# Patient Record
Sex: Male | Born: 1960 | Race: Black or African American | Hispanic: No | Marital: Single | Smoking: Former smoker
Health system: Southern US, Community
[De-identification: ages and names within clinical notes are randomized; demographics above are authoritative.]

## PROBLEM LIST (undated history)

## (undated) DIAGNOSIS — T8859XA Other complications of anesthesia, initial encounter: Secondary | ICD-10-CM

## (undated) DIAGNOSIS — E785 Hyperlipidemia, unspecified: Secondary | ICD-10-CM

## (undated) DIAGNOSIS — E119 Type 2 diabetes mellitus without complications: Secondary | ICD-10-CM

## (undated) DIAGNOSIS — J309 Allergic rhinitis, unspecified: Secondary | ICD-10-CM

## (undated) DIAGNOSIS — I10 Essential (primary) hypertension: Secondary | ICD-10-CM

## (undated) DIAGNOSIS — R0600 Dyspnea, unspecified: Secondary | ICD-10-CM

## (undated) DIAGNOSIS — E669 Obesity, unspecified: Secondary | ICD-10-CM

## (undated) DIAGNOSIS — J45909 Unspecified asthma, uncomplicated: Secondary | ICD-10-CM

## (undated) DIAGNOSIS — G894 Chronic pain syndrome: Principal | ICD-10-CM

## (undated) DIAGNOSIS — K219 Gastro-esophageal reflux disease without esophagitis: Secondary | ICD-10-CM

## (undated) DIAGNOSIS — J449 Chronic obstructive pulmonary disease, unspecified: Secondary | ICD-10-CM

## (undated) DIAGNOSIS — F1910 Other psychoactive substance abuse, uncomplicated: Secondary | ICD-10-CM

## (undated) DIAGNOSIS — M1990 Unspecified osteoarthritis, unspecified site: Secondary | ICD-10-CM

## (undated) HISTORY — DX: Chronic obstructive pulmonary disease, unspecified: J44.9

## (undated) HISTORY — DX: Essential (primary) hypertension: I10

## (undated) HISTORY — DX: Type 2 diabetes mellitus without complications: E11.9

## (undated) HISTORY — DX: Chronic pain syndrome: G89.4

## (undated) HISTORY — PX: PELVIC FRACTURE SURGERY: SHX119

## (undated) HISTORY — DX: Unspecified asthma, uncomplicated: J45.909

## (undated) HISTORY — DX: Obesity, unspecified: E66.9

## (undated) HISTORY — DX: Hyperlipidemia, unspecified: E78.5

## (undated) HISTORY — DX: Allergic rhinitis, unspecified: J30.9

## (undated) HISTORY — DX: Other psychoactive substance abuse, uncomplicated: F19.10

---

## 1989-07-30 HISTORY — PX: CHOLECYSTECTOMY: SHX55

## 2003-10-21 ENCOUNTER — Emergency Department (HOSPITAL_COMMUNITY): Admission: EM | Admit: 2003-10-21 | Discharge: 2003-10-21 | Payer: Self-pay | Admitting: Emergency Medicine

## 2006-04-19 ENCOUNTER — Emergency Department (HOSPITAL_COMMUNITY): Admission: AC | Admit: 2006-04-19 | Discharge: 2006-04-19 | Payer: Self-pay

## 2010-05-08 ENCOUNTER — Emergency Department (HOSPITAL_COMMUNITY): Admission: EM | Admit: 2010-05-08 | Discharge: 2010-05-08 | Payer: Self-pay | Admitting: Emergency Medicine

## 2010-06-06 ENCOUNTER — Emergency Department (HOSPITAL_COMMUNITY): Admission: EM | Admit: 2010-06-06 | Discharge: 2010-06-06 | Payer: Self-pay | Admitting: Emergency Medicine

## 2010-07-24 ENCOUNTER — Emergency Department (HOSPITAL_COMMUNITY)
Admission: EM | Admit: 2010-07-24 | Discharge: 2010-07-25 | Payer: Self-pay | Source: Home / Self Care | Admitting: Emergency Medicine

## 2010-09-25 ENCOUNTER — Emergency Department (HOSPITAL_COMMUNITY): Payer: Self-pay

## 2010-09-25 ENCOUNTER — Inpatient Hospital Stay (HOSPITAL_COMMUNITY)
Admission: EM | Admit: 2010-09-25 | Discharge: 2010-09-28 | DRG: 192 | Disposition: A | Discharge status: Home / Self Care | Payer: Self-pay | Attending: Internal Medicine | Admitting: Internal Medicine

## 2010-09-25 DIAGNOSIS — E663 Overweight: Secondary | ICD-10-CM | POA: Diagnosis present

## 2010-09-25 DIAGNOSIS — J441 Chronic obstructive pulmonary disease with (acute) exacerbation: Principal | ICD-10-CM | POA: Diagnosis present

## 2010-09-25 DIAGNOSIS — R079 Chest pain, unspecified: Secondary | ICD-10-CM

## 2010-09-25 DIAGNOSIS — R030 Elevated blood-pressure reading, without diagnosis of hypertension: Secondary | ICD-10-CM | POA: Diagnosis present

## 2010-09-25 DIAGNOSIS — J40 Bronchitis, not specified as acute or chronic: Secondary | ICD-10-CM

## 2010-09-25 DIAGNOSIS — Z6831 Body mass index (BMI) 31.0-31.9, adult: Secondary | ICD-10-CM

## 2010-09-25 DIAGNOSIS — Z87891 Personal history of nicotine dependence: Secondary | ICD-10-CM

## 2010-09-25 DIAGNOSIS — F1410 Cocaine abuse, uncomplicated: Secondary | ICD-10-CM | POA: Diagnosis present

## 2010-09-25 LAB — RAPID URINE DRUG SCREEN, HOSP PERFORMED
Amphetamines: NOT DETECTED
Barbiturates: NOT DETECTED
Cocaine: POSITIVE — AB
Opiates: NOT DETECTED
Tetrahydrocannabinol: NOT DETECTED

## 2010-09-25 LAB — POCT CARDIAC MARKERS
CKMB, poc: 1.9 ng/mL (ref 1.0–8.0)
Myoglobin, poc: 96 ng/mL (ref 12–200)

## 2010-09-25 LAB — BASIC METABOLIC PANEL
Calcium: 8.6 mg/dL (ref 8.4–10.5)
Creatinine, Ser: 1.21 mg/dL (ref 0.4–1.5)
GFR calc Af Amer: >60 mL/min (ref >60)
GFR calc non Af Amer: >60 mL/min (ref >60)
Sodium: 140 mEq/L (ref 135–145)

## 2010-09-25 LAB — CK TOTAL AND CKMB (NOT AT ARMC)
CK, MB: 3.1 ng/mL (ref 0.3–4.0)
Total CK: 443 U/L — ABNORMAL HIGH (ref 7–232)

## 2010-09-25 LAB — URINALYSIS, ROUTINE W REFLEX MICROSCOPIC
Ketones, ur: 40 mg/dL — AB
Protein, ur: NEGATIVE mg/dL
Urine Glucose, Fasting: NEGATIVE mg/dL
pH: 7 (ref 5.0–8.0)

## 2010-09-25 LAB — DIFFERENTIAL
Basophils Absolute: 0 10*3/uL (ref 0.0–0.1)
Basophils Relative: 0 % (ref 0–1)
Eosinophils Absolute: 0.7 10*3/uL (ref 0.0–0.7)
Eosinophils Relative: 9 % — ABNORMAL HIGH (ref 0–5)
Monocytes Absolute: 0.9 10*3/uL (ref 0.1–1.0)
Neutro Abs: 3.8 10*3/uL (ref 1.7–7.7)

## 2010-09-25 LAB — CBC
Hemoglobin: 16.2 g/dL (ref 13.0–17.0)
MCHC: 33.4 g/dL (ref 30.0–36.0)
Platelets: 272 10*3/uL (ref 150–400)
RDW: 14.1 % (ref 11.5–15.5)

## 2010-09-25 LAB — POCT I-STAT 3, ART BLOOD GAS (G3+)
TCO2: 28 mmol/L (ref 0–100)
pCO2 arterial: 43.9 mmHg (ref 35.0–45.0)
pH, Arterial: 7.386 (ref 7.350–7.450)

## 2010-09-25 LAB — CARDIAC PANEL(CRET KIN+CKTOT+MB+TROPI)
CK, MB: 2.2 ng/mL (ref 0.3–4.0)
Relative Index: 0.6 (ref 0.0–2.5)

## 2010-09-25 LAB — BRAIN NATRIURETIC PEPTIDE: Pro B Natriuretic peptide (BNP): <30 pg/mL (ref 0.0–100.0)

## 2010-09-25 MED ORDER — IOHEXOL 300 MG/ML  SOLN
100.0000 mL | Freq: Once | INTRAMUSCULAR | Status: AC | PRN
  Administered 2010-09-25: 75 mL via INTRAVENOUS

## 2010-09-26 LAB — EXPECTORATED SPUTUM ASSESSMENT W GRAM STAIN, RFLX TO RESP C

## 2010-09-26 LAB — CBC
MCV: 87.5 fL (ref 78.0–100.0)
Platelets: 277 10*3/uL (ref 150–400)
RBC: 5.06 MIL/uL (ref 4.22–5.81)
RDW: 13.8 % (ref 11.5–15.5)
WBC: 7 10*3/uL (ref 4.0–10.5)

## 2010-09-26 LAB — LEGIONELLA ANTIGEN, URINE: Legionella Antigen, Urine: NEGATIVE

## 2010-09-26 LAB — SEDIMENTATION RATE: Sed Rate: 2 mm/hr (ref 0–16)

## 2010-09-26 LAB — BASIC METABOLIC PANEL
BUN: 8 mg/dL (ref 6–23)
CO2: 25 mEq/L (ref 19–32)
Calcium: 8.3 mg/dL — ABNORMAL LOW (ref 8.4–10.5)
Chloride: 106 mEq/L (ref 96–112)
Creatinine, Ser: 1.14 mg/dL (ref 0.4–1.5)
GFR calc Af Amer: >60 mL/min (ref >60)
Glucose, Bld: 150 mg/dL — ABNORMAL HIGH (ref 70–99)

## 2010-09-26 LAB — LIPID PANEL
HDL: 43 mg/dL (ref >39)
LDL Cholesterol: 88 mg/dL (ref 0–99)
Triglycerides: 40 mg/dL (ref <150)

## 2010-09-26 LAB — ANGIOTENSIN CONVERTING ENZYME: Angiotensin-Converting Enzyme: 15 U/L (ref 8–52)

## 2010-09-26 LAB — CARDIAC PANEL(CRET KIN+CKTOT+MB+TROPI)
Total CK: 352 U/L — ABNORMAL HIGH (ref 7–232)
Troponin I: 0.02 ng/mL (ref 0.00–0.06)

## 2010-09-26 LAB — HIV ANTIBODY (ROUTINE TESTING W REFLEX): HIV: NONREACTIVE

## 2010-09-27 DIAGNOSIS — I517 Cardiomegaly: Secondary | ICD-10-CM

## 2010-09-27 LAB — BASIC METABOLIC PANEL
CO2: 27 mEq/L (ref 19–32)
Chloride: 107 mEq/L (ref 96–112)
Creatinine, Ser: 1.12 mg/dL (ref 0.4–1.5)
GFR calc Af Amer: >60 mL/min (ref >60)
Potassium: 3.6 mEq/L (ref 3.5–5.1)

## 2010-09-27 LAB — CBC
Hemoglobin: 13.4 g/dL (ref 13.0–17.0)
MCH: 28.8 pg (ref 26.0–34.0)
MCV: 88.6 fL (ref 78.0–100.0)
Platelets: 288 10*3/uL (ref 150–400)
RBC: 4.66 MIL/uL (ref 4.22–5.81)
WBC: 14.1 10*3/uL — ABNORMAL HIGH (ref 4.0–10.5)

## 2010-09-28 DIAGNOSIS — R079 Chest pain, unspecified: Secondary | ICD-10-CM

## 2010-09-28 DIAGNOSIS — J40 Bronchitis, not specified as acute or chronic: Secondary | ICD-10-CM

## 2010-09-28 LAB — ANTI-NEUTROPHIL ANTIBODY

## 2010-09-28 LAB — T4, FREE: Free T4: 0.78 ng/dL — ABNORMAL LOW (ref 0.80–1.80)

## 2010-10-08 NOTE — Discharge Summary (Signed)
[name], [name] NO.:  [id number]  MEDICAL RECORD NO.:  [id number]           PATIENT TYPE:  E  LOCATION:  [facility]                         FACILITY:  [facility]  PHYSICIAN:  Doneen Poisson, MD     DATE OF BIRTH:  07/11/1961  DATE OF ADMISSION:  09/25/2010 DATE OF DISCHARGE:  09/28/2010                              DISCHARGE SUMMARY   DISCHARGE DIAGNOSES: 1. Presumed chronic obstructive pulmonary disease exacerbation. 2. Overweight BMI 31. 3. Substance abuse, cocaine. 4. Borderline hypertension.  DISCHARGE MEDICATIONS: 1. AeroChamber InspirEase. 2. Albuterol 2.5 mg per 3 mL nebulizer solution q.4 h. as needed. 3. Aspirin 81 mg daily by mouth. 4. Zithromax 250 mg p.o. daily for 2 days. 5. Atrovent 0.2 mcg/mL inhaled every 6 hours. 6. Prednisone taper 4 pills daily for 1 day; then 3 tablets on day 2     through 4; 2 tablets day 5 through 7, and one tablet on day 8     through 11.  DISPOSITION AND FOLLOWUP:  [name] was discharged from [facility] in stable and improved condition.  He will follow up with Dr. Threasa Beards on October 21, 2010, at 10:15 a.m. in the [facility].  He should have a pulmonary function test scheduled and had his respiratory status reassess for further cocaine or cigarette  use.  CONSULTS: Cardiology.  [name] had chest pain, T wave changes, and a  troponin leak.  The Cardiology thought this is secondary to cocaine and  recommended medical management alone without further workup.  PROCEDURES: 1. Chest x-ray 2-view.  Impression:  Chronic interstitial coarsening     with no acute findings. 2. CT angiogram.  No evidence of PE, bilateral peribronchial     thickening and shotty bilateral hilar and mediastinal lymph nodes     likely reactive etiology. 3. Echo.  [name] had mild LVH, mild grade 2 diastolic     dysfunction, and had an EF of 60% to 65%.  ADMISSION HISTORY AND PHYSICAL:  [name] is a 50 year old  African American man without significant medical history who presented with a 4- month history of shortness of breath, cough and chest pain that started after the death of his mother and cleaning her house which was full of  mold and dust.  The night prior to admission, he could not sleep while  lying flat secondary to shortness of breath and needed to sleep upright. He stated that he coughs up yellow sputum which sometimes was or had  streaks of blood.  He felt feverish with a 99 degree temperature whenever  he had a coughing spell.  Chest pain was associated with coughing.   Quality of the chest pain was pressure like in nature located at the mid  sternum and radiating to the lower back and right arm as a 9/10 pain.   There were no exacerbating or alleviating factors.  He endorsed nausea,  vomiting, and diaphoresis when he had the chest pain.  He denied any recent travels or sick contacts.  He reports going to the emergency  department  several times in the past few months and was given an inhaler which helped his shortness of breath.  ADMISSION PHYSICAL EXAMINATION: VITAL SIGNS:  Temperature 98.6, blood pressure 130/84, pulse 89,  respiratory rate 22, and oxygen 98% on room air. GENERAL:  No acute distress. EYES:  Pupils equally round and reactive to light.  Extraocular movements intact. NECK:  No lymphadenopathy. RESPIRATIONS:  Mild diffuse wheezes bilaterally, coarse breath sounds. No axillary muscle usage or crackles. CARDIOVASCULAR:  Distant heart sounds.  No murmurs, rubs, or gallops. GI:  Soft, nontender, nondistended.  Normoactive bowel sounds. EXTREMITIES:  No edema or cyanosis. SKIN:  No rash. NEURO:  Nonfocal.  Alert and oriented x 3.  Motor 5/5 in all extremities. Intact sensation to light touch. PSYCH:  Appropriate.  ADMISSION LABS:  CK 443, CK-MB 3.1, troponin 0.14 trended to 0.3.  BNP  less than 30.  ABG, pH 7.6, PCO2 44, PO2 69, bicarb 26.  Ethanol less  than 5.   UA positive ketones, trace blood.  UDS, positive cocaine.   BMET 143, 107, 24, 9, 1.21, 89.  CBC, 8.0, 16.2, 49, 272.  HOSPITAL COURSE BY PROBLEM: 1. Dyspnea.  [name] had an increase in shortness of breath on     admission.  He was given between 2 and 3 liters oxygen by     nasal cannula, which kept his oxygen saturation above 93%.  Due      to the initial concern for PE considering his chest pain, a CT      angiogram was ordered and showed shotty hilar or mediastinal     lymphadenopathy.  A chest x-ray showed some coarse interstitial     thickening.  He was started on albuterol and Atrovent nebulizer.       On hospital day 2, he was started on  Advair.  Initially, he      received Solu-Medrol and on hospital day 3, was changed to     a prednisone taper.  He felt he was improved and was active,      walking around the ward without oxygen.  Per the nurse, when      walking, his oxygen saturation did not drop below 93%.  This      condition is likely secondary to COPD given his 30-pack-year     history.  Of note, his brother died of complications of a      double lung transplant for sarcoidosis at the age of 32.  2. Cough.  [name] had severe cough upon entering the emergency     department.  He was provided dexamethorphan as needed. He     had no more "cough attacks" after hospital day 1.  3. Chest pain.  [name] initially had T wave inversion in leads     II, III and aVF as well as V4 through 6.  He had a classic     angina history, and had a troponin leak, although this     was in the setting of a cocaine positive urinary drug screen.     Considering this we ordered an echo which found no ischemic     changes, mild grade 2 diastolic dysfunction, mild LVH and an      EF of 60% to 65%.  He had no chest pain after admission.  4. Substance abuse.  [name] continues to deny use of cocaine.     This is in the face of a positive urinary drug screen  for cocaine.     He does state that he  hangs around friends who smoke.  He     was provided substance abuse counseling on day 2.  DISCHARGE VITAL SIGNS:  T 98.3, blood pressure 122/61, pulse 84,  respirations 20, O2 sat 95% on room air.   ______________________________ Antoine Primas, MS IV   ______________________________ Doneen Poisson, MD   SZ/MEDQ  D:  09/28/2010  T:  09/29/2010  Job:  [id number]  Electronically Signed by Darnelle Maffucci  on 10/01/2010 03:35:15 PM Electronically Signed by Doneen Poisson  on 10/08/2010 08:25:02 AM

## 2010-10-11 LAB — BASIC METABOLIC PANEL
Chloride: 105 mEq/L (ref 96–112)
GFR calc non Af Amer: 56 mL/min — ABNORMAL LOW (ref >60)
Potassium: 3.7 mEq/L (ref 3.5–5.1)
Sodium: 140 mEq/L (ref 135–145)

## 2010-10-11 LAB — CBC
HCT: 45.8 % (ref 39.0–52.0)
Hemoglobin: 14.7 g/dL (ref 13.0–17.0)
MCV: 90.7 fL (ref 78.0–100.0)
RBC: 5.05 MIL/uL (ref 4.22–5.81)
RDW: 13.8 % (ref 11.5–15.5)
WBC: 7.4 10*3/uL (ref 4.0–10.5)

## 2010-10-11 LAB — DIFFERENTIAL
Basophils Absolute: 0 10*3/uL (ref 0.0–0.1)
Eosinophils Relative: 5 % (ref 0–5)
Lymphocytes Relative: 65 % — ABNORMAL HIGH (ref 12–46)
Lymphs Abs: 4.8 10*3/uL — ABNORMAL HIGH (ref 0.7–4.0)
Monocytes Absolute: 0.5 10*3/uL (ref 0.1–1.0)
Monocytes Relative: 7 % (ref 3–12)
Neutro Abs: 1.7 10*3/uL (ref 1.7–7.7)

## 2010-10-12 LAB — URINALYSIS, ROUTINE W REFLEX MICROSCOPIC
Glucose, UA: NEGATIVE mg/dL
Hgb urine dipstick: NEGATIVE
Ketones, ur: 15 mg/dL — AB
Nitrite: NEGATIVE
Protein, ur: NEGATIVE mg/dL
Specific Gravity, Urine: 1.025 (ref 1.005–1.030)
Urobilinogen, UA: 0.2 mg/dL (ref 0.0–1.0)
pH: 6 (ref 5.0–8.0)

## 2010-10-12 LAB — BASIC METABOLIC PANEL WITH GFR
Calcium: 8.5 mg/dL (ref 8.4–10.5)
Creatinine, Ser: 1.18 mg/dL (ref 0.4–1.5)
GFR calc Af Amer: >60 mL/min (ref >60)
GFR calc non Af Amer: >60 mL/min (ref >60)
Glucose, Bld: 109 mg/dL — ABNORMAL HIGH (ref 70–99)
Sodium: 139 meq/L (ref 135–145)

## 2010-10-12 LAB — DIFFERENTIAL
Basophils Absolute: 0 10*3/uL (ref 0.0–0.1)
Basophils Relative: 0 % (ref 0–1)
Eosinophils Absolute: 0.5 10*3/uL (ref 0.0–0.7)
Eosinophils Relative: 5 % (ref 0–5)
Lymphocytes Relative: 28 % (ref 12–46)
Lymphs Abs: 2.6 10*3/uL (ref 0.7–4.0)
Monocytes Absolute: 1 10*3/uL (ref 0.1–1.0)
Monocytes Relative: 11 % (ref 3–12)
Neutro Abs: 5.3 K/uL (ref 1.7–7.7)
Neutrophils Relative %: 56 % (ref 43–77)

## 2010-10-12 LAB — CBC
HCT: 46 % (ref 39.0–52.0)
Hemoglobin: 15 g/dL (ref 13.0–17.0)
MCH: 29.9 pg (ref 26.0–34.0)
MCHC: 32.6 g/dL (ref 30.0–36.0)
MCV: 91.8 fL (ref 78.0–100.0)
Platelets: 277 K/uL (ref 150–400)
RBC: 5.01 MIL/uL (ref 4.22–5.81)
RDW: 14.1 % (ref 11.5–15.5)
WBC: 9.5 10*3/uL (ref 4.0–10.5)

## 2010-10-12 LAB — BASIC METABOLIC PANEL
BUN: 10 mg/dL (ref 6–23)
CO2: 29 mEq/L (ref 19–32)
Chloride: 106 mEq/L (ref 96–112)
Potassium: 3.5 mEq/L (ref 3.5–5.1)

## 2010-10-21 ENCOUNTER — Ambulatory Visit (INDEPENDENT_AMBULATORY_CARE_PROVIDER_SITE_OTHER): Payer: Self-pay | Admitting: Internal Medicine

## 2010-10-21 ENCOUNTER — Ambulatory Visit: Payer: Self-pay | Admitting: Licensed Clinical Social Worker

## 2010-10-21 ENCOUNTER — Encounter: Payer: Self-pay | Admitting: Internal Medicine

## 2010-10-21 DIAGNOSIS — J45909 Unspecified asthma, uncomplicated: Secondary | ICD-10-CM | POA: Insufficient documentation

## 2010-10-21 DIAGNOSIS — E669 Obesity, unspecified: Secondary | ICD-10-CM

## 2010-10-21 DIAGNOSIS — F1910 Other psychoactive substance abuse, uncomplicated: Secondary | ICD-10-CM

## 2010-10-21 DIAGNOSIS — J449 Chronic obstructive pulmonary disease, unspecified: Secondary | ICD-10-CM

## 2010-10-21 DIAGNOSIS — Z598 Other problems related to housing and economic circumstances: Secondary | ICD-10-CM

## 2010-10-21 DIAGNOSIS — I10 Essential (primary) hypertension: Secondary | ICD-10-CM | POA: Insufficient documentation

## 2010-10-21 HISTORY — DX: Unspecified asthma, uncomplicated: J45.909

## 2010-10-21 MED ORDER — IPRATROPIUM BROMIDE HFA 17 MCG/ACT IN AERS: 2.0000 | INHALATION_SPRAY | Freq: Four times a day (QID) | RESPIRATORY_TRACT | Status: DC

## 2010-10-21 MED ORDER — ALBUTEROL SULFATE (2.5 MG/3ML) 0.083% IN NEBU: 2.5000 mg | INHALATION_SOLUTION | RESPIRATORY_TRACT | Status: DC | PRN

## 2010-10-21 NOTE — Assessment & Plan Note (Signed)
Has not used smoking or cocaine after discharge and provided counseling as well as Secretary/administrator.

## 2010-10-21 NOTE — Progress Notes (Signed)
  Subjective:    Patient ID: [name], male    DOB: 1960-08-29, 50 y.o.   MRN: [id number]  HPI Patient is a 50 years old male with past medical history  as outlined here who comes to the Clinic for recent hospital f/u. He was admitted to hospital on 2/25-2/28/2012 for COPD exacerbation. After discharge he has been doing well, had only one episode of dyspnea lasting about 30 minutes. Has no fever, chill, chest pain, hemoptysis, abdominal pain, nausea, vomiting, diarrhea, melena, dysuria, significant weight change. Has quitted smoking and cocaine use. But haven't got bronchodilators for financial reason.     Review of Systems Per HPI.  Current Outpatient Medications Current Outpatient Prescriptions  Medication Sig Dispense Refill  . aspirin 81 MG tablet Take 81 mg by mouth daily.          Allergies Review of patient's allergies indicates no known allergies.  Past Medical History  Diagnosis Date  . COPD (chronic obstructive pulmonary disease)   . Obesity (BMI 30-39.9)   . Substance abuse     cocaine    No past surgical history on file.     Objective:   Physical Exam General: Vital signs reviewed and noted. Well-developed,well-nourished,in no acute distress; alert,appropriate and cooperative throughout examination. Head: normocephalic, atraumatic. Neck: No deformities, masses, or tenderness noted. Lungs: Normal respiratory effort. Clear to auscultation BL without crackles or wheezes.  Heart: RRR. S1 and S2 normal without gallop, murmur, or rubs.  Abdomen: BS normoactive. Soft, Nondistended, non-tender.  No masses or organomegaly. Extremities: No pretibial edema.         Assessment & Plan:

## 2010-10-21 NOTE — Patient Instructions (Signed)
Please take all your medications as instructed in your instructions.   Please talk our social worker after this visit for your financial help.  Please stop smoking and cocaine.   Smoking Cessation This document explains the best ways for you to quit smoking and new treatments to help. It lists new medicines that can double or triple your chances of quitting and quitting for good. It also considers ways to avoid relapses and concerns you may have about quitting, including weight gain. NICOTINE: A POWERFUL ADDICTION If you have tried to quit smoking, you know how hard it can be. It is hard because nicotine is a very addictive drug. For some people, it can be as addictive as heroin or cocaine. Usually, people make 2 or 3 tries, or more, before finally being able to quit. Each time you try to quit, you can learn about what helps and what hurts. Quitting takes hard work and a lot of effort, but you can quit smoking. QUITTING SMOKING IS ONE OF THE MOST IMPORTANT THINGS YOU WILL EVER DO:  You will live longer, feel better, and live better.   The impact on your body of quitting smoking is felt almost immediately:   Within 20 minutes, blood pressure decreases. Pulse returns to its normal level.   After 8 hours, carbon monoxide levels in the blood return to normal. Oxygen level increases.   After 24 hours, chance of heart attack starts to decrease. Breath, hair, and body stop smelling like smoke.   After 48 hours, damaged nerve endings begin to recover. Sense of taste and smell improve.   After 72 hours, the body is virtually free of nicotine. Bronchial tubes relax and breathing becomes easier.   After 2 to 12 weeks, lungs can hold more air. Exercise becomes easier and circulation improves.   Quitting will lower your chance of having a heart attack, stroke, cancer, or lung disease:   After 1 year, the risk of coronary heart disease is cut in half.   After 5 years, the risk of stroke falls to  the same as a nonsmoker.   After 10 years, the risk of lung cancer is cut in half and the risk of other cancers decreases significantly.   After 15 years, the risk of coronary heart disease drops, usually to the level of a nonsmoker.   If you are pregnant, quitting smoking will improve your chances of having a healthy baby.   The people you live with, especially your children, will be healthier.   You will have extra money to spend on things other than cigarettes.  FIVE KEYS TO QUITTING Studies have shown that these 5 steps will help you quit smoking and quit for good. You have the best chances of quitting if you use them together: 1. Get ready.  2. Get support and encouragement.  3. Learn new skills and behaviors.  4. Get medicine to reduce your nicotine addiction and use it correctly.  5. Be prepared for relapse or difficult situations. Be determined to continue trying to quit, even if you do not succeed at first.  1. GET READY  Set a quit date.   Change your environment.   Get rid of ALL cigarettes, ashtrays, matches, and lighters in your home, car, and place of work.   Do not let people smoke in your home.   Review your past attempts to quit. Think about what worked and what did not.   Once you quit, do not smoke. NOT EVEN A  PUFF!  2. GET SUPPORT AND ENCOURAGEMENT Studies have shown that you have a better chance of being successful if you have help. You can get support in many ways.  Tell your family, friends, and coworkers that you are going to quit and need their support. Ask them not to smoke around you.   Talk to your caregivers (doctor, dentist, nurse, pharmacist, psychologist, and/or smoking counselor).   Get individual, group, or telephone counseling and support. The more counseling you have, the better your chances are of quitting. Programs are available at [employer] and health centers. Call your local health department for information about programs in your  area.   Spiritual beliefs and practices may help some smokers quit.   Quit meters are Photographer that keep track of quit statistics, such as amount of "quit-time," cigarettes not smoked, and money saved.   Many smokers find one or more of the many self-help books available useful in helping them quit and stay off tobacco.  3. LEARN NEW SKILLS AND BEHAVIORS  Try to distract yourself from urges to smoke. Talk to someone, go for a walk, or occupy your time with a task.   When you first try to quit, change your routine. Take a different route to work. Drink tea instead of coffee. Eat breakfast in a different place.   Do something to reduce your stress. Take a hot bath, exercise, or read a book.   Plan something enjoyable to do every day. Reward yourself for not smoking.   Explore interactive web-based programs that specialize in helping you quit.  4. GET MEDICINE AND USE IT CORRECTLY Medicines can help you stop smoking and decrease the urge to smoke. Combining medicine with the above behavioral methods and support can quadruple your chances of successfully quitting smoking. The U.[place]. Food and Drug Administration (FDA) has approved 7 medicines to help you quit smoking. These medicines fall into 3 categories.  Nicotine replacement therapy (delivers nicotine to your body without the negative effects and risks of smoking):   Nicotine gum: Available over-the-counter.   Nicotine lozenges: Available over-the-counter.   Nicotine inhaler: Available by prescription.   Nicotine nasal spray: Available by prescription.   Nicotine skin patches (transdermal): Available by prescription and over-the-counter.   Antidepressant medicine (helps people abstain from smoking, but how this works is unknown):   Bupropion sustained-release (SR) tablets: Available by prescription.   Nicotinic receptor partial agonist (simulates the effect of nicotine in your brain):    Varenicline tartrate tablets: Available by prescription.   Ask your caregiver for advice about which medicines to use and how to use them. Carefully read the information on the package.   Everyone who is trying to quit may benefit from using a medicine. If you are pregnant or trying to become pregnant, nursing an infant, you are under age 66, or you smoke fewer than 10 cigarettes per day, talk to your caregiver before taking any nicotine replacement medicines.   You should stop using a nicotine replacement product and call your caregiver if you experience nausea, dizziness, weakness, vomiting, fast or irregular heartbeat, mouth problems with the lozenge or gum, or redness or swelling of the skin around the patch that does not go away.   Do not use any other product containing nicotine while using a nicotine replacement product.   Talk to your caregiver before using these products if you have diabetes, heart disease, asthma, stomach ulcers, you had a recent heart attack, you have  high blood pressure that is not controlled with medicine, a history of irregular heartbeat, or you have been prescribed medicine to help you quit smoking.  5. BE PREPARED FOR RELAPSE OR DIFFICULT SITUATIONS  Most relapses occur within the first 3 months after quitting. Do not be discouraged if you start smoking again. Remember, most people try several times before they finally quit.   You may have symptoms of withdrawal because your body is used to nicotine. You may crave cigarettes, be irritable, feel very hungry, cough often, get headaches, or have difficulty concentrating.   The withdrawal symptoms are only temporary. They are strongest when you first quit, but they will go away within 10 to 14 days.  Here are some difficult situations to watch for:  Alcohol. Avoid drinking alcohol. Drinking lowers your chances of successfully quitting.   Caffeine. Try to reduce the amount of caffeine you consume. It also lowers  your chances of successfully quitting.   Other smokers. Being around smoking can make you want to smoke. Avoid smokers.   Weight gain. Many smokers will gain weight when they quit, usually less than 10 pounds. Eat a healthy diet and stay active. Do not let weight gain distract you from your main goal, quitting smoking. Some medicines that help you quit smoking may also help delay weight gain. You can always lose the weight gained after you quit.   Bad mood or depression. There are a lot of ways to improve your mood other than smoking.  If you are having problems with any of these situations, talk to your caregiver. SPECIAL SITUATIONS OR CONDITIONS Studies suggest that everyone can quit smoking. Your situation or condition can give you a special reason to quit.  Pregnant women/New mothers: By quitting, you protect your baby's health and your own.   Hospitalized patients: By quitting, you reduce health problems and help healing.   Heart attack patients: By quitting, you reduce your risk of a second heart attack.   Lung, head, and neck cancer patients: By quitting, you reduce your chance of a second cancer.   Parents of children and adolescents: By quitting, you protect your children from illnesses caused by secondhand smoke.  QUESTIONS TO THINK ABOUT Think about the following questions before you try to stop smoking. You may want to talk about your answers with your caregiver.  Why do you want to quit?   If you tried to quit in the past, what helped and what did not?   What will be the most difficult situations for you after you quit? How will you plan to handle them?   Who can help you through the tough times? Your family? Friends? Caregiver?   What pleasures do you get from smoking? What ways can you still get pleasure if you quit?  Here are some questions to ask your caregiver:  How can you help me to be successful at quitting?   What medicine do you think would be best for me  and how should I take it?   What should I do if I need more help?   What is smoking withdrawal like? How can I get information on withdrawal?  Quitting takes hard work and a lot of effort, but you can quit smoking. FOR MORE INFORMATION Smokefree.gov (http://www.davis-sullivan.com/) provides free, accurate, evidence-based information and professional assistance to help support the immediate and long-term needs of people trying to quit smoking. Document Released: 07/12/2001 Document Re-Released: 01/05/2010 [facility] Patient Information 2011 [place], [place].

## 2010-10-21 NOTE — Assessment & Plan Note (Addendum)
Has only one episode of SOB, not using bronchodilators for financial cost. Will refer to CSW for this and give him one sample of albuterol. No change his medications.

## 2010-10-21 NOTE — Progress Notes (Signed)
Soc. Work.  30 minutes.  Referred by Dr. Threasa Beards for med assist, inhalors. Met with [name] in my office and find a very pleasant gentleman who lives alone in a home that was once his deceased mother's.  He has not worked in 6 years but his sister allows him to live in the home rent free and he also receives Nature conservation officer.   He received a ride from his uncle today for this appointment.   I explained to [name] that he needed to connect with [employer] today for sliding scale discount, orange card and county card.  I also gave him information about MAP which he tried to access before getting Dr. Threasa Beards as primary care physician.  He will try again now that he is established here in [facility].    I walked the patient over to [facility] and signed him in so he can consult with her and get started with the financial process.

## 2010-11-10 ENCOUNTER — Inpatient Hospital Stay (INDEPENDENT_AMBULATORY_CARE_PROVIDER_SITE_OTHER)
Admission: RE | Admit: 2010-11-10 | Discharge: 2010-11-10 | Disposition: A | Discharge status: Home / Self Care | Payer: Self-pay | Source: Ambulatory Visit | Attending: Emergency Medicine | Admitting: Emergency Medicine

## 2010-11-10 DIAGNOSIS — J45909 Unspecified asthma, uncomplicated: Secondary | ICD-10-CM

## 2010-11-22 ENCOUNTER — Encounter (HOSPITAL_COMMUNITY): Payer: Self-pay | Admitting: Radiology

## 2010-11-22 ENCOUNTER — Emergency Department (HOSPITAL_COMMUNITY)
Admission: EM | Admit: 2010-11-22 | Discharge: 2010-11-22 | Disposition: A | Discharge status: Home / Self Care | Payer: Self-pay | Attending: Emergency Medicine | Admitting: Emergency Medicine

## 2010-11-22 ENCOUNTER — Emergency Department (HOSPITAL_COMMUNITY): Payer: Self-pay

## 2010-11-22 ENCOUNTER — Encounter: Payer: Self-pay | Admitting: Internal Medicine

## 2010-11-22 ENCOUNTER — Inpatient Hospital Stay (HOSPITAL_COMMUNITY)
Admission: EM | Admit: 2010-11-22 | Discharge: 2010-11-24 | DRG: 192 | Disposition: A | Discharge status: Home / Self Care | Payer: Self-pay | Attending: Infectious Diseases | Admitting: Infectious Diseases

## 2010-11-22 DIAGNOSIS — J189 Pneumonia, unspecified organism: Secondary | ICD-10-CM | POA: Insufficient documentation

## 2010-11-22 DIAGNOSIS — J441 Chronic obstructive pulmonary disease with (acute) exacerbation: Principal | ICD-10-CM | POA: Diagnosis present

## 2010-11-22 DIAGNOSIS — J45909 Unspecified asthma, uncomplicated: Secondary | ICD-10-CM | POA: Insufficient documentation

## 2010-11-22 DIAGNOSIS — Z23 Encounter for immunization: Secondary | ICD-10-CM

## 2010-11-22 DIAGNOSIS — Z7982 Long term (current) use of aspirin: Secondary | ICD-10-CM

## 2010-11-22 DIAGNOSIS — R509 Fever, unspecified: Secondary | ICD-10-CM | POA: Insufficient documentation

## 2010-11-22 LAB — DIFFERENTIAL
Eosinophils Relative: 0 % (ref 0–5)
Lymphocytes Relative: 16 % (ref 12–46)
Lymphs Abs: 2 10*3/uL (ref 0.7–4.0)
Monocytes Absolute: 1.5 10*3/uL — ABNORMAL HIGH (ref 0.1–1.0)

## 2010-11-22 LAB — CBC
HCT: 41.5 % (ref 39.0–52.0)
MCH: 29.8 pg (ref 26.0–34.0)
MCHC: 33.5 g/dL (ref 30.0–36.0)
MCV: 89.1 fL (ref 78.0–100.0)
RDW: 14 % (ref 11.5–15.5)

## 2010-11-22 LAB — RAPID URINE DRUG SCREEN, HOSP PERFORMED
Amphetamines: NOT DETECTED
Barbiturates: NOT DETECTED
Benzodiazepines: NOT DETECTED
Opiates: NOT DETECTED

## 2010-11-22 LAB — BASIC METABOLIC PANEL
BUN: 9 mg/dL (ref 6–23)
Chloride: 111 mEq/L (ref 96–112)
GFR calc Af Amer: >60 mL/min (ref >60)
Potassium: 4 mEq/L (ref 3.5–5.1)

## 2010-11-22 NOTE — H&P (Signed)
Hospital Admission Note Date: 11/22/2010  Patient name: [name] Medical record number: [id number] Date of birth: 1961/07/08 Age: 50 y.o. Gender: male PCP: Jackson Latino, MD  Medical Service: Internal Medicine Teaching Service  Attending physician:  Dr. Lina Sayre   Pager: Resident [phone]): Dr. Scot Dock    Pager: [phone] Resident (R1): Dr. Allena Katz    Pager: [phone]  Chief Complaint:coughing  History of Present Illness: [name] is a 29 yoman without significant medical history who presented with a 6-   month history of shortness of breath, cough.  He was recently admitted for a COPD exacerbation on 09/25/2010.  After hospital discharge, he completed his 5 day course of Azithromax and prednisone tapering dose.  He reports that he couldn't not afford his inhalers because there was a problem with paperworks for the orange card.  He has cough that is intermittent, and that the day prior to admission, his cough and SOB became worse so he went to [facility] ED yesterday 11/21/10, was given IV abx and breathing treatment and then was sent home. He coughs up yellow phlegm without any blood.  He has chills but denies any fever, measure temp 99.7  He denies any   recent travels or sick contacts.  Admits to nausea but denies any vomiting.  He was given a prescription for azithromycin from [facility], however due to financial strain was unable to fill it. Patient states that he has an appt with [facility] dept for registration this Thursday. He denies headache, changes in urinary or bowel habits.   Current Outpatient Prescriptions  Medication Sig Dispense Refill  . albuterol (PROVENTIL) (2.5 MG/3ML) 0.083% nebulizer solution Take 3 mLs (2.5 mg total) by nebulization every 4 (four) hours as needed for wheezing.  25 vial  2  . aspirin 81 MG tablet Take 81 mg by mouth daily.        Marland Kitchen ipratropium (ATROVENT HFA) 17 MCG/ACT inhaler Inhale 2 puffs into the lungs 4 (four) times daily.  1 Inhaler  2     Allergies: Review of patient's allergies indicates no known allergies.  Past Medical History  Diagnosis Date  . COPD (chronic obstructive pulmonary disease) no PFTs   . Substance abuse     cocaine    Past surgical history: none  Family history: Mother passed from pancreatic cancer August 2011 Father: alcoholism Brother: lungs transplantation, now deceased Sister: Alive and healthy   Social History: single, staying by himself at his mother's house in which he inherited, Received GED, former Investment banker, operational.  Currently applying for disability.   Smoke 1ppdx20 years, drinks alcohol on the weekend- 2-3 beers, reports last cocaine use was 7 months ago (however, UDS was positive for cocaine in 09/2010).     Review of Systems: As per HPI   Physical Exam:  Vitals:   T98.8    BP140/81  HR 115  RR28   O2sat:84% RA  GEN: NAD, well-developed, normal speech. HEAD: NCAT. EYES: PERRL, EOMI, no icterus or pallor. THROAT: MMM.  NECK: Supple. No JVD or lymphadenopathy. LUNGS: mild occasional expiratory wheezes, decrease breath sounds bilaterally at the bases, no crackles or rhonchi GUR:KYHCWCB rate, regular rthymth. No murmur/rubs/gallops ABD: Soft, nondistended, nontender. Normal bowel sounds. No palpable masses.  EXTREMITIES: No clubbing, cyanosis, or trace edema NEURO:  Alert & oriented x3.  CN II-XII grossly intact. 5/5 motor strength upper and lower extremities, +2 DTR, intact sensation to light tough LYMPH: No lymphadenopathy. SKIN: No rash or lesions.   Lab results:  WBC                                      12.1       h      4.0-10.5         K/uL  RBC                                      4.66              4.22-5.81        MIL/uL  Hemoglobin (HGB)                         13.9              13.0-17.0        g/dL  Hematocrit (HCT)                         41.5              39.0-52.0        %  MCV                                      89.1              78.0-100.0       fL  MCH -                                     29.8              26.0-34.0        pg  MCHC                                     33.5              30.0-36.0        g/dL  RDW                                      14.0              11.5-15.5        %  Platelet Count (PLT)                     298               150-400          K/uL  Neutrophils, %                           72                43-77            %  Lymphocytes, %  16                12-46            %  Monocytes, %                             12                3-12             %  Eosinophils, %                           0                 0-5              %  Basophils, %                             0                 0-1              %  Neutrophils, Absolute                    8.7        h      1.7-7.7          K/uL  Lymphocytes, Absolute                    2.0               0.7-4.0          K/uL  Monocytes, Absolute                      1.5        h      0.1-1.0          K/uL  Eosinophils, Absolute                    0.0               0.0-0.7          K/uL  Basophils, Absolute                      0.0               0.0-0.1          K/uL   Sodium (NA)                              141               135-145          mEq/L  Potassium (K)                            4.0               3.5-5.1          mEq/L  Chloride  111               96-112           mEq/L  CO2                                      24                19-32            mEq/L  Glucose                                  86                70-99            mg/dL  BUN                                      9                 6-23             mg/dL  Creatinine                               1.18              0.4-1.5          mg/dL  GFR, Est Non African American            >60               >60              mL/min  GFR, Est African American                >60               >60              mL/min    Oversized comment, see footnote  1  Calcium                                   8.8               8.4-10.5         mg/dL  Imaging results:  CXR: Likely mild left lower lobe pneumonia.   Assessment & Plan by Problem: (1) COPD exacerbation: bacterial vs. Viral vs. Medication non-compliance.  Unlikely PE as symptoms are insidious in onset, and O2 Sats improved with neb treatments and nasal cannula oxygen. Furthermore,  Wells/Geneva score is 0 with a low probability of having a PE.  Chest Xray appears unchanged from 2/25, the LLL is likely represents chronic scarring as reported on previous CXR.  His social/financial problem is preventing him from getting his maintenance COPD medications; therefore, it is essential to get social worker involved so that he gets his orange card. Cocaine use can also cause bronchospasm and exacerbate his symptoms.   Plan: -Will admit to regular bed for in-patient -Start Duonebs q4hr & solumedrol 125mg  IV q8hr -Repeat 2view CXR in AM -Get 12 lead EKG -Send  for Urine legionella, strep Ag, sputum gram stain and culture -Start Avelox 400mg  IV qd -Mucinex -UDS -Patient is already scheduled for outpatient PFTs   (2) Polysubstance abuse: hx of positive cocaine in 09/2010.   -Get UDS -Substance abuse counseling    R1, Michele Ho_______________________[phone]  Kaylyn Layer, Mona Sidhu______________________[phone]

## 2010-11-22 NOTE — H&P (Signed)
Hospital Admission Note Date: 11/22/2010  Patient name: DANIELE YANKOWSKI Medical record number: [id number] Date of birth: Dec 18, 1960 Age: 50 y.o. Gender: male PCP: Jackson Latino, MD  Medical Service: Internal Medicine Teaching Service  Attending physician:  Dr. Lina Sayre   Pager: Resident [phone]): Dr. Scot Dock    Pager: [phone] Resident (R1): Dr. Allena Katz    Pager: [phone]  Chief Complaint:coughing  History of Present Illness: Mr. Tetreault is a 33 yoman without significant medical history who presented with a 6-   month history of shortness of breath, cough.  He was recently admitted for a COPD exacerbation on 09/25/2010.  After hospital discharge, he completed his 5 day course of Azithromax and prednisone tapering dose.  He reports that he couldn't not afford his inhalers because there was a problem with paperworks for the orange card.  He has cough that is intermittent, and that the day prior to admission, his cough and SOB became worse so he went to [facility] ED yesterday 11/21/10, was given IV abx and breathing treatment and then was sent home. He coughs up yellow phlegm without any blood.  He has chills but denies any fever, measure temp 99.7  He has chestpain that is associated with coughing.  He denies any   recent travels or sick contacts.  Admits to nausea but denies any vomiting.    Current Outpatient Prescriptions  Medication Sig Dispense Refill  . albuterol (PROVENTIL) (2.5 MG/3ML) 0.083% nebulizer solution Take 3 mLs (2.5 mg total) by nebulization every 4 (four) hours as needed for wheezing.  25 vial  2  . aspirin 81 MG tablet Take 81 mg by mouth daily.        Marland Kitchen ipratropium (ATROVENT HFA) 17 MCG/ACT inhaler Inhale 2 puffs into the lungs 4 (four) times daily.  1 Inhaler  2    Allergies: Review of patient's allergies indicates no known allergies.  Past Medical History  Diagnosis Date  . COPD (chronic obstructive pulmonary disease)   . Obesity (BMI 30-39.9)   . Substance abuse     cocaine      Past surgical history: none  Family history: Mother passed from pancreatic cancer Father: alcoholism Brother: lungs transplantation   Social History: single, staying by himself at his mother's house in which he inherited, Received GED, former Investment banker, operational.  Currently applying for disability.   Smoke 1ppdx20 years, drinks alcohol on the weekend- 2-3 beers, reports last cocaine use was 7 months ago (however, UDS was positive for cocaine in 09/2010).     Review of Systems: As per HPI   Physical Exam:  Vitals: T  BP HR RR O2sat: GEN: NAD, well-developed, normal speech. HEAD: NCAT. EYES: PERRL, EOMI, no icterus or pallor. THROAT: MMM.  NECK: Supple. No JVD or lymphadenopathy. LUNGS: mild occasional expiratory wheezes, decrease breath sounds bilaterally at the bases, no crackles or rhonchi YQM:VHQIONG rate, regular rthymth. No murmur/rubs/gallops ABD: Soft, nondistended, nontender. Normal bowel sounds. No palpable masses.  EXTREMITIES: No clubbing, cyanosis, or trace edema NEURO:  Alert & oriented x3.  CN II-XII grossly intact. 5/5 motor strength upper and lower extremities, +2 DTR, intact sensation to light tough LYMPH: No lymphadenopathy. SKIN: No rash or lesions.   Lab results:  WBC                                      12.1       h  4.0-10.5         K/uL  RBC                                      4.66              4.22-5.81        MIL/uL  Hemoglobin (HGB)                         13.9              13.0-17.0        g/dL  Hematocrit (HCT)                         41.5              39.0-52.0        %  MCV                                      89.1              78.0-100.0       fL  MCH -                                    29.8              26.0-34.0        pg  MCHC                                     33.5              30.0-36.0        g/dL  RDW                                      14.0              11.5-15.5        %  Platelet Count (PLT)                     298               150-400           K/uL  Neutrophils, %                           72                43-77            %  Lymphocytes, %                           16                12-46            %  Monocytes, %  12                3-12             %  Eosinophils, %                           0                 0-5              %  Basophils, %                             0                 0-1              %  Neutrophils, Absolute                    8.7        h      1.7-7.7          K/uL  Lymphocytes, Absolute                    2.0               0.7-4.0          K/uL  Monocytes, Absolute                      1.5        h      0.1-1.0          K/uL  Eosinophils, Absolute                    0.0               0.0-0.7          K/uL  Basophils, Absolute                      0.0               0.0-0.1          K/uL   Sodium (NA)                              141               135-145          mEq/L  Potassium (K)                            4.0               3.5-5.1          mEq/L  Chloride                                 111               96-112           mEq/L  CO2  24                19-32            mEq/L  Glucose                                  86                70-99            mg/dL  BUN                                      9                 6-23             mg/dL  Creatinine                               1.18              0.4-1.5          mg/dL  GFR, Est Non African American            >60               >60              mL/min  GFR, Est African American                >60               >60              mL/min    Oversized comment, see footnote  1  Calcium                                  8.8               8.4-10.5         mg/dL  Imaging results:  CXR: Likely mild left lower lobe pneumonia.   Assessment & Plan by Problem: (1) COPD exacerbation: bacterial vs. Viral vs. Medication non-compliance.  Unlikely PE because patient is not tachycardic or hypoxic,  Wells/Geneva score is 0 with a low probability of having a PE.  Chest Xray appears unchanged from 2/25, the LLL is likely represents chronic scarring as reported on previous CXR.  His social/financial problem is preventing him from getting his maintenance COPD medications; therefore, it is essential to get social worker involved so that he gets his orange card.   -Will admit to regular ed for in-patient -Start Duonebs q4hr & solumedrol 125mg  IV q8hr -Repeat 2view CXR in AM -Get 12 lead EKG -Send for Urine legionella, strep Ag, sputum gram stain and culture -Start Avelox 400mg  IV qd -Mucinex  (2) Polysubstance abuse: hx of positive cocaine in 09/2010.   -Get UDS -Get substance abuse counseling  R1, Pakou Rainbow Ho_______________________[phone]  Kaylyn Layer, Mona Sidhu______________________[phone]

## 2010-11-23 ENCOUNTER — Inpatient Hospital Stay (HOSPITAL_COMMUNITY): Payer: Self-pay

## 2010-11-23 LAB — BASIC METABOLIC PANEL
BUN: 12 mg/dL (ref 6–23)
Calcium: 9 mg/dL (ref 8.4–10.5)
GFR calc non Af Amer: >60 mL/min (ref >60)
Glucose, Bld: 164 mg/dL — ABNORMAL HIGH (ref 70–99)

## 2010-11-23 LAB — CBC
HCT: 43 % (ref 39.0–52.0)
MCHC: 32.6 g/dL (ref 30.0–36.0)
MCV: 90.3 fL (ref 78.0–100.0)
RDW: 14.2 % (ref 11.5–15.5)

## 2010-11-23 LAB — LEGIONELLA ANTIGEN, URINE

## 2010-11-24 DIAGNOSIS — J441 Chronic obstructive pulmonary disease with (acute) exacerbation: Secondary | ICD-10-CM

## 2010-11-24 LAB — CBC
Hemoglobin: 13.6 g/dL (ref 13.0–17.0)
MCH: 29.8 pg (ref 26.0–34.0)
MCV: 90.1 fL (ref 78.0–100.0)
RBC: 4.56 MIL/uL (ref 4.22–5.81)

## 2010-11-24 LAB — BASIC METABOLIC PANEL
CO2: 26 mEq/L (ref 19–32)
Chloride: 108 mEq/L (ref 96–112)
Creatinine, Ser: 1.14 mg/dL (ref 0.4–1.5)
GFR calc Af Amer: >60 mL/min (ref >60)

## 2010-11-24 LAB — T4, FREE: Free T4: 0.91 ng/dL (ref 0.80–1.80)

## 2010-11-29 ENCOUNTER — Encounter: Payer: Self-pay | Admitting: Internal Medicine

## 2010-11-29 ENCOUNTER — Ambulatory Visit (INDEPENDENT_AMBULATORY_CARE_PROVIDER_SITE_OTHER): Payer: Self-pay | Admitting: Internal Medicine

## 2010-11-29 ENCOUNTER — Telehealth: Payer: Self-pay | Admitting: *Deleted

## 2010-11-29 VITALS — BP 134/76 | HR 60 | Temp 97.8°F | Ht 72.0 in | Wt 229.9 lb

## 2010-11-29 DIAGNOSIS — F1910 Other psychoactive substance abuse, uncomplicated: Secondary | ICD-10-CM

## 2010-11-29 DIAGNOSIS — J449 Chronic obstructive pulmonary disease, unspecified: Secondary | ICD-10-CM

## 2010-11-29 DIAGNOSIS — R946 Abnormal results of thyroid function studies: Secondary | ICD-10-CM | POA: Insufficient documentation

## 2010-11-29 DIAGNOSIS — Z7189 Other specified counseling: Secondary | ICD-10-CM

## 2010-11-29 DIAGNOSIS — Z0000 Encounter for general adult medical examination without abnormal findings: Secondary | ICD-10-CM | POA: Insufficient documentation

## 2010-11-29 NOTE — Progress Notes (Signed)
  Subjective:    Patient ID: [name], male    DOB: [birth year], 50 y.o.   MRN: [id number]  HPI 50 year old male with past medical history of COPD, cocaine abuse, and obesity he is here for hospital followup.he was recently admitted with COPD exacerbation and discharged on 26th April. This was his second admission in last 2 months with COPD exacerbation.this is primarily because he cannot afford controller inhalers. He now has orange cardand access to [facility].  He is on prednisone taper and doing well. He has no shortness of breath during the daytime. He has no other complaints. He was prescribed Spiriva that he used for 5 days and is now out of it. He will get refill medications on Wednesday 2 days from now. Review of Systems  Constitutional: Negative for fever, chills, activity change and appetite change.  HENT: Negative for nosebleeds, facial swelling, neck pain and tinnitus.   Eyes: Negative for pain, discharge and visual disturbance.  Respiratory: Negative for cough, chest tightness and shortness of breath.   Cardiovascular: Negative for chest pain and palpitations.  Gastrointestinal: Negative for nausea, vomiting, abdominal pain, blood in stool and abdominal distention.  Skin: Negative for rash.  Neurological: Negative for dizziness, seizures, weakness and headaches.  Psychiatric/Behavioral: Negative for suicidal ideas, confusion and agitation.       Objective:   Physical Exam  Constitutional: He is oriented to person, place, and time. He appears well-developed and well-nourished.  HENT:  Head: Normocephalic and atraumatic.  Right Ear: External ear normal.  Left Ear: External ear normal.  Eyes: Conjunctivae and EOM are normal. Pupils are equal, round, and reactive to light. Right eye exhibits no discharge. Left eye exhibits no discharge.  Neck: Normal range of motion. Neck supple. No thyromegaly present.  Cardiovascular: Normal rate and regular rhythm.   No  murmur heard. Pulmonary/Chest: Effort normal and breath sounds normal. No respiratory distress. He has no wheezes. He has no rales.  Abdominal: Soft. Bowel sounds are normal. He exhibits no distension and no mass. There is no tenderness. There is no rebound and no guarding.  Musculoskeletal: Normal range of motion.  Neurological: He is alert and oriented to person, place, and time. He has normal reflexes. No cranial nerve deficit. Coordination normal.  Skin: No rash noted. He is not diaphoretic. No erythema.  Psychiatric: He has a normal mood and affect. His behavior is normal. Judgment and thought content normal.          Assessment & Plan:

## 2010-11-29 NOTE — Assessment & Plan Note (Signed)
Need PFTs. Now has orange card, I will refer him to the pulmonologist. He will continue spiriva and call us if he has trouble getting it. He is finishing his prednisone taper and doing well symptomatically and on auscultation.

## 2010-11-29 NOTE — Telephone Encounter (Signed)
CALLED AND SPOKE WITH MR Larance Netz, GAVE HIM BOTH OF HIS APPTS: MAY 2,012 @ 10:00AM WITH [facility] /  DR Sherene Sires , AND SECOND APPT WITH [facility] June 1, 012 @ 1:00PM PRE OP VISIT WITH THE NURSE / PROCEDURE WIT DR PERR ON June 12,012 @ 9:30AM WITH ARRIVAL AT 8:30 AM.   LELA STURDIVANT NTII

## 2010-11-29 NOTE — Patient Instructions (Signed)
Return in one month.  

## 2010-11-29 NOTE — Assessment & Plan Note (Signed)
Future labs ordered for possible sick thyroid detected on hospitalisation. Test to be done in 4 to 6 weeks.

## 2010-11-29 NOTE — Assessment & Plan Note (Signed)
Reports not taking cocaine or other drugs. He is occasional drinker

## 2010-11-29 NOTE — Assessment & Plan Note (Signed)
Discussed colonoscopy and patient would like to get it. His brother died from sarcoid lung and mother from pancreatic cancer so he is keen on testing.

## 2010-12-01 ENCOUNTER — Institutional Professional Consult (permissible substitution): Payer: Self-pay | Admitting: Internal Medicine

## 2010-12-20 ENCOUNTER — Ambulatory Visit (INDEPENDENT_AMBULATORY_CARE_PROVIDER_SITE_OTHER): Payer: Self-pay | Admitting: Internal Medicine

## 2010-12-20 ENCOUNTER — Encounter: Payer: Self-pay | Admitting: Internal Medicine

## 2010-12-20 VITALS — BP 122/82 | HR 63 | Temp 98.5°F | Ht 72.0 in | Wt 222.0 lb

## 2010-12-20 DIAGNOSIS — J449 Chronic obstructive pulmonary disease, unspecified: Secondary | ICD-10-CM

## 2010-12-20 MED ORDER — ALBUTEROL SULFATE HFA 108 (90 BASE) MCG/ACT IN AERS: 2.0000 | INHALATION_SPRAY | Freq: Four times a day (QID) | RESPIRATORY_TRACT | Status: DC | PRN

## 2010-12-20 MED ORDER — BUDESONIDE-FORMOTEROL FUMARATE 160-4.5 MCG/ACT IN AERO: INHALATION_SPRAY | RESPIRATORY_TRACT | Status: DC

## 2010-12-20 NOTE — Progress Notes (Signed)
Subjective:     Patient ID: [name], male   DOB: 1960-09-20, 50 y.o.   MRN: [id number]  HPI  50 yobm quit smoking 2010 with no respiratory problems on no resp meds until late fall 2011  12/20/2010 Initial pulmonary office eval  Cc cough / sob since Dec 2011 admitted x 2 as recent as 10/2010 at [facility] transiently quite a bit better but not sustaining improvents on spiriva and albuteraol  main issue is the cough esp p lie down at night mostly dry.  Pt denies any significant sore throat, dysphagia, itching, sneezing,  nasal congestion or excess/ purulent secretions,  fever, chills, sweats, unintended wt loss, pleuritic or exertional cp, hempoptysis, orthopnea pnd or leg swelling.    Also denies any obvious fluctuation of symptoms with weather or environmental changes or other aggravating or alleviating factors.    Review of Systems  Constitutional: Positive for unexpected weight change. Negative for fever, chills, activity change and appetite change.  HENT: Negative for congestion, sore throat, rhinorrhea, sneezing, trouble swallowing, dental problem, voice change and postnasal drip.   Eyes: Negative for visual disturbance.  Respiratory: Positive for cough and shortness of breath. Negative for choking.   Cardiovascular: Positive for chest pain. Negative for leg swelling.  Gastrointestinal: Negative for nausea, vomiting and abdominal pain.  Genitourinary: Negative for difficulty urinating.  Musculoskeletal: Positive for arthralgias.  Skin: Negative for rash.  Neurological: Positive for headaches.  Psychiatric/Behavioral: Negative for behavioral problems and confusion.       Objective:   Physical Exam amb bm nad Wt 222 12/20/2010  HEENT mild turbinate edema.  Oropharynx no thrush or excess pnd or cobblestoning.  No JVD or cervical adenopathy. Mild accessory muscle hypertrophy. Trachea midline, nl thryroid. Chest was hyperinflated by percussion with diminished breath sounds and moderate  increased exp time without wheeze. Hoover sign positive at mid inspiration. Regular rate and rhythm without murmur gallop or rub or increase P2 or edema.  Abd: no hsm, nl excursion. Ext warm without cyanosis or clubbing.      Assessment:         Plan:

## 2010-12-20 NOTE — Patient Instructions (Addendum)
Stop spiriva  Start symbicort Take 2 puffs first thing in am and then another 2 puffs about 12 hours later.    Work on inhaler technique:  relax and gently blow all the way out then take a nice smooth deep breath back in, triggering the inhaler at same time you start breathing in.  Hold for up to 5 seconds if you can.  Rinse and gargle with water when done   If your mouth or throat starts to bother you,   I suggest you time the inhaler to your dental care and after using the inhaler(s) brush teeth and tongue with a baking soda containing toothpaste and when you rinse this out, gargle with it first to see if this helps your mouth and throat.     Only use proventil hfa if needed for breathing or coughing and back it up with the nebulizer if absolutely needed  Please schedule a follow up office visit in 2 weeks, sooner if needed   ADD NEEDS F/U cxr next ov

## 2010-12-20 NOTE — Assessment & Plan Note (Signed)
  When respiratory symptoms begin well after a patient reports complete smoking cessation,  it is very hard to "blame" COPD specifically or airways disorders in general  ie it doesn't make any more sense than hearing a  [employer] driver wrecked his car while driving his kids to school or a surgeon sliced his hand off carving roast beef (it must be rare indeed!)     That is to say, once the high risk activity stops,  the symptoms should not suddenly erupt or markedly worsen.  If so, the differential diagnosis should include  obesity/deconditioning,  LPR/Reflux/Aspiration syndromes,  occult CHF, or  especially side effect of medications commonly used in this population.    ? How much is asthma > try symbicort 160 Take 2 puffs first thing in am and then another 2 puffs about 12 hours later.    The proper method of use, as well as anticipated side effects, of this metered-dose inhaler are discussed and demonstrated to the patient. Improved to 75% with coaching

## 2010-12-24 ENCOUNTER — Telehealth: Payer: Self-pay | Admitting: *Deleted

## 2010-12-29 ENCOUNTER — Encounter: Payer: Self-pay | Admitting: Internal Medicine

## 2010-12-29 ENCOUNTER — Ambulatory Visit (INDEPENDENT_AMBULATORY_CARE_PROVIDER_SITE_OTHER): Payer: Self-pay | Admitting: Internal Medicine

## 2010-12-29 DIAGNOSIS — R7309 Other abnormal glucose: Secondary | ICD-10-CM

## 2010-12-29 DIAGNOSIS — J4489 Other specified chronic obstructive pulmonary disease: Secondary | ICD-10-CM

## 2010-12-29 DIAGNOSIS — J449 Chronic obstructive pulmonary disease, unspecified: Secondary | ICD-10-CM

## 2010-12-29 DIAGNOSIS — Z7189 Other specified counseling: Secondary | ICD-10-CM

## 2010-12-29 DIAGNOSIS — R739 Hyperglycemia, unspecified: Secondary | ICD-10-CM | POA: Insufficient documentation

## 2010-12-29 DIAGNOSIS — R946 Abnormal results of thyroid function studies: Secondary | ICD-10-CM

## 2010-12-29 LAB — BASIC METABOLIC PANEL
CO2: 30 mEq/L (ref 19–32)
Calcium: 8.9 mg/dL (ref 8.4–10.5)
Sodium: 142 mEq/L (ref 135–145)

## 2010-12-29 LAB — T4, FREE: Free T4: 0.99 ng/dL (ref 0.80–1.80)

## 2010-12-29 LAB — TSH: TSH: 0.944 u[IU]/mL (ref 0.350–4.500)

## 2010-12-29 NOTE — Assessment & Plan Note (Addendum)
Managed by Dr. Sandrea Hughs at [facility] pulmonary. Patient's breathing is at baseline, no changes made to his regiment.

## 2010-12-29 NOTE — Progress Notes (Signed)
  Subjective:    Patient ID: [name], male    DOB: 04-27-1961, 50 y.o.   MRN: [id number]  HPI 50 year old male with past medical history of COPD, cocaine abuse, and obesity he is here for a routine followup and for repeat labs, patient was noted to have a low TSH with a normal free T4 this may have been in the setting of acute COPD exacerbation, patient is asymptomatic. Also on review his chart it was found that he has been hyperglycemic and his basic metabolic panels without him being worked up for diabetes. Patient denies smoking, denies drug abuse, denies wheezing or worsening of his COPD or breathing function, states that he has been taking all his medications as prescribed. Denies any chest pain shortness of breath nausea or vomiting.   Review of Systems  All other systems reviewed and are negative.       Objective:   Physical Exam  Nursing note and vitals reviewed. Constitutional: He is oriented to person, place, and time. He appears well-developed and well-nourished.  HENT:  Head: Normocephalic and atraumatic.  Eyes: Pupils are equal, round, and reactive to light.  Neck: Normal range of motion. No JVD present. No thyromegaly present.  Cardiovascular: Normal rate, regular rhythm and normal heart sounds.   Pulmonary/Chest: Effort normal and breath sounds normal. He has no wheezes. He has no rales.  Abdominal: Soft. Bowel sounds are normal. There is no tenderness. There is no rebound.  Musculoskeletal: Normal range of motion. He exhibits no edema.  Neurological: He is alert and oriented to person, place, and time.  Skin: Skin is warm and dry.          Assessment & Plan:

## 2010-12-29 NOTE — Assessment & Plan Note (Signed)
>>  ASSESSMENT AND PLAN FOR HYPERGLYCEMIA WRITTEN ON 12/29/2010  2:06 PM BY KITTIE DOVER, MD  Review of patient's chart it was found to be hyperglycemic, with cbg's in the range of 170-210, patient may have mild diabetes we'll check hemoglobin A1c, if it is above 6.5 his diagnostic for diabetes mellitus, and will commence treatment if his A1c is more than 7.

## 2010-12-29 NOTE — Assessment & Plan Note (Signed)
Patient scheduled to undergo coloscopy on 1st June 2012. Otherwise up-to-date on all of his health maintenance

## 2010-12-29 NOTE — Assessment & Plan Note (Signed)
patient is asymptomatic, we will repeat this today if patient's TSH is low with a normal T4 patient will then bear the diagnosis of subclinical hypothyroidism

## 2010-12-29 NOTE — Assessment & Plan Note (Signed)
Review of patient's chart it was found to be hyperglycemic, with cbg's in the range of 170-210, patient may have mild diabetes we'll check hemoglobin A1c, if it is above 6.5 his diagnostic for diabetes mellitus, and will commence treatment if his A1c is more than 7.

## 2011-01-03 ENCOUNTER — Ambulatory Visit (AMBULATORY_SURGERY_CENTER): Payer: Self-pay | Admitting: *Deleted

## 2011-01-03 VITALS — Ht 72.0 in | Wt 220.5 lb

## 2011-01-03 DIAGNOSIS — Z1211 Encounter for screening for malignant neoplasm of colon: Secondary | ICD-10-CM

## 2011-01-03 NOTE — Progress Notes (Signed)
Pt uses [facility] Department for prescriptions.  MoviPrep is unavailable at pharmacy.  Rx for Colytly Split does Prep called in to [facility] Department.  Ezra Sites

## 2011-01-04 ENCOUNTER — Encounter: Payer: Self-pay | Admitting: Internal Medicine

## 2011-01-04 ENCOUNTER — Ambulatory Visit (INDEPENDENT_AMBULATORY_CARE_PROVIDER_SITE_OTHER)
Admission: RE | Admit: 2011-01-04 | Discharge: 2011-01-04 | Disposition: A | Discharge status: Home / Self Care | Payer: Self-pay | Source: Ambulatory Visit | Attending: Internal Medicine | Admitting: Internal Medicine

## 2011-01-04 ENCOUNTER — Ambulatory Visit (INDEPENDENT_AMBULATORY_CARE_PROVIDER_SITE_OTHER): Payer: Self-pay | Admitting: Internal Medicine

## 2011-01-04 VITALS — BP 130/82 | HR 81 | Temp 97.9°F | Ht 72.0 in | Wt 222.2 lb

## 2011-01-04 DIAGNOSIS — J449 Chronic obstructive pulmonary disease, unspecified: Secondary | ICD-10-CM

## 2011-01-04 DIAGNOSIS — J189 Pneumonia, unspecified organism: Secondary | ICD-10-CM

## 2011-01-04 MED ORDER — PREDNISONE (PAK) 10 MG PO TABS: ORAL_TABLET | ORAL | Status: DC

## 2011-01-04 NOTE — Progress Notes (Signed)
Subjective:     Patient ID: [name], male   DOB: April 09, 1961, 50 y.o.   MRN: [id number]  HPI  50 yobm quit smoking 2010 with no respiratory problems on no resp meds until late fall 2011  12/20/2010 Initial pulmonary office eval  Cc cough / sob since Dec 2011 admitted x 2 as recent as 10/2010 at [facility] transiently quite a bit better but not sustaining improvents on spiriva and albuteraol  main issue is the cough esp p lie down at night mostly   rec Stop spiriva  Start symbicort Take 2 puffs first thing in am and then another 2 puffs about 12 hours later.  Work on inhaler technique  Only use proventil hfa if needed for breathing or coughing and back it up with the nebulizer if absolutely needed  ADD NEEDS F/U cxr next ov  01/04/2011 ov/Tashonda Pinkus  Cc sob better, sob coming up the hill.  Walks about half mile s stopping/ still not inhaling hfa optimally. No purulent sputum.  Pt denies any significant sore throat, dysphagia, itching, sneezing,  nasal congestion or excess/ purulent secretions,  fever, chills, sweats, unintended wt loss, pleuritic or exertional cp, hempoptysis, orthopnea pnd or leg swelling.    Also denies any obvious fluctuation of symptoms with weather or environmental changes or other aggravating or alleviating factors.          Objective:   Physical Exam amb bm nad Wt 222 12/20/2010 >  222 01/04/2011   HEENT mild turbinate edema.  Oropharynx no thrush or excess pnd or cobblestoning.  No JVD or cervical adenopathy. Mild accessory muscle hypertrophy. Trachea midline, nl thryroid. Chest was hyperinflated by percussion with diminished breath sounds and moderate increased exp time without wheeze. Hoover sign positive at mid inspiration. Regular rate and rhythm without murmur gallop or rub or increase P2 or edema.  Abd: no hsm, nl excursion. Ext warm without cyanosis or clubbing.       cxr  01/04/2011 Interval clearing of left lower lobe infiltrative opacities. No new or active process is  identified. Stable chronic findings are detailed above  Assessment:         Plan:

## 2011-01-04 NOTE — Patient Instructions (Signed)
We will call you with your cxr report  Prednisone 10 mg take  4 each am x 2 days,   2 each am x 2 days,  1 each am x2days and stop   Work on inhaler technique:  relax and gently blow all the way out then take a nice smooth deep breath back in, triggering the inhaler at same time you start breathing in.  Hold for up to 5 seconds if you can.  Rinse and gargle with water when done   If your mouth or throat starts to bother you,   I suggest you time the inhaler to your dental care and after using the inhaler(s) brush teeth and tongue with a baking soda containing toothpaste and when you rinse this out, gargle with it first to see if this helps your mouth and throat.     Please schedule a follow up office visit in 4 weeks, sooner if needed with pfts

## 2011-01-04 NOTE — Assessment & Plan Note (Signed)
DDX of  difficult airways managment all start with A and  include Adherence, Ace Inhibitors, Acid Reflux, Active Sinus Disease, Alpha 1 Antitripsin deficiency, Anxiety masquerading as Airways dz,  ABPA,  allergy(esp in young), Aspiration (esp in elderly), Adverse effects of DPI,  Active smokers, plus two Bs  = Bronchiectasis and Beta blocker use..and one C= CHF  Adherence is always the initial "prime suspect" and is a multilayered concern that requires a "trust but verify" approach in every patient - starting with knowing how to use medications, especially inhalers, correctly, keeping up with refills and understanding the fundamental difference between maintenance and prns vs those medications only taken for a very short course and then stopped and not refilled. The proper method of use, as well as anticipated side effects, of this metered-dose inhaler are discussed and demonstrated to the patient. This improve to 50% with coaching, needs work

## 2011-01-05 NOTE — Telephone Encounter (Signed)
Per leslie's in basket, she had called the patient re cxr results.  Called spoke with patient, advised of 6.5.12 cxr results as stated by MW.  Pt verbalized his understanding.

## 2011-01-05 NOTE — Telephone Encounter (Signed)
Patient phoned stated he was returning a call to Verlon Au he can be reached at [phone].[place]el

## 2011-01-11 ENCOUNTER — Emergency Department (HOSPITAL_COMMUNITY): Payer: Self-pay

## 2011-01-11 ENCOUNTER — Inpatient Hospital Stay (HOSPITAL_COMMUNITY)
Admission: EM | Admit: 2011-01-11 | Discharge: 2011-01-13 | DRG: 192 | Disposition: A | Discharge status: Home / Self Care | Payer: Self-pay | Attending: Internal Medicine | Admitting: Internal Medicine

## 2011-01-11 ENCOUNTER — Encounter: Payer: Self-pay | Admitting: Internal Medicine

## 2011-01-11 ENCOUNTER — Other Ambulatory Visit: Payer: Self-pay | Admitting: Internal Medicine

## 2011-01-11 DIAGNOSIS — Z9119 Patient's noncompliance with other medical treatment and regimen: Secondary | ICD-10-CM

## 2011-01-11 DIAGNOSIS — F1410 Cocaine abuse, uncomplicated: Secondary | ICD-10-CM | POA: Diagnosis present

## 2011-01-11 DIAGNOSIS — Z886 Allergy status to analgesic agent status: Secondary | ICD-10-CM

## 2011-01-11 DIAGNOSIS — R030 Elevated blood-pressure reading, without diagnosis of hypertension: Secondary | ICD-10-CM | POA: Diagnosis present

## 2011-01-11 DIAGNOSIS — J441 Chronic obstructive pulmonary disease with (acute) exacerbation: Principal | ICD-10-CM | POA: Diagnosis present

## 2011-01-11 DIAGNOSIS — E669 Obesity, unspecified: Secondary | ICD-10-CM | POA: Diagnosis present

## 2011-01-11 DIAGNOSIS — Z91199 Patient's noncompliance with other medical treatment and regimen due to unspecified reason: Secondary | ICD-10-CM

## 2011-01-11 LAB — RAPID URINE DRUG SCREEN, HOSP PERFORMED
Barbiturates: NOT DETECTED
Tetrahydrocannabinol: NOT DETECTED

## 2011-01-11 LAB — POCT I-STAT, CHEM 8
BUN: 12 mg/dL (ref 6–23)
Calcium, Ion: 1.15 mmol/L (ref 1.12–1.32)
Chloride: 108 mEq/L (ref 96–112)
Potassium: 3.7 mEq/L (ref 3.5–5.1)
Sodium: 145 mEq/L (ref 135–145)

## 2011-01-11 LAB — CK TOTAL AND CKMB (NOT AT ARMC)
CK, MB: 3 ng/mL (ref 0.3–4.0)
Relative Index: 0.8 (ref 0.0–2.5)
Total CK: 364 U/L — ABNORMAL HIGH (ref 7–232)

## 2011-01-11 LAB — POCT I-STAT 3, ART BLOOD GAS (G3+)
Acid-base deficit: 4 mmol/L — ABNORMAL HIGH (ref 0.0–2.0)
O2 Saturation: 97 %
pO2, Arterial: 97 mmHg (ref 80.0–100.0)

## 2011-01-11 NOTE — Progress Notes (Signed)
Hospital Admission Note Date: 01/11/2011  Patient name: [name] Medical record number: [id number] Date of birth: 01/30/1961 Age: 50 y.o. Gender: male PCP: Jackson Latino, MD  Medical Service:  [facility]  Attending physician:  Dr. Margarito Liner   Pager: Resident (R2/R3): Dr. Scot Dock    Pager: [phone] Resident (R1): Dr. Cathey Endow    Pager: [phone]  Chief Complaint: sob  History of Present Illness: Patient is a 50 y/o man with h/o COPD (with recurrent admissions 2/2 medication noncompliance), cocaine abuse and obesity presenting with sob after taking BC powder. Patient states he was having back pain earlier today and so took some BC powder, subsequent to which he developed increased work of breathing, non productive coughing, diaphoresis, and palpitations. EMS was called 2/2 severe sob.   Of note, the patient states that he has experienced similar symptoms after taking ASA. He was started on baby ASA in April and developed several episodes of sob and diaphoresis. Patient states he was admitted for sob 2/2 ASA use about 2 months ago but this is not consistent with chart review. However, ASA is listed as his allergy. Today, when he took the [facility] powder, he was unaware that ASA was one of the ingredients. Patient denied any cocaine use prior to the event. He also states he had been in his usual state of health without any recent fever, chills, chest pain, abd pain, n/v, changes in bowel habits, headaches, lightheadedness, weakness or any other systemic symptoms.  ED course is notable for respiratory distress and tachycardia. He was placed on BIPAP and was receiving neb treatment on evaluation.  Current Outpatient Prescriptions  Medication Sig Dispense Refill  . albuterol (PROVENTIL HFA) 108 (90 BASE) MCG/ACT inhaler Inhale 2 puffs into the lungs every 6 (six) hours as needed for wheezing.      Marland Kitchen albuterol (PROVENTIL) (2.5 MG/3ML) 0.083% nebulizer solution Take 3 mLs (2.5 mg total) by nebulization every 4  (four) hours as needed for wheezing.  25 vial  2  . budesonide-formoterol (SYMBICORT) 160-4.5 MCG/ACT inhaler Take 2 puffs first thing in am and then another 2 puffs about 12 hours later.     1 Inhaler  12  . predniSONE (DELTASONE) 10 MG tablet pack Prednisone 10 mg take  4 each am x 2 days,   2 each am x 2 days,  1 each am x2days and stop  14 tablet  0    Allergies: Aspirin  Past Medical History  Diagnosis Date  . COPD (chronic obstructive pulmonary disease)   . Obesity (BMI 30-39.9)   . Substance abuse     cocaine 35 years ago    Past Surgical History  Procedure Date  . Cholecystectomy 07/30/89  . Pelvic fracture surgery     Family History  Problem Relation Age of Onset  . Pancreatic cancer Mother   . Alcohol abuse Father   . Sarcoidosis Brother     History   Social History  . Marital Status: Single    Spouse Name: N/A    Number of Children: N/A  . Years of Education: N/A   Occupational History  . Unemployed     worked as a Psychologist, counselling History Main Topics  . Smoking status: Former Smoker -- 1.0 packs/day for 15 years    Types: Cigarettes    Quit date: 10/30/2009  . Smokeless tobacco: Never Used  . Alcohol Use: 1.2 oz/week    2 Cans of beer per week     Beer on  wkends  . Drug Use: No     former cocaine use 35 years ago  . Sexually Active: Not on file     unemployed, was a Investment banker, operational   Other Topics Concern  . Not on file   Social History Narrative  . No narrative on file    Review of Systems: Constitutional: positive for fatigue and sweats Respiratory: positive for wheezing and dyspnea, negative for cough, hemoptysis, pleurisy/chest pain and sputum Cardiovascular: positive for dyspnea and palpitations, negative for chest pain, chest pressure/discomfort and syncope Gastrointestinal: negative for abdominal pain, change in bowel habits, constipation, diarrhea, nausea and vomiting Genitourinary:negative for dysuria, frequency and  hematuria Hematologic/lymphatic: negative for bleeding and lymphadenopathy Allergic/Immunologic: positive for sob  Physical Exam: Vitals:  t  98.3, bp 146/76 (226/102), p 117-123, rr 28-32, 97% on 3L  General appearance: alert and moderate distress Head: Normocephalic, without obvious abnormality, atraumatic Throat: unable to assess due to BIPAP  Neck: no carotid bruit, no JVD and supple, symmetrical, trachea midline Lungs: diffuse expiratory wheezes. No crackles or rhales Heart: tachycardic. S1 and S2 normal. No MRG Abdomen: soft, non-tender; bowel sounds normal; no masses,  no organomegaly Extremities: extremities normal, atraumatic, no cyanosis or edema Pulses: 2+ and symmetric Neurologic: Grossly normal   Lab results:  TCO2                                     29                0-100            mmol/L  Ionized Calcium                          1.15              1.12-1.32        mmol/L  Hemoglobin (HGB)                         17.0              13.0-17.0        g/dL  Hematocrit (HCT)                         50.0              39.0-52.0        %  Sodium (NA)                              145               135-145          mEq/L  Potassium (K)                            3.7               3.5-5.1          mEq/L  Chloride                                 108  96-112           mEq/L  Glucose                                  136        h      70-99            mg/dL  BUN                                      12                6-23             mg/dL  Creatinine                               1.50              0.4-1.5          mg/dL  Amphetamins                              SEE NOTE.         NDT    NONE DETECTED  Barbiturates                             SEE NOTE.         NDT    Oversized comment, see footnote  1  Benzodiazepines                          SEE NOTE.         NDT    NONE DETECTED  Cocaine                                  POSITIVE   a      NDT  Opiates                                   SEE NOTE.         NDT    NONE DETECTED  Tetrahydrocannabinol                     SEE NOTE.         NDT    NONE DETECTED  Troponin I                               <0.30             <0.30            Ng/mL  Creatine Kinase, Total                   364        h      7-232            U/L  CK, MB  3.0               0.3-4.0          ng/mL  Relative Index                           0.8               0.0-2.5  Imaging results:   PORTABLE CHEST - 1 VIEW    Comparison: Chest radiograph 01/04/2011    Findings: Normal mediastinum and cardiac silhouette.   No evidence   of effusion, infiltrate, or pneumothorax.  No bony abnormality.    IMPRESSION:   No acute cardiopulmonary process.  Assessment & Plan by Problem:  1. SOB and tachycardia - likely COPD exacerbation (given diffuse wheezing, coughing and increased work of breathing from baseline) 2/2 cocaine use and possible ASA allergy causing bronchospasm given the patient's history vs questionable viral URI. Other diagnostic considerations include PE, however unlikely given the more likely alternative diagnosis, ACS, again unlikely in the setting of no new EKG changes and initial CEs have being negative, unlikely 2/2 infection given he's afebrile, and there's no clinical or radiographic evidence for an infection. Aortic dissection is another diagnostic consideration in the setting of cocaine use, but the patient's history is not consistent with this, i.e., in the absence of chest pain, hemodynamic instability or widening of the mediastinum on CXR.  - admit to SDU for close monitoring - continue BiPAP for now - continue IV Solumedrol, nebs and Avelox empirically - cycle CEs to r/o ACS in the setting of cocaine use  2. Elevated blood pressure - likely 2/2 cocaine abuse - Hydralazine 10mg  IV prn for systolic >180  3. PSA - SW consult for counseling  4. Mildly elevated Cr - likely pre-renal - IVF- NS  @ 125cc/hr (already received 2L in the ED) - f/u am BMET  5. DVT ppx - Heparin

## 2011-01-12 LAB — COMPREHENSIVE METABOLIC PANEL
ALT: 15 U/L (ref 0–53)
Alkaline Phosphatase: 46 U/L (ref 39–117)
CO2: 22 mEq/L (ref 19–32)
Calcium: 8.2 mg/dL — ABNORMAL LOW (ref 8.4–10.5)
GFR calc Af Amer: >60 mL/min (ref >60)
GFR calc non Af Amer: >60 mL/min (ref >60)
Glucose, Bld: 199 mg/dL — ABNORMAL HIGH (ref 70–99)
Sodium: 140 mEq/L (ref 135–145)
Total Bilirubin: 0.3 mg/dL (ref 0.3–1.2)

## 2011-01-12 LAB — CBC
Hemoglobin: 13.2 g/dL (ref 13.0–17.0)
MCH: 29.3 pg (ref 26.0–34.0)
MCHC: 32.8 g/dL (ref 30.0–36.0)

## 2011-01-12 LAB — CARDIAC PANEL(CRET KIN+CKTOT+MB+TROPI): Total CK: 256 U/L — ABNORMAL HIGH (ref 7–232)

## 2011-01-13 ENCOUNTER — Encounter: Payer: Self-pay | Admitting: Internal Medicine

## 2011-01-13 LAB — HEMOGLOBIN A1C
Hgb A1c MFr Bld: 6.1 % — ABNORMAL HIGH (ref <5.7)
Mean Plasma Glucose: 128 mg/dL — ABNORMAL HIGH (ref <117)

## 2011-01-13 LAB — GLUCOSE, CAPILLARY: Glucose-Capillary: 171 mg/dL — ABNORMAL HIGH (ref 70–99)

## 2011-01-13 NOTE — Progress Notes (Signed)
Addended by: Sinda Du on: 01/13/2011 12:12 PM   Modules accepted: Orders

## 2011-01-13 NOTE — Patient Instructions (Signed)
Pt presented with SOB in the setting of goodies poweder use (which contains ASA).  Pt has known allergy which causes SOB.  Pt was treated for COPD exas and improved by hospital day one. Pt was discharged with steroid taper and 5 day course of ABX.  Meds and problems updated.

## 2011-01-19 ENCOUNTER — Other Ambulatory Visit: Payer: Self-pay | Admitting: Internal Medicine

## 2011-01-19 ENCOUNTER — Encounter: Payer: Self-pay | Admitting: Internal Medicine

## 2011-01-24 ENCOUNTER — Encounter: Payer: Self-pay | Admitting: Internal Medicine

## 2011-02-03 ENCOUNTER — Other Ambulatory Visit: Payer: Self-pay | Admitting: Internal Medicine

## 2011-02-03 ENCOUNTER — Encounter: Payer: Self-pay | Admitting: Internal Medicine

## 2011-02-03 ENCOUNTER — Ambulatory Visit (INDEPENDENT_AMBULATORY_CARE_PROVIDER_SITE_OTHER): Payer: Self-pay | Admitting: Internal Medicine

## 2011-02-03 ENCOUNTER — Telehealth: Payer: Self-pay | Admitting: Internal Medicine

## 2011-02-03 VITALS — BP 130/80 | HR 59 | Temp 97.5°F | Ht 72.0 in | Wt 224.3 lb

## 2011-02-03 DIAGNOSIS — G894 Chronic pain syndrome: Secondary | ICD-10-CM | POA: Insufficient documentation

## 2011-02-03 DIAGNOSIS — E669 Obesity, unspecified: Secondary | ICD-10-CM

## 2011-02-03 DIAGNOSIS — R7309 Other abnormal glucose: Secondary | ICD-10-CM

## 2011-02-03 DIAGNOSIS — M545 Low back pain, unspecified: Secondary | ICD-10-CM

## 2011-02-03 DIAGNOSIS — M549 Dorsalgia, unspecified: Secondary | ICD-10-CM

## 2011-02-03 DIAGNOSIS — Z7189 Other specified counseling: Secondary | ICD-10-CM

## 2011-02-03 DIAGNOSIS — R946 Abnormal results of thyroid function studies: Secondary | ICD-10-CM

## 2011-02-03 DIAGNOSIS — R739 Hyperglycemia, unspecified: Secondary | ICD-10-CM

## 2011-02-03 DIAGNOSIS — J4489 Other specified chronic obstructive pulmonary disease: Secondary | ICD-10-CM

## 2011-02-03 DIAGNOSIS — J449 Chronic obstructive pulmonary disease, unspecified: Secondary | ICD-10-CM

## 2011-02-03 DIAGNOSIS — Z1211 Encounter for screening for malignant neoplasm of colon: Secondary | ICD-10-CM

## 2011-02-03 HISTORY — DX: Chronic pain syndrome: G89.4

## 2011-02-03 MED ORDER — TRAMADOL HCL 50 MG PO TABS: 50.0000 mg | ORAL_TABLET | Freq: Three times a day (TID) | ORAL | Status: DC | PRN

## 2011-02-03 MED ORDER — BUDESONIDE-FORMOTEROL FUMARATE 160-4.5 MCG/ACT IN AERO: INHALATION_SPRAY | RESPIRATORY_TRACT | Status: DC
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
## 2011-02-03 NOTE — Assessment & Plan Note (Signed)
>>  ASSESSMENT AND PLAN FOR HYPERGLYCEMIA WRITTEN ON 02/03/2011  5:59 PM BY RAISCH, OZELL BROCKS, MD  [name] has had a number of high plasma glucose levels during his hospital admissions (up to the 200s) and had an A1C of 6.1 in June 2012.  We checked a BMP today to get a random glucose.  If the value is high, we will have him return for a fasting plasma glucose level in a couple of days. I discussed that exercise would help with both glucose control and weight loss as well has helping to relieve his back pain. Also referred him to the Extreme Makeover Diet course. - BMP - fasting glucose if BMP shows hyperglycemia - diet and exercise class

## 2011-02-03 NOTE — Assessment & Plan Note (Signed)
Will recheck TSH and free t4 at next visit and reevaluate then.

## 2011-02-03 NOTE — Assessment & Plan Note (Addendum)
I am happy to see that [name] is doing well after his hospitalization and that he is back to his baseline level of function. His medicine regimen is working well for him and will remain unchanged.  I counseled [name] that smoking, even second hand smoke will continue to negatively affect his COPD and should be avoided. I also counseled him that cocaine in all frorms should be avoided and that particulary crack will cause further lung damage. I also renewed his prescription for Symbicort per his request.

## 2011-02-03 NOTE — Assessment & Plan Note (Signed)
[name] has had a number of high plasma glucose levels during his hospital admissions (up to the 200s) and had an A1C of 6.1 in June 2012.  We checked a BMP today to get a random glucose.  If the value is high, we will have him return for a fasting plasma glucose level in a couple of days. I discussed that exercise would help with both glucose control and weight loss as well has helping to relieve his back pain. Also referred him to the Extreme Makeover Diet course. - BMP - fasting glucose if BMP shows hyperglycemia - diet and exercise class

## 2011-02-03 NOTE — Patient Instructions (Signed)
Please sign up for extreme makeover class  Take tramadol for back pain every 8 hours as needed  Follow up with Dr. Candy Sledge in 3-4 weeks  We will schedule you for a colonoscopy.

## 2011-02-03 NOTE — Assessment & Plan Note (Signed)
Advised against smoking, illicit drug use and advised a healthier diet. Also referred [name] for screening colonoscopy.

## 2011-02-03 NOTE — Progress Notes (Signed)
I saw patient and discussed his care with resident Dr. Candy Sledge.  I agree with the clinical findings and plans as outlined in his note.

## 2011-02-03 NOTE — Assessment & Plan Note (Addendum)
Mr. Rothert has not tried to lose weight, but does endorse a desire to.  I referred him to the [facility] Course for diet counseling.

## 2011-02-03 NOTE — Assessment & Plan Note (Addendum)
[name] back pain appears to be musculoskeletal in nature with very little suspicion for a malignant or infectious etiology.  However, I will order sed rate and CRP today to be sure.  There is no concerning signs of neurologic damage or nerve impingement.  We will obtain imaging records from his urgent care visit and review them at his follow up visit in 3 weeks.  Also advised that he could increase his tylenol to up to 3g total daily dose (9 pills of regular strength tylenol spread out over 24h).  After the acute phase of his pain is resolved we will refer him to PT to strengthen his back muscles.  I advised that in the long run exercise will likely be a very important step in trying to stay pain free.    - obtain records for back images - tramadol 50 mg Q8H PRN for pain (30 tabs) -tylenol up to 3g total daily dose but not more - f/u in 3-4 weeks - PT when acute pain resolved

## 2011-02-04 ENCOUNTER — Ambulatory Visit (INDEPENDENT_AMBULATORY_CARE_PROVIDER_SITE_OTHER): Payer: Self-pay | Admitting: Internal Medicine

## 2011-02-04 ENCOUNTER — Encounter: Payer: Self-pay | Admitting: Internal Medicine

## 2011-02-04 DIAGNOSIS — J45909 Unspecified asthma, uncomplicated: Secondary | ICD-10-CM

## 2011-02-04 LAB — C-REACTIVE PROTEIN: CRP: 0.3 mg/dL (ref <0.6)

## 2011-02-04 LAB — BASIC METABOLIC PANEL WITH GFR
GFR, Est African American: >60 mL/min (ref >60)
GFR, Est Non African American: >60 mL/min (ref >60)
Potassium: 4.2 mEq/L (ref 3.5–5.3)
Sodium: 141 mEq/L (ref 135–145)

## 2011-02-04 LAB — PULMONARY FUNCTION TEST

## 2011-02-04 NOTE — Assessment & Plan Note (Signed)
All goals of chronic asthma control met including optimal function and elimination of symptoms with minimal need for rescue therapy.  Contingencies discussed in full including contacting this office immediately if not controlling the symptoms using the rule of two's.     See instructions for specific recommendations which were reviewed directly with the patient who was given a copy with highlighter outlining the key components.  

## 2011-02-04 NOTE — Patient Instructions (Signed)
Continue Symbicort 160 1-2 every 12 hours and follow up with Dr Candy Sledge to consider lowering the strength of your medication  If your breathing worsens or you need to use your rescue inhaler(proventil "spare tire")  more than twice weekly or wake up more than twice a month with any respiratory symptoms or require more than two rescue inhalers per year, we need to see you right away because this means we're not controlling the underlying problem (inflammation) adequately.  Rescue inhalers do not control inflammation and overuse can lead to unnecessary and costly consequences.  They can make you feel better temporarily but eventually they will quit working effectively much as sleep aids lead to more insomnia if used regularly.    If you are satisfied with your treatment plan let your doctor know and he/she can either refill your medications or you can return here when your prescription runs out.     If in any way you are not 100% satisfied,  please tell us.  If 100% better, tell your friends!

## 2011-02-04 NOTE — Progress Notes (Signed)
Subjective:     Patient ID: [name], male   DOB: November 26, 1960, 50 y.o.   MRN: [id number]  HPI  50 yobm quit smoking 2010 with no respiratory problems on no resp meds until late fall 2011  12/20/2010 Initial pulmonary office eval  Cc cough / sob since Dec 2011 admitted x 2 as recent as 10/2010 at [facility] transiently quite a bit better but not sustaining improvents on spiriva and albuterol  main issue is the cough esp p lie down at night mostly   rec Stop spiriva  Start symbicort Take 2 puffs first thing in am and then another 2 puffs about 12 hours later.  Work on inhaler technique  Only use proventil hfa if needed for breathing or coughing and back it up with the nebulizer if absolutely needed  ADD NEEDS F/U cxr next ov  01/04/2011 ov/[name]  Cc sob better, sob coming up the hill.  Walks about half mile s stopping/ still not inhaling hfa optimally.  rec work on hfa, no change rx , cxr now nl  02/04/2011 ov/[name] cc no sob, no cough. Sleeping ok without nocturnal  or early am exac of resp c/o's or need for noct saba.   .          Objective:   Physical Exam amb bm nad Wt 222 12/20/2010 >  222 01/04/2011 > 223 02/04/2011   HEENT mild turbinate edema.  Oropharynx no thrush or excess pnd or cobblestoning.  No JVD or cervical adenopathy. Mild accessory muscle hypertrophy. Trachea midline, nl thryroid. Chest was hyperinflated by percussion with diminished breath sounds and moderate increased exp time without wheeze. Hoover sign positive at mid inspiration. Regular rate and rhythm without murmur gallop or rub or increase P2 or edema.  Abd: no hsm, nl excursion. Ext warm without cyanosis or clubbing.       cxr  01/04/2011 Interval clearing of left lower lobe infiltrative opacities. No new or active process is identified. Stable chronic findings are detailed above  Assessment:         Plan:

## 2011-02-04 NOTE — Progress Notes (Signed)
PFT done today. 

## 2011-02-08 NOTE — Discharge Summary (Signed)
[name], [name] NO.:  [id number]  MEDICAL RECORD NO.:  [id number]  LOCATION:  3715                         FACILITY:  [facility]  PHYSICIAN:  Ileana Roup, M.D.  DATE OF BIRTH:  [birth year]  DATE OF ADMISSION:  01/11/2011 DATE OF DISCHARGE:  01/13/2011                              DISCHARGE SUMMARY   ATTENDING AT DISCHARGE:  Ileana Roup, MD.  DISCHARGE DIAGNOSES: 1. Chronic obstructive pulmonary disease exacerbation. 2. Elevated blood pressure. 3. Substance abuse. 4. Mildly elevated creatinine.  DISCHARGE MEDICATIONS: 1. Tylenol 325 mg tabs, take 1-2 tablets by mouth every 6 hours as     needed for pain or fever. 2. Moxifloxacin 400 mg tabs, take 1 tablet by mouth daily for the next     4 days. 3. Prednisone 10 mg tabs, take 4 tablets by mouth daily x3 days, 3     tablets x3 days, 2 tablets times x3 days, 1 tablet x3 days, then     stop. 4. Albuterol 90 mcg inhaler, take 2 puffs inhaled every 4 hours as     needed for shortness of breath. 5. Albuterol nebulization, take 1 nebulization inhaled every 6 hours     as needed for shortness of breath. 6. Symbicort 160/4.5 mcg 2 puffs inhaled twice daily.  DISPOSITION AND FOLLOWUP:  The patient is scheduled to follow up in the Outpatient Clinic at [facility] with Dr. Arvilla Market on June 25 at 9:15, at that time, the patient's respiratory status should be reassessed to ensure that he has improved from a COPD exacerbation standpoint.  The patient should also continue to be counseled regarding abstinence from the use of illicit drugs.  CBC and BMET were within normal limits during his hospital stay, but hemoglobin A1c was pending at the time of his discharge and this will need to be followed up.  PROCEDURES PERFORMED:  One-view portable chest x-ray was performed on January 11, 2011, which demonstrated no acute cardiopulmonary process.  CONSULTATIONS:  None.  BRIEF ADMITTING H AND P:  This is a  50 year old male with a history of COPD with recurrent admissions secondary to medication noncompliance, cocaine abuse, and obesity, who presents with shortness of breath after taking BC powder.  The patient states that he was having back pain early in the day and took some BC power subsequently to which he developed increased work of breathing, nonproductive cough, diaphoresis, and palpitations.  EMS was called secondary to severe shortness of breath. Of note, the patient states these experienced similar symptoms in the past after taking aspirin.  He was started on baby aspirin in April and developed several episodes of shortness of breath and diaphoresis.  The patient denied any cocaine use prior to the event and also states that he has been in the usual state of health without any recent fevers, chills, chest pain, or abdominal pain.  In the ED, the patient was in respiratory distress and tachycardic, was placed on BiPAP after receiving nebulizer treatment on evaluation.  OUTPATIENT PRESCRIPTIONS:  Albuterol, Symbicort, prednisone.  ALLERGIES:  ASPIRIN.  PAST MEDICAL HISTORY:  COPD, obesity, substance abuse.  PHYSICAL EXAMINATION:  VITAL SIGNS:  On admission, temperature 98.3, blood pressure 146/76, initially 226/102, pulse 117-123, respiratory rate 18-32, satting 93% on 3 liters BiPAP. GENERAL:  Alert, in moderate distress. HEAD:  Normocephalic, atraumatic. THROAT:  Unable to assess due to BiPAP. NECK:  No carotid bruits.  No JVD.  Neck was supple. LUNGS:  Diffuse expiratory wheezes.  No crackles or rales. HEART:  Tachycardic.  Normal S1-S2.  No murmurs, gallops, or rubs. ABDOMEN:  Soft, nontender.  Normal bowel sounds. EXTREMITIES:  Normal, atraumatic.  No cyanosis or edema.  Pulses 2+ and symmetric. NEUROLOGIC:  Grossly normal.  LABS ON ADMISSION:  I-STAT chemistry CO2 29, ionized calcium 1.5, hemoglobin 17.0, hematocrit 60.0.  Sodium 145, potassium 3.7, chloride 108,  glucose 136, BUN of 12, creatinine of 1.5.  UDS was positive for cocaine.  Cardiac enzymes negative.  However, total creatinine kinase was 364.  HOSPITAL COURSE BY PROBLEM: 1. COPD exacerbation:  Given the patient's increased oxygen     requirement and significant shortness of breath with prior history     of multiple COPD exacerbations, this is a most likely etiology of     the patient's shortness of breath during this admission.  This was     likely initiated by allergic reaction to aspirin, which he has had     in the past.  The patient did not realize that Goody's powders     contained aspirin and was instructed to avoid these in the future.     The patient was started on bronchodilators, Avelox, as well as a     prednisone taper, and by hospital day #2, his symptoms had     completely resolved.  Given concern over potential ACS, the     patient's cardiac enzymes were cycled and were negative x3.  EKG     did not demonstrate any concerning ischemic changes.  At the time     of discharge, the patient was in improved and stable condition and     sent home with 4 days of Avelox as well as a prednisone taper. 2. Elevated blood pressure:  Systolic blood pressure was in the 200     range.  Initially this would likely secondary to cocaine     intoxication given his positive UDS.  The patient's blood pressure     improved dramatically throughout the hospital stay, and at the time     of discharge, it was not felt that any new antihypertensives needed     to be added to his regimen.  I would recommend continued monitoring     of the patient's blood pressure as an outpatient and titration of     medications as needed. 3. Substance abuse, UDS was positive for cocaine and the patient     does admit to handling this substance.  The patient has been     instructed that he should avoid cocaine in any fashion given that     it was found in his urine.  The patient is aware of the negative      health risks of the use of cocaine.  DISCHARGE VITAL SIGNS:  At the time of discharge, the patient's temperature was 98.2, blood pressure 121/61, heart rate 75, respiratory rate 18, he was breathing 94% on room air.     Sinda Du, MD   ______________________________ Ileana Roup, M.D.    BB/MEDQ  D:  01/13/2011  T:  01/14/2011  Job:  [id number]  cc:   [facility]  Electronically Signed  by Sinda Du MD on 01/30/2011 10:31:19 AM Electronically Signed by Margarito Liner M.D. on 02/08/2011 05:52:16 PM

## 2011-02-14 NOTE — Discharge Summary (Signed)
NAMEJAMARQUES, [name] NO.:  [id number]  MEDICAL RECORD NO.:  [id number]           PATIENT TYPE:  I  LOCATION:  5150                         FACILITY:  [facility]  PHYSICIAN:  [name], M.D.  DATE OF BIRTH:  04-11-1961  DATE OF ADMISSION:  11/22/2010 DATE OF DISCHARGE:  11/24/2010                              DISCHARGE SUMMARY   DISCHARGE DIAGNOSES: 1. Chronic obstructive pulmonary disease exacerbation likely secondary     to unavailability of inhalers and might be a component of     infection. 2. Substance abuse, history of positive cocaine in February 2012.  DISCHARGE MEDICATIONS: 1. Albuterol inhaler 2 puffs every 4 hours as needed inhaled for     shortness of breath. 2. Spiriva 18 mcg 1 capsule inhaled daily. 3. Prednisone taper 60 mg for 3 days, 5 mg for 3 days, 20 mg for 3     days, and then 10 mg for 2 days. 4. Advil 200 mg tablets every 6 hours p.r.n. for pain. 5. Aspirin 81 mg tablet p.o. daily. 6. Avelox 400 mg p.o. daily a total of 5 days for completing a week of     therapy for questionable pneumonia.  DISPOSITION AND FOLLOWUP:  [name] is to be discharged in a relatively stable condition.  He is to be followed up with [name] at [facility] on November 29, 2010, at 10:15 a.m.  During the follow up, please check for his shortness of breath and evaluate for response to the therapy for his COPD exacerbation.  Also if he is able to get his inhalers with orange card as an outpatient because this was cause of his ED visit at admission because he was not able to get his inhalers from the pharmacy.  CHIEF COMPLAINT:  Coughing.  HISTORY OF PRESENT ILLNESS:  [name] is a 50-year man with a significant medical history who presented with a 6-month history of shortness of breath and cough.  He was recently admitted for COPD exacerbation on September 25, 2010, after hospital discharge, he completed a 5-day course of azithromycin  and prednisone tapering dose.  He reports that he could not afford inhalers  because there was a problem with paperwork with the orange card.  He has cough that is intermittent and that the day prior to admission his cough and shortness of breath became worse so, he went to rest in the ED yesterday, November 21, 2010, and was given IV antibiotics and breathing treatment and was sent home.  He coughed up yellow phlegm without any blood.  He has chills, but denies any fever with temperature of 99.7 measured.  He denies any recent travel or sick contacts, admits to nausea, but denies any vomiting.  He was given prescription for azithromycin from [facility]; however, due to financial strain, he was unable to fill it.  The patient states that he has an  appointment at [facility] for his registration this Thursday.  He denies headache, or changes in urinary or bowel habits.  ALLERGIES:  No known drug allergies.  PHYSICAL  EXAMINATION:  VITAL SIGNS:  On admission, temperature 98.8, blood pressure 140/81, heart rate 115, respiratory rate 20, and O2 sats 84% on room air. GENERAL:  NAD.  Well-developed, normal speech. HEAD:  NCAT. EYES:  PERRLA. EOMI.  No icterus or pallor. THROAT:  MMM. NECK:  Supple.  No JVD.  No lymphadenopathy. LUNGS:  Mild occasional expiratory wheezes, decreased breath sounds bilaterally at the bases.  No crackles or rhonchi. CVS:  Regular rate and rhythm.  No murmur, rubs, or gallop. ABDOMEN:  Soft, nondistended, nontender.  Normal bowel sounds.  No masses. EXTREMITIES:  No clubbing, cyanosis, or edema. NEURO:  Alert and oriented x3.  Cranial nerves II through XII grossly intact.  A 5/5 motor strength in upper and lower extremities.  DTRs intact.  Sensory intact to light touch. LYMPH:  No lymphadenopathy. SKIN:  No rashes.  No lesions.  LABS ON ADMISSION:  WBC 12.1, ANC 8.7, hemoglobin 13.9, hematocrit 41.5, and platelets 298.  Sodium 141, potassium  4.0, chloride 111, bicarb 24, glucose 86, BUN 9, and creatinine 1.18.  GFR more than 60 and calcium 0.8.  HOSPITAL COURSE BY PROBLEM: 1. COPD exacerbation.  [name] was admitted twice in the last 3 months     and last 2 months for COPD exacerbation.  The recent exacerbation     was triggered due to nonavailability of inhalers and medications.     He got better overnight, after getting the nebulizer treatment with     DuoNeb and getting the Avelox p.o.  He was afebrile and his cough     got much better with the treatment.  Social work consult was placed     to help out with discharge medications and we were able to give him     about 3 days of supply of inhalers and full supply of his     antibiotics and prednisone taper to take home.  He said he had a     pack of Medicaid recently and he already has orange card and he had     an appointment at the [facility] Department tomorrow that is     Thursday, November 25, 2010, and will probably get inhalers from the     orange card.  He has an appointment at [facility]     by Dr. Sherryll Ali on Monday, November 29, 2010, at 10:15 a.m. and we will     assess for the ability of inhalers with orange card or if not, we     will help him out and at that point of time to get his regular     inhalers with the [facility] office. 2. Polysubstance abuse.  He was cocaine positive during the last     admission in February 2012, but this time, UDS was negative for     cocaine.  Substance abuse counseling was placed and he was given     the adequate resources to help avoid illicit drug use.  He will be     followed up at [facility] and if needed, we will     give more resources to help him out with substance abuse.  DISCHARGE VITALS:  Temperature 98.6, pulse 88, respirations 19, blood pressure 148/68, and O2 sats 95% on room air.  DISCHARGE LABS:  BMET, sodium 142, potassium 3.9, chloride 108, bicarb 26, BUN 10, and creatinine  1.14.  GFR more than 60, calcium 8.8, glucose 201.  CBC, WBC of 13.8, hemoglobin  13.6, hematocrit 41.1, and platelets 309.  TSH 0.079 with free T4 0.91.  Of note, his repeat TSH and T4 to evaluate for hyperthyroidism during the outpatient clinic visit in about 4-6 weeks from now.  He is asymptomatic as of now in terms of hyperthyroidism.    ______________________________ Lyn Hollingshead, MD   ______________________________ [name], M.D.    RP/MEDQ  D:  11/25/2010  T:  11/26/2010  Job:  [id number]  cc:   Austin Lav, MD PhD  Electronically Signed by Lyn Hollingshead MD on 12/18/2010 08:18:04 PM Electronically Signed by Lina Sayre M.D. on 02/14/2011 03:23:39 PM

## 2011-02-15 ENCOUNTER — Encounter: Payer: Self-pay | Admitting: Internal Medicine

## 2011-03-10 ENCOUNTER — Ambulatory Visit (INDEPENDENT_AMBULATORY_CARE_PROVIDER_SITE_OTHER): Payer: Self-pay | Admitting: Internal Medicine

## 2011-03-10 ENCOUNTER — Encounter: Payer: Self-pay | Admitting: Internal Medicine

## 2011-03-10 VITALS — BP 118/79 | HR 100 | Temp 98.7°F | Ht 72.0 in | Wt 222.0 lb

## 2011-03-10 DIAGNOSIS — J45909 Unspecified asthma, uncomplicated: Secondary | ICD-10-CM

## 2011-03-10 DIAGNOSIS — R946 Abnormal results of thyroid function studies: Secondary | ICD-10-CM

## 2011-03-10 DIAGNOSIS — R739 Hyperglycemia, unspecified: Secondary | ICD-10-CM

## 2011-03-10 DIAGNOSIS — Z7189 Other specified counseling: Secondary | ICD-10-CM

## 2011-03-10 DIAGNOSIS — R7309 Other abnormal glucose: Secondary | ICD-10-CM

## 2011-03-10 DIAGNOSIS — M549 Dorsalgia, unspecified: Secondary | ICD-10-CM

## 2011-03-10 MED ORDER — TRAMADOL HCL 50 MG PO TABS: 50.0000 mg | ORAL_TABLET | Freq: Three times a day (TID) | ORAL | Status: AC | PRN

## 2011-03-10 MED ORDER — ACETAMINOPHEN 325 MG PO TABS: 650.0000 mg | ORAL_TABLET | Freq: Four times a day (QID) | ORAL | Status: DC | PRN

## 2011-03-10 NOTE — Progress Notes (Signed)
Subjective:    Patient ID: [name], male    DOB: 1960-11-27, 50 y.o.   MRN: [id number]  HPI  [name] is a 50 year old man with past medical history significant for COPD, cocaine abuse, chronic back pain, and obesity. He presents for a followup regarding his back pain.   At [name] last visit we prescribed him tramadol 50 mg every 8 hours as well as Tylenol 325 mg every 6 hours. Since that time he has not noticed an improvement of his back pain. He states the tramadol has only provided him marginal benefit but he only uses it twice a day. He has not been taking Tylenol at all. He states the pain is in his back and radiates to his right shoulder is approximately 5/10 today. Of note it was approximately 9/10 when untreated previously. He states he is not taking any other medications. And gets shortness of breath when taking nonsteroidal anti-inflammatory such as ibuprofen. He states that the pain is generally tolerable though limits his activities of daily life sometimes.  [name] also endorses episodes of cough and shortness of breath every night for the past month. These are occasionally productive for thick dark green sputum. There is no blood in his sputum. He states that he denies any fevers, chills, fatigue, or malaise. He also does not use his inhaler correctly only breathing he in slightly when using his inhaler. He states he uses his albuterol inhaler 2 puffs once daily. He also uses his Symbicort inhaler 2 puffs twice a day. This has been his baseline level use which is not increased during this episode.     Review of Systems  Constitutional: Negative for fever, chills, weight loss, malaise/fatigue and diaphoresis.  HENT: Negative for congestion and neck pain.   Respiratory: Positive for cough, sputum production and shortness of breath. Negative for hemoptysis and wheezing.   Cardiovascular: Negative for chest pain, palpitations and orthopnea.  Gastrointestinal: Negative for nausea,  vomiting, abdominal pain, diarrhea, constipation, blood in stool and melena.  Musculoskeletal: Positive for back pain and joint pain. Negative for falls.  Skin: Negative for rash.  Neurological: Negative for weakness and headaches.   Review of Systems  Constitutional: Negative for fever, chills, weight loss, malaise/fatigue and diaphoresis.  HENT: Negative for congestion and neck pain.   Respiratory: Positive for cough, sputum production and shortness of breath. Negative for hemoptysis and wheezing.   Cardiovascular: Negative for chest pain, palpitations and orthopnea.  Gastrointestinal: Negative for nausea, vomiting, abdominal pain, diarrhea, constipation, blood in stool and melena.  Musculoskeletal: Positive for back pain and joint pain. Negative for falls.  Skin: Negative for rash.  Neurological: Negative for weakness and headaches.       Objective:   Physical Exam  Physical Exam: General: Vital signs reviewed and noted. Well-developed, well-nourished, in no acute distress.  [name] smells of tobacco smoke during this visit.  Head: Normocephalic, atraumatic. Eyes: PERRL, EOMI, No signs of anemia or jaundince. Nose: Mucous membranes moist, not inflammed, nonerythematous. Poor dentition present with many missing teeth and broken teeth.  Throat: Oropharynx nonerythematous, no exudate appreciated.  Neck: No deformities, masses, or tenderness noted. neck is supple  Lungs: Normal respiratory effort. Clear to auscultation BL without crackles or wheezes. Heart: RRR. S1 and S2 normal without gallop, murmur, or rubs. Abdomen: BS normoactive. Soft, Nondistended, non-tender. No masses or organomegaly. Deep palpation limited due to body habitus.  Extremities: No pretibial edema. Neurologic: A&O X3, CN II - XII are  grossly intact. No focal neurologic deficits           Assessment & Plan:

## 2011-03-10 NOTE — Assessment & Plan Note (Signed)
As [facility] did not followup with Mr. Carino regarding his colonoscopy we will recheck with them next month when they are on-call. Smoking cessation counseling was given. Mr. Chrismer did not attend the diet course we enrolled him in at his last visit.

## 2011-03-10 NOTE — Assessment & Plan Note (Signed)
[name] random glucose was normal as previous visit. It is unlikely that he has diabetes. A1c not indicated at this time

## 2011-03-10 NOTE — Assessment & Plan Note (Addendum)
[name] is currently undergoing a mild exacerbation of his COPD.  This is likely due to not using his inhalers correctly, and under treatment of his symptoms of shortness of breath and cough. He is likely not getting full benefit from his Symbicort and albuterol inhalers. Proper inhaler technique was reviewed during the visit today.  [name] was told to exhale completely, then inhale deeply and fully while activating the inhaler. He was then told to hold his breath in for at least 7-10 seconds. This would count as one puff.   As [name] again smells of tobacco smoke at today's visit smoking cessation counseling was offered as well as secondhand smoke avoidance.  We discussed how smoke, NSAIDs, dust and pollen will likely all trigger his asthma.  This intervention is likely all that will be needed to treat his cough and shortness of breath.  We will have [name] followup in 6 weeks or sooner if he is having increased shortness of breath.  Marland Kitchen

## 2011-03-10 NOTE — Assessment & Plan Note (Signed)
TSH was only mildly elevated during hospitalization. Given his lack of symptoms we will defer repeat testing at this time. However if [name] develop symptoms of hyperthyroidism such as heat intolerance, tachycardia not in the setting of albuterol use, nervousness, or GI symptoms we will reassess this issue.

## 2011-03-10 NOTE — Assessment & Plan Note (Addendum)
[name] back pain is chronic in nature. CRP and sedimentation rate were normal after last visit. [name] was informed that his back pain while chronic most likely benign in nature.  Treatment options are limited due to his recent use of and addiction to cocaine as well as his intolerance to NSAIDs. As [name] had previously had good benefit from Tylenol dropping his pain from 9/10 down to 4-10 and he is currently not taking this medication, we will encourage him to restart taking Tylenol. He was told to take 650 mg every 6 hours as needed for pain. He was also told he could take his tramadol every 8 hours as needed. We also placed a referral today for physical therapy. We will follow him in 6 weeks to determine if these interventions have helped.

## 2011-03-10 NOTE — Patient Instructions (Signed)
  Continue to take Tramadol 1 tablet Every 8 hours as needed for your pain.  Also take tylenol 650 mg every 6 hours as needed for pain.  Practice good inhaler technique (exhale completely, then take a deep breath in while holding down inhaler button, then HOLD your breath in for 7-10 seconds.  Follow up in 4-6 weeks with Dr. Candy Sledge.

## 2011-03-10 NOTE — Assessment & Plan Note (Signed)
>>  ASSESSMENT AND PLAN FOR HYPERGLYCEMIA WRITTEN ON 03/10/2011  6:22 PM BY RAISCH, MICHAEL C, MD  Mr. Stemen random glucose was normal as previous visit. It is unlikely that he has diabetes. A1c not indicated at this time

## 2011-03-15 ENCOUNTER — Ambulatory Visit: Payer: Self-pay | Attending: Internal Medicine | Admitting: Physical Therapy

## 2011-03-15 DIAGNOSIS — M2560 Stiffness of unspecified joint, not elsewhere classified: Secondary | ICD-10-CM | POA: Insufficient documentation

## 2011-03-15 DIAGNOSIS — M6281 Muscle weakness (generalized): Secondary | ICD-10-CM | POA: Insufficient documentation

## 2011-03-15 DIAGNOSIS — M545 Low back pain, unspecified: Secondary | ICD-10-CM | POA: Insufficient documentation

## 2011-03-15 DIAGNOSIS — IMO0001 Reserved for inherently not codable concepts without codable children: Secondary | ICD-10-CM | POA: Insufficient documentation

## 2011-03-18 ENCOUNTER — Other Ambulatory Visit: Payer: Self-pay | Admitting: *Deleted

## 2011-03-18 MED ORDER — TIOTROPIUM BROMIDE MONOHYDRATE 18 MCG IN CAPS: 18.0000 ug | ORAL_CAPSULE | Freq: Every day | RESPIRATORY_TRACT | Status: DC

## 2011-03-21 NOTE — Telephone Encounter (Signed)
Called to pharm 

## 2011-03-22 ENCOUNTER — Ambulatory Visit: Payer: Self-pay | Admitting: Physical Therapy

## 2011-03-22 ENCOUNTER — Telehealth: Payer: Self-pay | Admitting: *Deleted

## 2011-03-22 MED ORDER — ALBUTEROL SULFATE HFA 108 (90 BASE) MCG/ACT IN AERS: 2.0000 | INHALATION_SPRAY | Freq: Four times a day (QID) | RESPIRATORY_TRACT | Status: DC | PRN

## 2011-03-22 NOTE — Telephone Encounter (Signed)
Last given inhaler 5/29   Also on nebulizer....Marland KitchenMarland Kitchen

## 2011-03-23 ENCOUNTER — Ambulatory Visit: Payer: Self-pay | Admitting: Physical Therapy

## 2011-03-23 NOTE — Telephone Encounter (Signed)
Please add # of inhalers a month and # of refills.  thanks

## 2011-03-24 ENCOUNTER — Other Ambulatory Visit: Payer: Self-pay | Admitting: Internal Medicine

## 2011-03-24 NOTE — Telephone Encounter (Signed)
1 inhaler with 3 refills.

## 2011-03-24 NOTE — Telephone Encounter (Signed)
Clarification of order, proventil Rx sig is #1 with 3 refills per Dr Coralee Pesa.

## 2011-03-30 ENCOUNTER — Encounter: Payer: Self-pay | Admitting: Physical Therapy

## 2011-03-31 ENCOUNTER — Encounter: Payer: Self-pay | Admitting: Physical Therapy

## 2011-04-05 ENCOUNTER — Encounter: Payer: Self-pay | Admitting: Physical Therapy

## 2011-04-07 ENCOUNTER — Ambulatory Visit: Payer: Self-pay | Admitting: Physical Therapy

## 2011-04-13 ENCOUNTER — Ambulatory Visit: Payer: Self-pay | Attending: Internal Medicine | Admitting: Physical Therapy

## 2011-04-13 DIAGNOSIS — M6281 Muscle weakness (generalized): Secondary | ICD-10-CM | POA: Insufficient documentation

## 2011-04-13 DIAGNOSIS — IMO0001 Reserved for inherently not codable concepts without codable children: Secondary | ICD-10-CM | POA: Insufficient documentation

## 2011-04-13 DIAGNOSIS — M545 Low back pain, unspecified: Secondary | ICD-10-CM | POA: Insufficient documentation

## 2011-04-13 DIAGNOSIS — M2560 Stiffness of unspecified joint, not elsewhere classified: Secondary | ICD-10-CM | POA: Insufficient documentation

## 2011-04-14 NOTE — Telephone Encounter (Signed)
This note was opened in error.

## 2011-04-18 ENCOUNTER — Ambulatory Visit: Payer: Self-pay | Admitting: Physical Therapy

## 2011-04-20 ENCOUNTER — Encounter: Payer: Self-pay | Admitting: Physical Therapy

## 2011-04-21 ENCOUNTER — Emergency Department (HOSPITAL_COMMUNITY): Payer: Self-pay

## 2011-04-21 ENCOUNTER — Emergency Department (HOSPITAL_COMMUNITY)
Admission: EM | Admit: 2011-04-21 | Discharge: 2011-04-21 | Disposition: A | Discharge status: Home / Self Care | Payer: Self-pay | Attending: Emergency Medicine | Admitting: Emergency Medicine

## 2011-04-21 ENCOUNTER — Ambulatory Visit: Payer: Self-pay | Admitting: Rehabilitative and Restorative Service Providers"

## 2011-04-21 ENCOUNTER — Inpatient Hospital Stay (INDEPENDENT_AMBULATORY_CARE_PROVIDER_SITE_OTHER)
Admission: RE | Admit: 2011-04-21 | Discharge: 2011-04-21 | Disposition: A | Discharge status: Home / Self Care | Payer: Self-pay | Source: Ambulatory Visit | Attending: Emergency Medicine | Admitting: Emergency Medicine

## 2011-04-21 DIAGNOSIS — J45909 Unspecified asthma, uncomplicated: Secondary | ICD-10-CM | POA: Insufficient documentation

## 2011-04-21 DIAGNOSIS — R0902 Hypoxemia: Secondary | ICD-10-CM

## 2011-04-21 DIAGNOSIS — I10 Essential (primary) hypertension: Secondary | ICD-10-CM | POA: Insufficient documentation

## 2011-04-21 DIAGNOSIS — R059 Cough, unspecified: Secondary | ICD-10-CM | POA: Insufficient documentation

## 2011-04-21 DIAGNOSIS — R0602 Shortness of breath: Secondary | ICD-10-CM | POA: Insufficient documentation

## 2011-04-21 DIAGNOSIS — R05 Cough: Secondary | ICD-10-CM | POA: Insufficient documentation

## 2011-04-21 LAB — CBC
MCV: 89.1 fL (ref 78.0–100.0)
Platelets: 275 10*3/uL (ref 150–400)
RBC: 5.05 MIL/uL (ref 4.22–5.81)
WBC: 11.2 10*3/uL — ABNORMAL HIGH (ref 4.0–10.5)

## 2011-04-21 LAB — DIFFERENTIAL
Basophils Absolute: 0 10*3/uL (ref 0.0–0.1)
Eosinophils Absolute: 0.2 10*3/uL (ref 0.0–0.7)
Lymphs Abs: 2 10*3/uL (ref 0.7–4.0)
Neutrophils Relative %: 71 % (ref 43–77)

## 2011-04-21 LAB — BASIC METABOLIC PANEL
CO2: 28 mEq/L (ref 19–32)
Chloride: 106 mEq/L (ref 96–112)
Creatinine, Ser: 1.29 mg/dL (ref 0.50–1.35)
Potassium: 3.3 mEq/L — ABNORMAL LOW (ref 3.5–5.1)
Sodium: 140 mEq/L (ref 135–145)

## 2011-04-22 ENCOUNTER — Ambulatory Visit (INDEPENDENT_AMBULATORY_CARE_PROVIDER_SITE_OTHER): Payer: Self-pay | Admitting: Internal Medicine

## 2011-04-22 ENCOUNTER — Encounter: Payer: Self-pay | Admitting: Internal Medicine

## 2011-04-22 DIAGNOSIS — M549 Dorsalgia, unspecified: Secondary | ICD-10-CM

## 2011-04-22 DIAGNOSIS — Z23 Encounter for immunization: Secondary | ICD-10-CM

## 2011-04-22 DIAGNOSIS — R030 Elevated blood-pressure reading, without diagnosis of hypertension: Secondary | ICD-10-CM

## 2011-04-22 DIAGNOSIS — J45909 Unspecified asthma, uncomplicated: Secondary | ICD-10-CM

## 2011-04-22 DIAGNOSIS — S025XXA Fracture of tooth (traumatic), initial encounter for closed fracture: Secondary | ICD-10-CM

## 2011-04-22 DIAGNOSIS — I10 Essential (primary) hypertension: Secondary | ICD-10-CM | POA: Insufficient documentation

## 2011-04-22 NOTE — Assessment & Plan Note (Signed)
Overall Mr. Ketchum is doing better with no nighttime symptoms and fewer attacks of SOB. I reinforced today that he should decrease albuterol use to only when he is having an asthma attack and not just coughing. The goal is to need it less than 2/week. I also discussed how repeated use of albuterol makes it less effective and can lead to episodes when it will not respond when he needs it. I reinforced that the most important thing I can tell him is to use his Symbicort 2 puffs twice daily everyday. He is to do this regardless of whether he is having symptoms and treat this medicine like a blood pressure medicine that he just takes everyday.    Dr. Rogelia Boga brought up the interesting point of potential allergic response to grass, as his attack last night happened after mowing the lawn. This is supported by his NSAID induced SOB, however the lack of previous allergies makes me reluctant to start Loratadine at this time. I will see Mr. Rhodes back in 5-6 months (late Feb), which is before the spring allergy season gets into full swing to assess how his breathing is doing. We will readdress his allergy status then.  He knows to call if he needs to come in sooner.    -- new prescription faxed today to [facility] for Nebulizer -- flu shot and pneumovax given -- f/u in 5-6 months with me

## 2011-04-22 NOTE — Patient Instructions (Addendum)
The proper technique for using your inhaler is as follows: Take a deep breath in and exhale completely. With you next deep breath in, activate your inhaler and inhale the medication as deeply as you can. Hold this breath in for 7 full seconds. This counts as one puff.  MAKE SURE YOU USE YOUR SYMBICORT INHALER TWICE DAILY  EVERYDAY.  Return to clinic to see Dr. Candy Sledge in 5 months. Please bring all your medications to your next clinic appointment.   Insomnia Insomnia is frequent trouble falling and/or staying asleep. Insomnia can be a long term problem or a short term problem. Both are common. Insomnia can be a short term problem when the wakefulness is related to a certain stress or worry. Long term insomnia is often related to ongoing stress during waking hours and/or poor sleeping habits. Overtime, sleep deprivation itself can make the problem worse. Every little thing feels more severe because you are overtired and your ability to cope is decreased. SYMPTOMS  Not feeling rested in the morning.   Anxiety and restlessness at bedtime.   Difficulty falling and staying asleep.  CAUSES  Stress, anxiety, and depression.   Poor sleeping habits.   Distractions such as TV in the bedroom.   Naps close to bedtime.   Engaging in emotionally charged conversations before bed.   Technical reading before sleep.   Alcohol and other sedatives. They may make the problem worse. They can hurt normal sleep patterns and normal dream activity.   Stimulants such as caffeine for several hours prior to bedtime.   Pain syndromes and shortness of breath can cause insomnia.   Exercise late at night.   Changing time zones may cause sleeping problems (jet lag).  It is sometimes helpful to have someone observe your sleeping patterns. They should look for periods of not breathing during the night (sleep apnea). They should also look to see how long those periods last. If you live alone or observers are  uncertain, you can also be observed at a sleep clinic where your sleep patterns will be professionally monitored. Sleep apnea requires a checkup and treatment. Give your caregivers your medical history. Give your caregivers observations your family has made about your sleep.  TREATMENT  Your caregiver may prescribe treatment for an underlying medical disorders. Your caregiver can give advice or help if you are using alcohol or other drugs for self-medication. Treatment of underlying problems will usually eliminate insomnia problems.   Medications can be prescribed for short time use. They are generally not recommended for lengthy use.   Over-the-counter sleep medicines are not recommended for lengthy use. They can be habit forming.   You can promote easier sleeping by making lifestyle changes such as:   Using relaxation techniques that help with breathing and reduce muscle tension.   Exercising earlier in the day.   Changing your diet and the time of your last meal. No night time snacks.   Establish a regular time to go to bed.   Counseling can help with stressful problems and worry.   Soothing music and white noise may be helpful if there are background noises you cannot remove.   Stop tedious detailed work at least one hour before bedtime.  HOME CARE INSTRUCTIONS  Keep a diary. Inform your caregiver about your progress. This includes any medication side effects. See your caregiver regularly. Take note of:   Times when you are asleep.  Times when you are awake during the night.   The quality of  your sleep.  How you feel the next day.   This information will help your caregiver care for you.  Get out of bed if you are still awake after 15 minutes. Read or do some quiet activity. Keep the lights down. Wait until you feel sleepy and go back to bed.   Keep regular sleeping and waking hours. Avoid naps.   Exercise regularly.   Avoid distractions at bedtime. Distractions include  watching television or engaging in any intense or detailed activity like attempting to balance the household checkbook.   Develop a bedtime ritual. Keep a familiar routine of bathing, brushing your teeth, climbing into bed at the same time each night, listening to soothing music. Routines increase the success of falling to sleep faster.   Use relaxation techniques. This can be using breathing and muscle tension release routines. It can also include visualizing peaceful scenes. You can also help control troubling or intruding thoughts by keeping your mind occupied with boring or repetitive thoughts like the old concept of counting sheep. You can make it more creative like imagining planting one beautiful flower after another in your backyard garden.   During your day, work to eliminate stress. When this is not possible use some of the previous suggestions to help reduce the anxiety that accompanies stressful situations.  MAKE SURE YOU:   Understand these instructions.   Will watch your condition.   Will get help right away if you are not doing well or get worse.  Document Released: 07/15/2000 Document Re-Released: 06/30/2008 [facility] Patient Information 2011 [place], [place].  Chronic Obstructive Pulmonary Disease (COPD) Chronic obstructive pulmonary disease (COPD) is a condition in which airflow from the lungs is restricted. The lungs can never return to normal, but there are measures you can take which will improve them and make you feel better. CAUSES  Smoking.   Breathing in irritants (pollution, cigarette smoke, strong odors, aerosol sprays, paint fumes).   History of lung infections.  TREATMENT  Treatment focuses on making you comfortable (supportive care).  HOME CARE INSTRUCTIONS  If you smoke, stop smoking. The carbon monoxide buildup in the blood robs you of your already short oxygen supply.   Take medicines (antibiotics) that kill germs as directed.   Avoid antihistamines  and cough syrups. They dry up your system and slow down the elimination of secretions. This decreases respiratory capacity and may lead to infections.   Drink enough water and fluids to keep your urine clear or pale yellow. This loosens secretions.   Use humidifiers at home and at your bedside if they do not make breathing difficult.   Receive all protective vaccines your caregiver suggests, especially pneumococcal and influenza.   Use home oxygen as suggested.  SEEK MEDICAL CARE IF:  You develop pus-like mucus (sputum).   You have an oral temperature above 101.5   Breathing is more labored or exercise becomes difficult to do.   You are running out of the medicine you take for your breathing.  SEEK IMMEDIATE MEDICAL CARE IF:  You have a rapid heart rate.   You have agitation, confusion, tremors, or are in a stupor (family members may need to observe this).   It becomes difficult to breathe.   You develop chest pain.   You have an oral temperature above 101.5, not controlled by medicine.  MAKE SURE YOU:   Understand these instructions.   Will watch your condition.   Will get help right away if you are not doing well or  get worse.  Document Released: 04/27/2005 Document Re-Released: 10/12/2009 [facility] Patient Information 2011 [place], [place].

## 2011-04-22 NOTE — Progress Notes (Deleted)
  Subjective:    Patient ID: [name], male    DOB: 1961-02-19, 50 y.o.   MRN: [id number]  HPI    Review of Systems     Objective:   Physical Exam        Assessment & Plan:

## 2011-04-22 NOTE — Progress Notes (Signed)
Subjective:   Patient ID: [name] male   DOB: 1961-07-04 50 y.o.   MRN: [id number]  HPI: [name] is a 50 y.o. man PMH of Asthma and chronic back pain presents for follow-up of his asthma.  [name] states that he was in the ED last night for asthma attack. He states that he mowed the lawn and after doing so, had SOB and cough. He did not have fevers or chills. He did not have a feeling of sinus congestion, sputum production, malaize or other complaint. He did endorse rhinorrhea. He states that his albuterol stopped working and he went to the ED. He has not yet gotten his new nebulizer machine. He states that the ED gave him a breathing treatment and is now feeling at baseline. He denies any significant seasonal allergies. He states that this is the first time he has had this reaction to grass. He denies other allergy problems.  In general he reports that he is doing much better with his SOB. With the improved technique of using his inhalers, he is having much less sob. He has not had night symptoms in over 1 month, though he does endorse waking up (not SOB) at around 2 AM nightly.  He uses albuterol daily when he has a coughing attack. He only is using symbicort QOD.    Mr. Heffern reports that the PT has helped him with his back pain. He appears very happy when talking about decreased need for pain meds now that he is doing PT. He only uses tylenol now post PT as he is sore.  Regarding the pain he now states, "I can deal with it," whereas before he requested increased tramadol or stronger pain medicine.   [name] states he would like a referral to a dentist. He has a broken right upper molar that is split in 1/2.  He states this is very painful and is not relieved by his current pain regimen.   Past Medical History  Diagnosis Date  . COPD (chronic obstructive pulmonary disease)     exas 6/13, potentially due to ASA use.   . Obesity (BMI 30-39.9)   . Substance abuse     cocaine 35 years ago    Current Outpatient Prescriptions  Medication Sig Dispense Refill  . acetaminophen (TYLENOL) 325 MG tablet Take 2 tablets (650 mg total) by mouth every 6 (six) hours as needed.  30 tablet  3  . albuterol (PROVENTIL HFA) 108 (90 BASE) MCG/ACT inhaler Inhale 2 puffs into the lungs every 6 (six) hours as needed for wheezing.      . budesonide-formoterol (SYMBICORT) 160-4.5 MCG/ACT inhaler Take 2 puffs first thing in am and then another 2 puffs about 12 hours later.     1 Inhaler  12  . tiotropium (SPIRIVA) 18 MCG inhalation capsule Place 1 capsule (18 mcg total) into inhaler and inhale daily.  90 capsule  3  . traMADol (ULTRAM) 50 MG tablet Take 1 tablet (50 mg total) by mouth every 8 (eight) hours as needed for pain.  30 tablet  0  . albuterol (PROVENTIL) (2.5 MG/3ML) 0.083% nebulizer solution Take 3 mLs (2.5 mg total) by nebulization every 4 (four) hours as needed for wheezing.  25 vial  2   Family History  Problem Relation Age of Onset  . Pancreatic cancer Mother   . Alcohol abuse Father   . Sarcoidosis Brother    History   Social History  . Marital Status: Single  Spouse Name: N/A    Number of Children: N/A  . Years of Education: N/A   Occupational History  . Unemployed     worked as a Psychologist, counselling History Main Topics  . Smoking status: Former Smoker -- 1.0 packs/day for 15 years    Types: Cigarettes    Quit date: 10/30/2009  . Smokeless tobacco: Never Used  . Alcohol Use: None     Beer on wkends  . Drug Use: No     former cocaine use 35 years ago  . Sexually Active: None     unemployed, was a Investment banker, operational   Other Topics Concern  . None   Social History Narrative  . None   Review of Systems: Constitutional: Denies fever, chills, diaphoresis,  HEENT: Denies congestion, sore throat, or rhinorrhea,  Respiratory: Denies SOB, DOE, cough, chest tightness,  and wheezing.   Cardiovascular: Denies chest pain,  Gastrointestinal: Denies nausea, vomiting, abdominal pain,  diarrhea, constipation, blood in stool and abdominal distention.  Genitourinary: Denies dysuria, urgency,  Musculoskeletal: Endorses back pain Neurological: Denies dizziness and headaches.    Objective:  Physical Exam: Filed Vitals:   04/22/11 1327  BP: 141/78  Pulse: 82  Temp: 98.2 F (36.8 C)  TempSrc: Oral  Height: 6' (1.829 m)  Weight: 224 lb 9.6 oz (101.878 kg)   Constitutional: Vital signs reviewed.  Patient is a well-developed overweight man in no acute distress and cooperative with exam. Alert and oriented x3.  Head: Normocephalic and atraumatic Mouth: no erythema or exudates, MMM Eyes: PERRL, EOMI, conjunctivae normal, No scleral icterus.  Neck: Supple, Trachea midline normal ROM, No JVD. Cardiovascular: RRR, S1 normal, S2 normal, no MRG, pulses symmetric and intact bilaterally Pulmonary/Chest: CTAB, no wheezes, rales, or rhonchi Abdominal: Soft. Non-tender, non-distended, bowel sounds are normal, no masses, organomegaly, or guarding present.  Musculoskeletal: back is non-tender to palpation.  ROM is better today than 6 weeks ago.  Neurological: A&O x3, cranial nerve II-XII are grossly intact, no focal motor deficit, sensory intact to light touch bilaterally.  Skin: Warm, dry and intact. No rash, cyanosis, or clubbing.  Psychiatric: Positive mood today and affect. speech and behavior is normal. Judgment and thought content normal. Cognition and memory are normal.

## 2011-04-25 ENCOUNTER — Encounter: Payer: Self-pay | Admitting: Rehabilitative and Restorative Service Providers"

## 2011-05-04 ENCOUNTER — Ambulatory Visit: Payer: Self-pay | Attending: Internal Medicine | Admitting: Rehabilitative and Restorative Service Providers"

## 2011-05-04 DIAGNOSIS — M545 Low back pain, unspecified: Secondary | ICD-10-CM | POA: Insufficient documentation

## 2011-05-04 DIAGNOSIS — M6281 Muscle weakness (generalized): Secondary | ICD-10-CM | POA: Insufficient documentation

## 2011-05-04 DIAGNOSIS — IMO0001 Reserved for inherently not codable concepts without codable children: Secondary | ICD-10-CM | POA: Insufficient documentation

## 2011-05-04 DIAGNOSIS — M2560 Stiffness of unspecified joint, not elsewhere classified: Secondary | ICD-10-CM | POA: Insufficient documentation

## 2011-05-05 ENCOUNTER — Encounter: Payer: Self-pay | Admitting: Rehabilitative and Restorative Service Providers"

## 2011-05-26 ENCOUNTER — Encounter: Payer: Self-pay | Admitting: Internal Medicine

## 2011-06-07 NOTE — Progress Notes (Signed)
Addended by: Dorie Rank E on: 06/07/2011 06:30 PM   Modules accepted: Orders

## 2011-06-13 ENCOUNTER — Emergency Department (HOSPITAL_COMMUNITY)
Admission: EM | Admit: 2011-06-13 | Discharge: 2011-06-13 | Disposition: A | Discharge status: Home / Self Care | Payer: Self-pay | Attending: Emergency Medicine | Admitting: Emergency Medicine

## 2011-06-13 ENCOUNTER — Encounter (HOSPITAL_COMMUNITY): Payer: Self-pay | Admitting: *Deleted

## 2011-06-13 ENCOUNTER — Emergency Department (HOSPITAL_COMMUNITY): Payer: Self-pay

## 2011-06-13 DIAGNOSIS — J189 Pneumonia, unspecified organism: Secondary | ICD-10-CM | POA: Insufficient documentation

## 2011-06-13 DIAGNOSIS — R0609 Other forms of dyspnea: Secondary | ICD-10-CM | POA: Insufficient documentation

## 2011-06-13 DIAGNOSIS — R0989 Other specified symptoms and signs involving the circulatory and respiratory systems: Secondary | ICD-10-CM | POA: Insufficient documentation

## 2011-06-13 DIAGNOSIS — J45901 Unspecified asthma with (acute) exacerbation: Secondary | ICD-10-CM | POA: Insufficient documentation

## 2011-06-13 DIAGNOSIS — R0682 Tachypnea, not elsewhere classified: Secondary | ICD-10-CM | POA: Insufficient documentation

## 2011-06-13 DIAGNOSIS — R0602 Shortness of breath: Secondary | ICD-10-CM | POA: Insufficient documentation

## 2011-06-13 DIAGNOSIS — J159 Unspecified bacterial pneumonia: Secondary | ICD-10-CM

## 2011-06-13 LAB — POCT I-STAT, CHEM 8
BUN: 12 mg/dL (ref 6–23)
Chloride: 105 mEq/L (ref 96–112)
Sodium: 141 mEq/L (ref 135–145)

## 2011-06-13 LAB — DIFFERENTIAL
Basophils Absolute: 0 10*3/uL (ref 0.0–0.1)
Lymphocytes Relative: 13 % (ref 12–46)
Lymphs Abs: 1.4 10*3/uL (ref 0.7–4.0)
Monocytes Absolute: 0.6 10*3/uL (ref 0.1–1.0)
Neutro Abs: 8.2 10*3/uL — ABNORMAL HIGH (ref 1.7–7.7)

## 2011-06-13 LAB — CBC
HCT: 45.9 % (ref 39.0–52.0)
Platelets: 252 10*3/uL (ref 150–400)
RBC: 5.12 MIL/uL (ref 4.22–5.81)
RDW: 13.8 % (ref 11.5–15.5)
WBC: 10.2 10*3/uL (ref 4.0–10.5)

## 2011-06-13 MED ORDER — DOXYCYCLINE HYCLATE 100 MG PO CAPS: 100.0000 mg | ORAL_CAPSULE | Freq: Two times a day (BID) | ORAL | Status: AC

## 2011-06-13 MED ORDER — ALBUTEROL SULFATE (5 MG/ML) 0.5% IN NEBU
INHALATION_SOLUTION | RESPIRATORY_TRACT | Status: AC
  Administered 2011-06-13: 06:00:00
  Filled 2011-06-13: qty 2

## 2011-06-13 MED ORDER — MAGNESIUM SULFATE IN D5W 10-5 MG/ML-% IV SOLN
1.0000 g | Freq: Once | INTRAVENOUS | Status: DC
  Filled 2011-06-13: qty 100

## 2011-06-13 MED ORDER — IPRATROPIUM BROMIDE 0.02 % IN SOLN
RESPIRATORY_TRACT | Status: AC
  Filled 2011-06-13: qty 2.5

## 2011-06-13 MED ORDER — PREDNISONE 20 MG PO TABS: 60.0000 mg | ORAL_TABLET | Freq: Every day | ORAL | Status: AC

## 2011-06-13 MED ORDER — METHYLPREDNISOLONE SODIUM SUCC 125 MG IJ SOLR
INTRAMUSCULAR | Status: AC
  Administered 2011-06-13: 06:00:00
  Filled 2011-06-13: qty 2

## 2011-06-13 MED ORDER — METHYLPREDNISOLONE SODIUM SUCC 125 MG IJ SOLR
INTRAMUSCULAR | Status: AC
  Filled 2011-06-13: qty 2

## 2011-06-13 MED ORDER — MAGNESIUM SULFATE 50 % IJ SOLN
1.0000 g | Freq: Once | INTRAVENOUS | Status: AC
  Administered 2011-06-13: 1 g via INTRAVENOUS
  Filled 2011-06-13: qty 2

## 2011-06-13 MED ORDER — MAGNESIUM SULFATE 50 % IJ SOLN: 1.0000 g | Freq: Once | INTRAMUSCULAR | Status: DC

## 2011-06-13 MED ORDER — ALBUTEROL SULFATE (5 MG/ML) 0.5% IN NEBU
INHALATION_SOLUTION | RESPIRATORY_TRACT | Status: AC
  Filled 2011-06-13: qty 2

## 2011-06-13 MED ORDER — IPRATROPIUM BROMIDE 0.02 % IN SOLN
RESPIRATORY_TRACT | Status: AC
  Administered 2011-06-13: 06:00:00
  Filled 2011-06-13: qty 2.5

## 2011-06-13 MED ORDER — ALBUTEROL (5 MG/ML) CONTINUOUS INHALATION SOLN
5.0000 mg/h | INHALATION_SOLUTION | RESPIRATORY_TRACT | Status: DC
  Filled 2011-06-13: qty 20

## 2011-06-13 MED ORDER — ALBUTEROL (5 MG/ML) CONTINUOUS INHALATION SOLN: 10.0000 mg | INHALATION_SOLUTION | RESPIRATORY_TRACT | Status: DC

## 2011-06-13 MED ORDER — MOXIFLOXACIN HCL 400 MG PO TABS
400.0000 mg | ORAL_TABLET | Freq: Once | ORAL | Status: AC
  Administered 2011-06-13: 400 mg via ORAL
  Filled 2011-06-13: qty 1

## 2011-06-13 MED ORDER — ALBUTEROL SULFATE HFA 108 (90 BASE) MCG/ACT IN AERS
2.0000 | INHALATION_SPRAY | RESPIRATORY_TRACT | Status: DC | PRN
  Administered 2011-06-13: 2 via RESPIRATORY_TRACT
  Filled 2011-06-13: qty 6.7

## 2011-06-13 NOTE — ED Notes (Signed)
Patient is resting comfortably. VSS. NAD. REsp e/u, no distress

## 2011-06-13 NOTE — ED Provider Notes (Signed)
History     CSN: [id number] Arrival date & time: 06/13/2011  5:25 AM   First MD Initiated Contact with Patient 06/13/11 [phone]      Chief Complaint  Patient presents with  . Asthma    (Consider location/radiation/quality/duration/timing/severity/associated sxs/prior treatment) Patient is a 50 y.o. male presenting with asthma. The history is provided by the patient and the EMS personnel.  Asthma This is a recurrent problem. The current episode started 3 to 5 hours ago. The problem occurs constantly. The problem has been gradually worsening. Associated symptoms include shortness of breath. Pertinent negatives include no chest pain, no abdominal pain and no headaches. Exacerbated by: Any exertion. The symptoms are relieved by nothing. Treatments tried: Called EMS and received Solu-Medrol and albuterol in route. The treatment provided mild relief.   has a history of asthma and multiple medicines in the past. Has never required intubation. He is not certain what his triggers are but he did do a work outside yesterday in the yard. He denies any smoking or tobacco exposure. No fevers or chills. No chest pain or productive sputum. No rashes. No recent illness or known sick contacts.  Past Medical History  Diagnosis Date  . COPD (chronic obstructive pulmonary disease)     exas 6/13, potentially due to ASA use.   . Obesity (BMI 30-39.9)   . Substance abuse     cocaine 35 years ago    Past Surgical History  Procedure Date  . Cholecystectomy 07/30/89  . Pelvic fracture surgery     Family History  Problem Relation Age of Onset  . Pancreatic cancer Mother   . Alcohol abuse Father   . Sarcoidosis Brother     History  Substance Use Topics  . Smoking status: Former Smoker -- 1.0 packs/day for 15 years    Types: Cigarettes    Quit date: 10/30/2009  . Smokeless tobacco: Never Used  . Alcohol Use: Not on file     Beer on wkends      Review of Systems  Constitutional: Negative for  fever and chills.  HENT: Negative for neck pain and neck stiffness.   Eyes: Negative for pain.  Respiratory: Positive for shortness of breath and wheezing.   Cardiovascular: Negative for chest pain, palpitations and leg swelling.  Gastrointestinal: Negative for abdominal pain.  Genitourinary: Negative for dysuria.  Musculoskeletal: Negative for back pain.  Skin: Negative for rash.  Neurological: Negative for headaches.  All other systems reviewed and are negative.    Allergies  Aspirin  Home Medications   Current Outpatient Rx  Name Route Sig Dispense Refill  . ACETAMINOPHEN 325 MG PO TABS Oral Take 2 tablets (650 mg total) by mouth every 6 (six) hours as needed. 30 tablet 3  . ALBUTEROL SULFATE HFA 108 (90 BASE) MCG/ACT IN AERS Inhalation Inhale 2 puffs into the lungs every 6 (six) hours as needed for wheezing.    . BUDESONIDE-FORMOTEROL FUMARATE 160-4.5 MCG/ACT IN AERO  Take 2 puffs first thing in am and then another 2 puffs about 12 hours later.    1 Inhaler 12  . TIOTROPIUM BROMIDE MONOHYDRATE 18 MCG IN CAPS Inhalation Place 1 capsule (18 mcg total) into inhaler and inhale daily. 90 capsule 3  . TRAMADOL HCL 50 MG PO TABS Oral Take 1 tablet (50 mg total) by mouth every 8 (eight) hours as needed for pain. 30 tablet 0    There were no vitals taken for this visit.  Physical Exam  Constitutional: He is  oriented to person, place, and time. He appears well-developed and well-nourished.  HENT:  Head: Normocephalic and atraumatic.  Eyes: Conjunctivae and EOM are normal. Pupils are equal, round, and reactive to light.  Neck: Trachea normal. Neck supple. No thyromegaly present.  Cardiovascular: Normal rate, regular rhythm, S1 normal, S2 normal and normal pulses.     No systolic murmur is present   No diastolic murmur is present  Pulses:      Radial pulses are 2+ on the right side, and 2+ on the left side.  Pulmonary/Chest: He has no rhonchi.       Prolonged expirations with  decreased breath sounds bilaterally and inspiratory and expiratory wheezes throughout lung fields. Mild tachypnea and mild respiratory distress no retractions.  Abdominal: Soft. Normal appearance and bowel sounds are normal. There is no tenderness. There is no CVA tenderness and negative Murphy's sign.  Musculoskeletal:       BLE:s Calves nontender, no cords or erythema, negative Homans sign  Neurological: He is alert and oriented to person, place, and time. He has normal strength. No cranial nerve deficit or sensory deficit. GCS eye subscore is 4. GCS verbal subscore is 5. GCS motor subscore is 6.  Skin: Skin is warm and dry. No rash noted. He is not diaphoretic.  Psychiatric: His speech is normal.       Cooperative and appropriate    ED Course  Procedures (including critical care time)  CRITICAL CARE Performed by: Sunnie Nielsen   Total critical care time: 35  Critical care time was exclusive of separately billable procedures and treating other patients.  Critical care was necessary to treat or prevent imminent or life-threatening deterioration.  Critical care was time spent personally by me on the following activities: development of treatment plan with patient and/or surrogate as well as nursing, discussions with consultants, evaluation of patient's response to treatment, examination of patient, obtaining history from patient or surrogate, ordering and performing treatments and interventions, ordering and review of laboratory studies, ordering and review of radiographic studies, pulse oximetry and re-evaluation of patient's condition.  Serial evaluations and breathing treatments provided. With aggressive respiratory care and albuterol, able to improve and clear wheezing. 8:15 AM wheezes are resolved and patient is resting comfortably and has adequate pulse ox on room air no longer requiring oxygen. Steroids given. I discussed patient care with primary physician who agrees that given the  improvement does not require admission at this time. For questionable pneumonia on chest x-ray, was given antibiotics and a prescription for doxycycline, prednisone and albuterol inhaler.  MDM   Acute asthma exacerbation with history of same. Given IV site Medrol albuterol prior to arrival. Placed on monitor and given continuous albuterol neb treatment and IV magnesium. Patient has history of admissions for asthma in the past has never required intubation. After aggressive treatments as above, stable for discharge home, lung sounds cleared with no respiratory distress on recheck at 8:15 AM        Sunnie Nielsen, MD 06/13/11 [phone]

## 2011-06-13 NOTE — ED Notes (Signed)
RT called for continuous Neb

## 2011-06-13 NOTE — ED Notes (Signed)
Patient is resting comfortably. 

## 2011-06-13 NOTE — ED Notes (Signed)
Patient is resting comfortably. Infusion complete. NAD. No voiced complaints. Resp e/u, no distress

## 2011-06-13 NOTE — ED Notes (Signed)
Per EMS: pt brought in for asthma exertion. Pt states that he woke up with SOB attempted to take a 5 mg albuterol neb treatment. Pt did not feel better after treatment called 911. With EMS pt received 5mg  of Albuterol and 0.5mg  of atrovent. Pt given 125mg  of Solumedrol. Pt currently on NRB mask feels better with O2.

## 2011-06-13 NOTE — ED Notes (Signed)
Asked patient if he needed anything at this time.  Negative.

## 2011-06-13 NOTE — ED Notes (Signed)
Pt states that he has been having a head cold for the past month pt has been using robitussin for relief with no relief. pt states that he did yard work today and that was a lot for him to do in a day. Pt woke up with an asthma exateration.

## 2011-06-13 NOTE — ED Notes (Signed)
Patient is resting comfortably. Denies SOB, no distress, resp e/u. Neb finished. Lungs clear BBS.

## 2011-07-19 ENCOUNTER — Other Ambulatory Visit: Payer: Self-pay | Admitting: *Deleted

## 2011-07-19 MED ORDER — ALBUTEROL SULFATE HFA 108 (90 BASE) MCG/ACT IN AERS: 2.0000 | INHALATION_SPRAY | Freq: Four times a day (QID) | RESPIRATORY_TRACT | Status: DC | PRN

## 2011-07-19 NOTE — Telephone Encounter (Signed)
Refill was called to the [facility]. Angelina Ok, RN 07/19/2011 3:59 PM

## 2011-08-04 ENCOUNTER — Encounter: Payer: Self-pay | Admitting: Internal Medicine

## 2011-08-25 ENCOUNTER — Encounter: Payer: Self-pay | Admitting: Internal Medicine

## 2011-10-11 ENCOUNTER — Other Ambulatory Visit: Payer: Self-pay | Admitting: *Deleted

## 2011-10-11 MED ORDER — ALBUTEROL SULFATE HFA 108 (90 BASE) MCG/ACT IN AERS: 2.0000 | INHALATION_SPRAY | Freq: Four times a day (QID) | RESPIRATORY_TRACT | Status: DC | PRN

## 2011-10-11 NOTE — Telephone Encounter (Signed)
Proventil rx refilled - request form faxed to [facility].

## 2011-10-11 NOTE — Telephone Encounter (Signed)
Refill approved - nurse to complete. 

## 2012-02-29 ENCOUNTER — Encounter: Payer: Self-pay | Admitting: Internal Medicine

## 2012-02-29 ENCOUNTER — Other Ambulatory Visit: Payer: Self-pay | Admitting: *Deleted

## 2012-02-29 DIAGNOSIS — J449 Chronic obstructive pulmonary disease, unspecified: Secondary | ICD-10-CM

## 2012-02-29 MED ORDER — BUDESONIDE-FORMOTEROL FUMARATE 160-4.5 MCG/ACT IN AERO: INHALATION_SPRAY | RESPIRATORY_TRACT | Status: DC

## 2012-03-02 NOTE — Telephone Encounter (Signed)
Called to pharm 

## 2012-03-07 ENCOUNTER — Encounter: Payer: Self-pay | Admitting: Internal Medicine

## 2012-03-28 ENCOUNTER — Emergency Department (HOSPITAL_COMMUNITY): Payer: Self-pay

## 2012-03-28 ENCOUNTER — Encounter (HOSPITAL_COMMUNITY): Payer: Self-pay | Admitting: *Deleted

## 2012-03-28 ENCOUNTER — Other Ambulatory Visit: Payer: Self-pay | Admitting: *Deleted

## 2012-03-28 ENCOUNTER — Emergency Department (HOSPITAL_COMMUNITY)
Admission: EM | Admit: 2012-03-28 | Discharge: 2012-03-28 | Disposition: A | Discharge status: Home / Self Care | Payer: Self-pay | Attending: Emergency Medicine | Admitting: Emergency Medicine

## 2012-03-28 DIAGNOSIS — Z886 Allergy status to analgesic agent status: Secondary | ICD-10-CM | POA: Insufficient documentation

## 2012-03-28 DIAGNOSIS — J449 Chronic obstructive pulmonary disease, unspecified: Secondary | ICD-10-CM

## 2012-03-28 DIAGNOSIS — R0602 Shortness of breath: Secondary | ICD-10-CM | POA: Insufficient documentation

## 2012-03-28 DIAGNOSIS — R062 Wheezing: Secondary | ICD-10-CM | POA: Insufficient documentation

## 2012-03-28 DIAGNOSIS — R0609 Other forms of dyspnea: Secondary | ICD-10-CM | POA: Insufficient documentation

## 2012-03-28 DIAGNOSIS — R0989 Other specified symptoms and signs involving the circulatory and respiratory systems: Secondary | ICD-10-CM | POA: Insufficient documentation

## 2012-03-28 DIAGNOSIS — T7840XA Allergy, unspecified, initial encounter: Secondary | ICD-10-CM | POA: Insufficient documentation

## 2012-03-28 DIAGNOSIS — X58XXXA Exposure to other specified factors, initial encounter: Secondary | ICD-10-CM | POA: Insufficient documentation

## 2012-03-28 LAB — CBC WITH DIFFERENTIAL/PLATELET
Eosinophils Relative: 1 % (ref 0–5)
HCT: 45.7 % (ref 39.0–52.0)
Lymphocytes Relative: 19 % (ref 12–46)
Lymphs Abs: 2 10*3/uL (ref 0.7–4.0)
MCV: 91 fL (ref 78.0–100.0)
Monocytes Absolute: 0.9 10*3/uL (ref 0.1–1.0)
Monocytes Relative: 8 % (ref 3–12)
RBC: 5.02 MIL/uL (ref 4.22–5.81)
WBC: 10.8 10*3/uL — ABNORMAL HIGH (ref 4.0–10.5)

## 2012-03-28 LAB — POCT I-STAT 3, VENOUS BLOOD GAS (G3P V)
O2 Saturation: 32 %
TCO2: 30 mmol/L (ref 0–100)
pCO2, Ven: 51.1 mmHg — ABNORMAL HIGH (ref 45.0–50.0)

## 2012-03-28 LAB — BASIC METABOLIC PANEL
CO2: 27 mEq/L (ref 19–32)
Calcium: 9.1 mg/dL (ref 8.4–10.5)
Creatinine, Ser: 1.5 mg/dL — ABNORMAL HIGH (ref 0.50–1.35)
Glucose, Bld: 96 mg/dL (ref 70–99)

## 2012-03-28 MED ORDER — IPRATROPIUM BROMIDE 0.02 % IN SOLN
0.5000 mg | Freq: Once | RESPIRATORY_TRACT | Status: AC
  Administered 2012-03-28: 0.5 mg via RESPIRATORY_TRACT
  Filled 2012-03-28: qty 2.5

## 2012-03-28 MED ORDER — ALBUTEROL SULFATE (5 MG/ML) 0.5% IN NEBU
5.0000 mg | INHALATION_SOLUTION | Freq: Once | RESPIRATORY_TRACT | Status: AC
  Administered 2012-03-28: 5 mg via RESPIRATORY_TRACT
  Filled 2012-03-28: qty 1

## 2012-03-28 MED ORDER — DIPHENHYDRAMINE HCL 50 MG/ML IJ SOLN
50.0000 mg | Freq: Once | INTRAMUSCULAR | Status: AC
  Administered 2012-03-28: 50 mg via INTRAVENOUS

## 2012-03-28 MED ORDER — ALBUTEROL SULFATE (5 MG/ML) 0.5% IN NEBU
2.5000 mg | INHALATION_SOLUTION | Freq: Once | RESPIRATORY_TRACT | Status: DC
  Filled 2012-03-28: qty 1

## 2012-03-28 MED ORDER — FAMOTIDINE IN NACL 20-0.9 MG/50ML-% IV SOLN
20.0000 mg | Freq: Once | INTRAVENOUS | Status: AC
  Administered 2012-03-28: 20 mg via INTRAVENOUS
  Filled 2012-03-28: qty 50

## 2012-03-28 MED ORDER — METHYLPREDNISOLONE SODIUM SUCC 125 MG IJ SOLR
125.0000 mg | Freq: Once | INTRAMUSCULAR | Status: AC
  Administered 2012-03-28: 125 mg via INTRAVENOUS
  Filled 2012-03-28: qty 2

## 2012-03-28 MED ORDER — ALBUTEROL SULFATE (5 MG/ML) 0.5% IN NEBU
5.0000 mg | INHALATION_SOLUTION | Freq: Once | RESPIRATORY_TRACT | Status: AC
  Administered 2012-03-28: 5 mg via RESPIRATORY_TRACT

## 2012-03-28 MED ORDER — TIOTROPIUM BROMIDE MONOHYDRATE 18 MCG IN CAPS: 18.0000 ug | ORAL_CAPSULE | Freq: Every day | RESPIRATORY_TRACT | Status: DC

## 2012-03-28 NOTE — ED Provider Notes (Signed)
Signout from PA [employer] A: This is a 51 year old male with an allergic reaction to aspirin. Patient took the aspirin and not knowing it was and he never filled. Patient reports a shortness of breath and wheezing which resolved after one nebulizer treatment. He denies any lip swelling but he did have 2 episodes of vomiting now resolved.  1710 patient seen and evaluated resting comfortably laying supine in no respiratory distress. Heart sounds are regular rate and rhythm with no murmurs rubs and gallops. Lung sounds are clear to auscultation bilaterally with no wheezes Rales or rhonchi. He is in no respiratory distress and no stridor. There is no swelling in the airway. Patient is tolerating by mouth and has no abdominal tenderness to palpation.  1849 patient continues to show no signs of worsening allergic reaction. He is in no respiratory distress and denies any nausea or abdominal pain. There is no airway or oral edema. Heart has regular rate and rhythm with no murmurs rubs and gallops. There is no retractions. Lungs sounds are clear to auscultation bilaterally he is moving good air in all fields and there are no wheezes. Abdomen is benign with no tenderness to palpation.  Vital signs stable and patient verbalized his understanding to return precautions.   Wynetta Emery, PA-C 03/28/12 1850

## 2012-03-28 NOTE — ED Notes (Signed)
Pt brought back from triage, pt getting undressed and into a gown

## 2012-03-28 NOTE — ED Notes (Signed)
Patient transported to X-ray 

## 2012-03-28 NOTE — ED Notes (Signed)
Decreased wheezing and work of breathing after breathing tx. Pt still coughing. O2 sats 90% HR 90 after tx.

## 2012-03-28 NOTE — ED Notes (Signed)
Pt reports sudden onset of sob approx ago, expiratory wheezing heard, pt with hx of asthma. Pt with cough and nasal drainage.

## 2012-03-28 NOTE — ED Notes (Signed)
Pt given a cup of ice water per [place], [place]

## 2012-03-28 NOTE — Telephone Encounter (Signed)
Last office visit 04/13/11 Pt in ED today for SOB

## 2012-03-28 NOTE — ED Notes (Signed)
MD at bedside. 

## 2012-03-28 NOTE — ED Provider Notes (Signed)
History     CSN: [id number]  Arrival date & time 03/28/12  1244   First MD Initiated Contact with Patient 03/28/12 1332      Chief Complaint  Patient presents with  . Shortness of Breath  . Wheezing    (Consider location/radiation/quality/duration/timing/severity/associated sxs/prior treatment) HPI  The emergency department with complaints of shortness of breath that started 30 minutes prior to arrival. At that he's allergic to aspirin and took a medication that had an aspirin component in it. He states that 20 minutes afterwards he began to feel sick had 2 episodes of vomiting and then began to wheeze very bad. The patient has a history of severe asthma and has been hospitalized for in the past. A thorough history of present illness as the patient is having difficulty breathing and speaking in full sentences at this time. His pulse ox is low 90s respirations are 27 and his pulse is 111. The patient is in moderate distress at this time.  Past Medical History  Diagnosis Date  . COPD (chronic obstructive pulmonary disease)     exas 6/13, potentially due to ASA use.   . Obesity (BMI 30-39.9)   . Substance abuse     cocaine 35 years ago    Past Surgical History  Procedure Date  . Cholecystectomy 07/30/89  . Pelvic fracture surgery     Family History  Problem Relation Age of Onset  . Pancreatic cancer Mother   . Alcohol abuse Father   . Sarcoidosis Brother     History  Substance Use Topics  . Smoking status: Former Smoker -- 1.0 packs/day for 15 years    Types: Cigarettes    Quit date: 10/30/2009  . Smokeless tobacco: Never Used  . Alcohol Use: Not on file     Beer on wkends      Review of Systems  Unable to perform ROS   Allergies  Aspirin  Home Medications   Current Outpatient Rx  Name Route Sig Dispense Refill  . ACETAMINOPHEN 325 MG PO TABS Oral Take 2 tablets (650 mg total) by mouth every 6 (six) hours as needed. 30 tablet 3  . ALBUTEROL SULFATE HFA  108 (90 BASE) MCG/ACT IN AERS Inhalation Inhale 2 puffs into the lungs every 6 (six) hours as needed for wheezing or shortness of breath. 3 Inhaler 1  . BUDESONIDE-FORMOTEROL FUMARATE 160-4.5 MCG/ACT IN AERO  Take 2 puffs first thing in am and then another 2 puffs about 12 hours later. 1 Inhaler 0  . IBUPROFEN 200 MG PO TABS Oral Take 400 mg by mouth every 6 (six) hours as needed. For pain    . TIOTROPIUM BROMIDE MONOHYDRATE 18 MCG IN CAPS Inhalation Place 1 capsule (18 mcg total) into inhaler and inhale daily. 90 capsule 3    BP 138/80  Pulse 90  Temp 98.9 F (37.2 C) (Oral)  Resp 18  SpO2 100%  Physical Exam  Nursing note and vitals reviewed. Constitutional: He appears well-developed and well-nourished. He appears distressed (pt in moderate respiratory distress).  HENT:  Head: Normocephalic and atraumatic.  Eyes: Pupils are equal, round, and reactive to light.  Neck: Normal range of motion. Neck supple.  Cardiovascular: Regular rhythm and normal heart sounds.   Pulmonary/Chest: He is in respiratory distress (moderate distress, no requiring intubation). He has wheezes. He has no rales. He exhibits no tenderness.  Abdominal: Soft.  Neurological: He is alert.  Skin: Skin is warm and dry.    ED Course  Procedures (  including critical care time)  Labs Reviewed  CBC WITH DIFFERENTIAL - Abnormal; Notable for the following:    WBC 10.8 (*)     Neutro Abs 7.8 (*)     All other components within normal limits  BASIC METABOLIC PANEL - Abnormal; Notable for the following:    Creatinine, Ser 1.50 (*)     GFR calc non Af Amer 52 (*)     GFR calc Af Amer 60 (*)     All other components within normal limits  POCT I-STAT 3, BLOOD GAS (G3P V) - Abnormal; Notable for the following:    pH, Ven 7.350 (*)     pCO2, Ven 51.1 (*)     pO2, Ven 21.0 (*)     Bicarbonate 28.2 (*)     All other components within normal limits   Dg Chest 2 View  03/28/2012  *RADIOLOGY REPORT*  Clinical Data:  Shortness of breath.  Wheezing.  CHEST - 2 VIEW  Comparison: 06/13/2011.  Findings: Small areas of post infectious/inflammatory scarring are present at the right lung base. One of these areas is nodular, projected over the seventh posterior rib on the right.  No airspace disease or consolidation. The cardiopericardial silhouette and mediastinal contours appear within normal limits. 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  IMPRESSION: No acute cardiopulmonary disease.  Nodular density in the right perihilar region probably represents an area of post infectious/inflammatory scarring.  Follow-up 32-month chest radiograph recommended to assess for stability.   Original Report Authenticated By: Andreas Newport, M.D.      No diagnosis found.    MDM  On 2 L nasal cannula his oxygen saturation went to 98%. He was started on IV Solu-Medrol, IV Benadryl, IV Pepcid, given albuterol and Atrovent breathing treatments. The patient was reevaluated and he is feeling much better.Marland Kitchen He is now moving air and his wheezing has significantly decreased. I believe the patient should be monitored for the next 2 hours.  At discharge, he should be given Rx for prednisone and benadryl.   Patient hand off to CDU PA to be monitored for the next two hours.         Dorthula Matas, PA 03/28/12 1555

## 2012-03-28 NOTE — ED Provider Notes (Signed)
Medical screening examination/treatment/procedure(s) were performed by non-physician practitioner and as supervising physician I was immediately available for consultation/collaboration.   Gwyneth Sprout, MD 03/28/12 1925

## 2012-03-29 NOTE — ED Provider Notes (Signed)
Medical screening examination/treatment/procedure(s) were conducted as a shared visit with non-physician practitioner(s) and myself.  I personally evaluated the patient during the encounter   Loren Racer, MD 03/29/12 [phone]

## 2012-03-30 NOTE — Telephone Encounter (Signed)
Rx faxed in.

## 2012-04-25 ENCOUNTER — Ambulatory Visit (INDEPENDENT_AMBULATORY_CARE_PROVIDER_SITE_OTHER): Payer: Self-pay | Admitting: Internal Medicine

## 2012-04-25 ENCOUNTER — Encounter: Payer: Self-pay | Admitting: Internal Medicine

## 2012-04-25 VITALS — BP 140/75 | HR 56 | Temp 98.3°F | Ht 72.0 in | Wt 219.6 lb

## 2012-04-25 DIAGNOSIS — J984 Other disorders of lung: Secondary | ICD-10-CM | POA: Insufficient documentation

## 2012-04-25 DIAGNOSIS — M549 Dorsalgia, unspecified: Secondary | ICD-10-CM

## 2012-04-25 DIAGNOSIS — J309 Allergic rhinitis, unspecified: Secondary | ICD-10-CM | POA: Insufficient documentation

## 2012-04-25 DIAGNOSIS — J449 Chronic obstructive pulmonary disease, unspecified: Secondary | ICD-10-CM

## 2012-04-25 DIAGNOSIS — G8929 Other chronic pain: Secondary | ICD-10-CM

## 2012-04-25 DIAGNOSIS — Z0000 Encounter for general adult medical examination without abnormal findings: Secondary | ICD-10-CM

## 2012-04-25 HISTORY — DX: Allergic rhinitis, unspecified: J30.9

## 2012-04-25 MED ORDER — BUDESONIDE-FORMOTEROL FUMARATE 160-4.5 MCG/ACT IN AERO: INHALATION_SPRAY | RESPIRATORY_TRACT | Status: DC

## 2012-04-25 MED ORDER — ACETAMINOPHEN-CODEINE 300-30 MG PO TABS: 1.0000 | ORAL_TABLET | Freq: Four times a day (QID) | ORAL | Status: DC | PRN

## 2012-04-25 MED ORDER — ALBUTEROL SULFATE HFA 108 (90 BASE) MCG/ACT IN AERS: 2.0000 | INHALATION_SPRAY | Freq: Four times a day (QID) | RESPIRATORY_TRACT | Status: DC | PRN

## 2012-04-25 MED ORDER — MONTELUKAST SODIUM 10 MG PO TABS: 10.0000 mg | ORAL_TABLET | Freq: Every day | ORAL | Status: DC

## 2012-04-25 NOTE — Addendum Note (Signed)
Addended by: Dow Adolph on: 04/25/2012 09:05 PM   Modules accepted: Orders

## 2012-04-25 NOTE — Assessment & Plan Note (Signed)
A recent chest x-ray showed Nodular density in the right perihilar region probably represents an area of post infectious/inflammatory scarring. Follow-up 25-month chest radiograph recommended to assess for stability.

## 2012-04-25 NOTE — Assessment & Plan Note (Addendum)
[name] has noted that he developed asthma that comes after using over-the-counter pain medications. He has a known established. Allergy to aspirin. He also has a questionable allergy to grass after one episode of asthma was exacerbated after mowing his lawn. Previous PFTs where normal. He has been encouraged to avoid over-the-counter pain medications as these wounds may contain aspirin or other NSAID stool, which he might be allergic and, thus causing asthmatic attacks. Have prescribed Singulair. I have also refilled his 'and albuterol inhaler. Patient will followup routinely in the clinic. A recent chest x-ray showed Nodular density in the right perihilar region probably represents an area of post infectious/inflammatory scarring. Follow-up 74-month chest radiograph recommended to assess for stability.

## 2012-04-25 NOTE — Progress Notes (Signed)
Patient ID: [name], male   DOB: [birth year], 51 y.o.   MRN: [id number]  Subjective:   Patient ID: [name] male   DOB: 07-15-1961 51 y.o.   MRN: [id number]  HPI: [name] is a 52 y.o. with past medical history significant for NSAIDS/Aspirin associated asthma who presents to the clinic for refills of his inhalers. The patient was recently evaluated in the ED for an asthmatic attack and was discharged after his shortness of breath improved on inhalers. He is complaining of feeling of fullness in his nose, headaches, and sneezing. He denies history of chest pain, cough, fever or chills. He currently uses Symbicort twice daily, and albuterol inhaler as needed. His asthma is generally stable and has only been admitted once in the hospital with asthma exacerbation. Previous pulmonary function tests, were unremarkable. The patient has a questionable allergy to grass.  He's also requesting for pain medication for chronic back pain.    Past Medical History  Diagnosis Date  . COPD (chronic obstructive pulmonary disease)     exas 6/13, potentially due to ASA use.   . Obesity (BMI 30-39.9)   . Substance abuse     cocaine 35 years ago   Current Outpatient Prescriptions  Medication Sig Dispense Refill  . albuterol (PROVENTIL HFA) 108 (90 BASE) MCG/ACT inhaler Inhale 2 puffs into the lungs every 6 (six) hours as needed for wheezing or shortness of breath.  3 Inhaler  1  . budesonide-formoterol (SYMBICORT) 160-4.5 MCG/ACT inhaler Take 2 puffs first thing in am and then another 2 puffs about 12 hours later.  1 Inhaler  0  . DISCONTD: albuterol (PROVENTIL HFA) 108 (90 BASE) MCG/ACT inhaler Inhale 2 puffs into the lungs every 6 (six) hours as needed for wheezing or shortness of breath.  3 Inhaler  1  . DISCONTD: budesonide-formoterol (SYMBICORT) 160-4.5 MCG/ACT inhaler Take 2 puffs first thing in am and then another 2 puffs about 12 hours later.  1 Inhaler  0  . acetaminophen (TYLENOL) 325 MG tablet  Take 2 tablets (650 mg total) by mouth every 6 (six) hours as needed.  30 tablet  3  . Acetaminophen-Codeine 300-30 MG per tablet Take 1-2 tablets by mouth every 6 (six) hours as needed for pain.  20 tablet  o  . ibuprofen (ADVIL,MOTRIN) 200 MG tablet Take 400 mg by mouth every 6 (six) hours as needed. For pain      . montelukast (SINGULAIR) 10 MG tablet Take 1 tablet (10 mg total) by mouth at bedtime.  30 tablet  1  . tiotropium (SPIRIVA) 18 MCG inhalation capsule Place 1 capsule (18 mcg total) into inhaler and inhale daily.  90 capsule  3   Family History  Problem Relation Age of Onset  . Pancreatic cancer Mother   . Alcohol abuse Father   . Sarcoidosis Brother    History   Social History  . Marital Status: Single    Spouse Name: N/A    Number of Children: N/A  . Years of Education: N/A   Occupational History  . Unemployed     worked as a Psychologist, counselling History Main Topics  . Smoking status: Former Smoker -- 1.0 packs/day for 15 years    Types: Cigarettes    Quit date: 10/30/2009  . Smokeless tobacco: Never Used  . Alcohol Use: None     Beer on wkends  . Drug Use: No     former cocaine use 35 years ago  .  Sexually Active: None     unemployed, was a Investment banker, operational   Other Topics Concern  . None   Social History Narrative  . None   Review of Systems: Negative except as noted in HPI Objective:  Physical Exam: Filed Vitals:   04/25/12 1454  BP: 140/75  Pulse: 56  Temp: 98.3 F (36.8 C)  TempSrc: Oral  Height: 6' (1.829 m)  Weight: 219 lb 9.6 oz (99.61 kg)  SpO2: 98%  Physical Exam  Constitutional: He is oriented to person, place, and time. He appears well-developed and well-nourished.  HENT:  Head: Atraumatic.  Right Ear: Hearing, tympanic membrane, external ear and ear canal normal.  Left Ear: Hearing, tympanic membrane, external ear and ear canal normal.  Nose: Nose normal.  Cardiovascular: Normal rate and regular rhythm.  Exam reveals no friction rub.   No murmur  heard. Pulmonary/Chest: Effort normal and breath sounds normal. No respiratory distress. He has no wheezes. He has no rales. He exhibits no tenderness.  Abdominal: Soft. Bowel sounds are normal.  Musculoskeletal: Normal range of motion. He exhibits no tenderness.  Neurological: He is alert and oriented to person, place, and time.    Assessment & Plan:  I have discussed my assessment and plan with Dr. Kem Kays as detailed under each problem.

## 2012-04-25 NOTE — Patient Instructions (Addendum)
Please avoid any medications that contain Aspirin, Motrin or any other over the counter medications with NSAIDS Pleas follow up with a colonoscopy

## 2012-04-25 NOTE — Assessment & Plan Note (Signed)
This patient's description of sinus-like pains with headaches and sneezing are concerning for possibility of allergic rhinitis. He will be treated with Singulair which might also be beneficial for his asthma.

## 2012-04-25 NOTE — Assessment & Plan Note (Signed)
This patient has chronic back pain. History and physical exam does not reveal any red flags for this back pain, like, neurological deficits. Have prescribed a short course of acetaminophen with codeine to use when necessary for a few days.

## 2012-04-26 NOTE — Progress Notes (Signed)
INTERNAL MEDICINE TEACHING ATTENDING ADDENDUM - Jonah Blue, DO: I personally saw and evaluated [name] in this clinic visit in conjunction with the resident, Dr. Zada Girt. I have discussed patient's plan of care with medical resident during this visit. I have confirmed the physical exam findings and have read and agree with the clinic note including the plan.

## 2012-06-05 ENCOUNTER — Telehealth: Payer: Self-pay | Admitting: *Deleted

## 2012-06-05 ENCOUNTER — Encounter: Payer: Self-pay | Admitting: Gastroenterology

## 2012-06-05 NOTE — Telephone Encounter (Signed)
UNABLE TO CONTACT PATIENT BY PHONE, PHONE NUMBER DISCONNECTED. REMINDER LETTER MAILED TO THE PATIENT WITH APPT INFO.  LELA STURDIVANT NT 11-5-013  3:40PM

## 2012-07-19 ENCOUNTER — Encounter: Payer: Self-pay | Admitting: Gastroenterology

## 2012-07-19 NOTE — Addendum Note (Signed)
Addended by: Neomia Dear on: 07/19/2012 06:06 PM   Modules accepted: Orders

## 2012-08-15 ENCOUNTER — Ambulatory Visit (INDEPENDENT_AMBULATORY_CARE_PROVIDER_SITE_OTHER): Payer: Self-pay | Admitting: Internal Medicine

## 2012-08-15 DIAGNOSIS — M549 Dorsalgia, unspecified: Secondary | ICD-10-CM

## 2012-09-05 ENCOUNTER — Other Ambulatory Visit: Payer: Self-pay | Admitting: *Deleted

## 2012-09-05 DIAGNOSIS — J449 Chronic obstructive pulmonary disease, unspecified: Secondary | ICD-10-CM

## 2012-09-06 ENCOUNTER — Other Ambulatory Visit: Payer: Self-pay | Admitting: Internal Medicine

## 2012-09-06 MED ORDER — BUDESONIDE-FORMOTEROL FUMARATE 160-4.5 MCG/ACT IN AERO: INHALATION_SPRAY | RESPIRATORY_TRACT | Status: DC

## 2012-09-06 NOTE — Telephone Encounter (Signed)
Faxed in

## 2012-10-09 ENCOUNTER — Other Ambulatory Visit: Payer: Self-pay | Admitting: *Deleted

## 2012-10-09 DIAGNOSIS — J309 Allergic rhinitis, unspecified: Secondary | ICD-10-CM

## 2012-10-09 DIAGNOSIS — J449 Chronic obstructive pulmonary disease, unspecified: Secondary | ICD-10-CM

## 2012-10-09 MED ORDER — ALBUTEROL SULFATE HFA 108 (90 BASE) MCG/ACT IN AERS: 2.0000 | INHALATION_SPRAY | Freq: Four times a day (QID) | RESPIRATORY_TRACT | Status: DC | PRN

## 2012-10-16 NOTE — Telephone Encounter (Signed)
Rx called in to pharmacy. Stanton Kidney Lilliana Turner RN 10/16/12 2:15PM

## 2012-12-25 NOTE — Progress Notes (Signed)
Patient ID: [name], male   DOB: 12/26/1960, 52 y.o.   MRN: [id number]

## 2013-01-03 ENCOUNTER — Other Ambulatory Visit: Payer: Self-pay | Admitting: *Deleted

## 2013-01-03 DIAGNOSIS — J309 Allergic rhinitis, unspecified: Secondary | ICD-10-CM

## 2013-01-03 DIAGNOSIS — J449 Chronic obstructive pulmonary disease, unspecified: Secondary | ICD-10-CM

## 2013-01-03 MED ORDER — ALBUTEROL SULFATE HFA 108 (90 BASE) MCG/ACT IN AERS: 2.0000 | INHALATION_SPRAY | Freq: Four times a day (QID) | RESPIRATORY_TRACT | Status: DC | PRN

## 2013-01-03 MED ORDER — BUDESONIDE-FORMOTEROL FUMARATE 160-4.5 MCG/ACT IN AERO: INHALATION_SPRAY | RESPIRATORY_TRACT | Status: DC

## 2013-01-04 NOTE — Telephone Encounter (Signed)
Proventil and Symbicort rxs called to [facility].

## 2013-02-22 ENCOUNTER — Other Ambulatory Visit: Payer: Self-pay | Admitting: *Deleted

## 2013-02-22 DIAGNOSIS — J449 Chronic obstructive pulmonary disease, unspecified: Secondary | ICD-10-CM

## 2013-02-22 MED ORDER — TIOTROPIUM BROMIDE MONOHYDRATE 18 MCG IN CAPS: 18.0000 ug | ORAL_CAPSULE | Freq: Every day | RESPIRATORY_TRACT | Status: DC

## 2013-02-22 NOTE — Telephone Encounter (Signed)
Rx called in to pharmacy. 

## 2013-04-03 ENCOUNTER — Encounter: Payer: Self-pay | Admitting: Internal Medicine

## 2013-04-25 ENCOUNTER — Ambulatory Visit: Payer: Self-pay

## 2013-05-06 ENCOUNTER — Encounter: Payer: Self-pay | Admitting: Internal Medicine

## 2013-05-20 ENCOUNTER — Ambulatory Visit (HOSPITAL_COMMUNITY)
Admission: RE | Admit: 2013-05-20 | Discharge: 2013-05-20 | Disposition: A | Discharge status: Home / Self Care | Payer: Self-pay | Source: Ambulatory Visit | Attending: Internal Medicine | Admitting: Internal Medicine

## 2013-05-20 ENCOUNTER — Ambulatory Visit (INDEPENDENT_AMBULATORY_CARE_PROVIDER_SITE_OTHER): Payer: Self-pay | Admitting: Internal Medicine

## 2013-05-20 VITALS — BP 140/90 | HR 80 | Temp 97.1°F | Ht 72.0 in | Wt 239.7 lb

## 2013-05-20 DIAGNOSIS — M898X9 Other specified disorders of bone, unspecified site: Secondary | ICD-10-CM | POA: Insufficient documentation

## 2013-05-20 DIAGNOSIS — J449 Chronic obstructive pulmonary disease, unspecified: Secondary | ICD-10-CM

## 2013-05-20 DIAGNOSIS — J984 Other disorders of lung: Secondary | ICD-10-CM | POA: Insufficient documentation

## 2013-05-20 DIAGNOSIS — G894 Chronic pain syndrome: Secondary | ICD-10-CM | POA: Insufficient documentation

## 2013-05-20 DIAGNOSIS — M25519 Pain in unspecified shoulder: Secondary | ICD-10-CM | POA: Insufficient documentation

## 2013-05-20 DIAGNOSIS — J309 Allergic rhinitis, unspecified: Secondary | ICD-10-CM

## 2013-05-20 DIAGNOSIS — Z23 Encounter for immunization: Secondary | ICD-10-CM

## 2013-05-20 MED ORDER — ALBUTEROL SULFATE HFA 108 (90 BASE) MCG/ACT IN AERS: 2.0000 | INHALATION_SPRAY | Freq: Four times a day (QID) | RESPIRATORY_TRACT | Status: DC | PRN

## 2013-05-20 MED ORDER — LORATADINE 10 MG PO TABS
10.0000 mg | ORAL_TABLET | Freq: Every day | ORAL | Status: DC
Start: 2013-05-20 — End: 2013-12-11

## 2013-05-20 MED ORDER — ACETAMINOPHEN-CODEINE 300-30 MG PO TABS: 1.0000 | ORAL_TABLET | Freq: Four times a day (QID) | ORAL | Status: DC | PRN

## 2013-05-20 MED ORDER — FLUTICASONE PROPIONATE 50 MCG/ACT NA SUSP: 2.0000 | Freq: Every day | NASAL | Status: DC

## 2013-05-20 MED ORDER — BUDESONIDE-FORMOTEROL FUMARATE 160-4.5 MCG/ACT IN AERO: INHALATION_SPRAY | RESPIRATORY_TRACT | Status: DC
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
## 2013-05-20 NOTE — Assessment & Plan Note (Addendum)
Repeat chest x ray today revealed that prior opacity was "not well demonstrated and not enlarged". Possibility of this being indicative of malignancy is low.

## 2013-05-20 NOTE — Patient Instructions (Addendum)
Please take Tylenol#3 once daily as needed for pain  I have ordered a referral to physical therapy  I have ordered X ray of your chest and right shoulder and blood test. I will call you with results.  I have ordered a nebulizer for your  I have ordered Flonase and Claritin to see if this will help with your nasal congestion I have sent you medication to [facility] at [address]

## 2013-05-21 ENCOUNTER — Encounter: Payer: Self-pay | Admitting: Internal Medicine

## 2013-05-21 LAB — BASIC METABOLIC PANEL WITH GFR
BUN: 15 mg/dL (ref 6–23)
Chloride: 104 mEq/L (ref 96–112)
GFR, Est Non African American: 62 mL/min
Potassium: 4.5 mEq/L (ref 3.5–5.3)
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
## 2013-05-21 NOTE — Assessment & Plan Note (Signed)
Assessment: Patient has significant symptoms of allergic rhinitis. Exam dose not actually reveal a lot nasal turbinate inflammation.  Plan: 1. Labs/imaging: none 2. Therapy: trial of Flonase nasal spray and Loratadine

## 2013-05-21 NOTE — Assessment & Plan Note (Signed)
Symptoms are stables.   Plan  Will order nebulizer per pt request  Continue with Symbicort and Spiriva

## 2013-05-22 NOTE — Progress Notes (Signed)
Case discussed with Dr. Kazibwe at the time of the visit.  We reviewed the resident's history and exam and pertinent patient test results.  I agree with the assessment, diagnosis and plan of care documented in the resident's note. 

## 2013-05-27 ENCOUNTER — Other Ambulatory Visit: Payer: Self-pay | Admitting: *Deleted

## 2013-05-27 MED ORDER — MOMETASONE FUROATE 50 MCG/ACT NA SUSP: 4.0000 | Freq: Every day | NASAL | Status: DC

## 2013-05-27 NOTE — Telephone Encounter (Signed)
Nasonex rx called to [facility] pharmacy; pt informed.

## 2013-05-27 NOTE — Telephone Encounter (Signed)
Pt states Flonase too expensive at [facility]; gets his med from [facility] - but they have Nasonex.  Thanks

## 2013-07-03 ENCOUNTER — Encounter: Payer: Self-pay | Admitting: Internal Medicine

## 2013-09-05 ENCOUNTER — Ambulatory Visit: Payer: Self-pay

## 2013-09-13 ENCOUNTER — Ambulatory Visit: Payer: Self-pay

## 2013-10-23 ENCOUNTER — Encounter: Payer: No Typology Code available for payment source | Admitting: Internal Medicine

## 2013-11-05 ENCOUNTER — Other Ambulatory Visit: Payer: Self-pay | Admitting: *Deleted

## 2013-11-05 DIAGNOSIS — J449 Chronic obstructive pulmonary disease, unspecified: Secondary | ICD-10-CM

## 2013-11-05 MED ORDER — ALBUTEROL SULFATE HFA 108 (90 BASE) MCG/ACT IN AERS: 2.0000 | INHALATION_SPRAY | Freq: Four times a day (QID) | RESPIRATORY_TRACT | Status: DC | PRN

## 2013-11-06 NOTE — Telephone Encounter (Signed)
Rx faxed in.

## 2013-12-04 ENCOUNTER — Other Ambulatory Visit: Payer: Self-pay | Admitting: Internal Medicine

## 2013-12-04 DIAGNOSIS — G894 Chronic pain syndrome: Secondary | ICD-10-CM

## 2013-12-11 ENCOUNTER — Ambulatory Visit (HOSPITAL_COMMUNITY)
Admission: RE | Admit: 2013-12-11 | Discharge: 2013-12-11 | Disposition: A | Discharge status: Home / Self Care | Payer: No Typology Code available for payment source | Source: Ambulatory Visit | Attending: Internal Medicine | Admitting: Internal Medicine

## 2013-12-11 ENCOUNTER — Encounter: Payer: Self-pay | Admitting: Internal Medicine

## 2013-12-11 ENCOUNTER — Ambulatory Visit (INDEPENDENT_AMBULATORY_CARE_PROVIDER_SITE_OTHER): Payer: No Typology Code available for payment source | Admitting: Internal Medicine

## 2013-12-11 VITALS — BP 159/94 | HR 71 | Temp 98.7°F | Ht 72.0 in | Wt 246.5 lb

## 2013-12-11 DIAGNOSIS — J449 Chronic obstructive pulmonary disease, unspecified: Secondary | ICD-10-CM

## 2013-12-11 DIAGNOSIS — G894 Chronic pain syndrome: Secondary | ICD-10-CM

## 2013-12-11 DIAGNOSIS — J45909 Unspecified asthma, uncomplicated: Secondary | ICD-10-CM

## 2013-12-11 DIAGNOSIS — I10 Essential (primary) hypertension: Secondary | ICD-10-CM

## 2013-12-11 DIAGNOSIS — E669 Obesity, unspecified: Secondary | ICD-10-CM

## 2013-12-11 DIAGNOSIS — R030 Elevated blood-pressure reading, without diagnosis of hypertension: Secondary | ICD-10-CM | POA: Insufficient documentation

## 2013-12-11 DIAGNOSIS — R9431 Abnormal electrocardiogram [ECG] [EKG]: Secondary | ICD-10-CM | POA: Insufficient documentation

## 2013-12-11 DIAGNOSIS — J309 Allergic rhinitis, unspecified: Secondary | ICD-10-CM

## 2013-12-11 DIAGNOSIS — Z7189 Other specified counseling: Secondary | ICD-10-CM

## 2013-12-11 LAB — LIPID PANEL
CHOL/HDL RATIO: 4.4 ratio
Cholesterol: 209 mg/dL — ABNORMAL HIGH (ref 0–200)
HDL: 47 mg/dL (ref >39)
LDL Cholesterol: 132 mg/dL — ABNORMAL HIGH (ref 0–99)
Triglycerides: 148 mg/dL (ref <150)
VLDL: 30 mg/dL (ref 0–40)

## 2013-12-11 LAB — BASIC METABOLIC PANEL WITH GFR
BUN: 12 mg/dL (ref 6–23)
CO2: 28 mEq/L (ref 19–32)
CREATININE: 1.21 mg/dL (ref 0.50–1.35)
Calcium: 8.9 mg/dL (ref 8.4–10.5)
Chloride: 105 mEq/L (ref 96–112)
GFR, EST NON AFRICAN AMERICAN: 67 mL/min
GFR, Est African American: 78 mL/min
GLUCOSE: 106 mg/dL — AB (ref 70–99)
Potassium: 4.5 mEq/L (ref 3.5–5.3)
Sodium: 140 mEq/L (ref 135–145)

## 2013-12-11 MED ORDER — CETIRIZINE HCL 5 MG/5ML PO SYRP: 5.0000 mg | ORAL_SOLUTION | Freq: Every day | ORAL | Status: DC

## 2013-12-11 MED ORDER — SALINE SPRAY 0.65 % NA SOLN: 2.0000 | NASAL | Status: DC | PRN

## 2013-12-11 MED ORDER — ALBUTEROL SULFATE 0.63 MG/3ML IN NEBU: 1.0000 | INHALATION_SOLUTION | Freq: Four times a day (QID) | RESPIRATORY_TRACT | Status: DC | PRN

## 2013-12-11 MED ORDER — ALBUTEROL SULFATE HFA 108 (90 BASE) MCG/ACT IN AERS: 2.0000 | INHALATION_SPRAY | Freq: Four times a day (QID) | RESPIRATORY_TRACT | Status: DC | PRN

## 2013-12-11 MED ORDER — ACETAMINOPHEN-CODEINE 300-30 MG PO TABS: 1.0000 | ORAL_TABLET | Freq: Four times a day (QID) | ORAL | Status: DC | PRN

## 2013-12-11 MED ORDER — LISINOPRIL 10 MG PO TABS: 10.0000 mg | ORAL_TABLET | Freq: Every day | ORAL | Status: DC

## 2013-12-11 NOTE — Progress Notes (Addendum)
Patient ID: [name], male   DOB: 02/24/1961, 53 y.o.   MRN: [id number]   Subjective:   HPI: [name] is a 53 y.o. with past medical history of COPD, chronic pain syndrome, obesity, and substance abuse comes for a routine clinic followup visit. I last saw the patient 6 months ago. No interim symptoms besides his ongoing, recurrent shortness of breath, with asthma, which is well-controlled with albuterol inhaler and nebulizer. Is also consistent with his Symbicort.  Patient's main concern today, is loss of sense of smell and taste, which he attributes to  "sinus infection , which was treated 2 years ago, but never cleared ". No constitutional symptoms at this time.  He uses the medical assistance program, but he has had significant challenges affording his medications, including Flonase, Singulair, and Spiriva. As a result he has not been taking these medications.  He also reports to me that was found to have elevated blood pressure when he went to donate plasma a few weeks ago. He, therefore, would like to be evaluated for this.  Kindly see the A&P for the status of the pt's chronic medical problems.   ROS: Constitutional: Denies fever, chills, diaphoresis, appetite change and fatigue. ENT: He endorses ongoing reduced sense of smell and taste, which he attributes to sinusitis. No hearing deficits. Respiratory: Denies chest pain. CVS: No chest pain, palpitations and leg swelling.  GI: No abdominal pain, nausea, vomiting, bloody stools GU: No dysuria, frequency, hematuria, or flank pain.  MSK: No myalgias, back pain, joint swelling, arthralgias  Psych: No depression symptoms. No SI or SA.   Past Medical History  Diagnosis Date  . COPD (chronic obstructive pulmonary disease)     exas 6/13, potentially due to ASA use.   . Obesity (BMI 30-39.9)   . Substance abuse     cocaine 35 years ago   Current Outpatient Prescriptions  Medication Sig Dispense Refill  . Acetaminophen-Codeine  300-30 MG per tablet Take 1-2 tablets by mouth every 6 (six) hours as needed for pain.  30 tablet  2  . albuterol (ACCUNEB) 0.63 MG/3ML nebulizer solution Take 3 mLs (0.63 mg total) by nebulization every 6 (six) hours as needed for wheezing or shortness of breath.  75 mL  6  . albuterol (PROVENTIL HFA) 108 (90 BASE) MCG/ACT inhaler Inhale 2 puffs into the lungs every 6 (six) hours as needed for wheezing or shortness of breath.  3 Inhaler  3  . budesonide-formoterol (SYMBICORT) 160-4.5 MCG/ACT inhaler Take 2 puffs first thing in am and then another 2 puffs about 12 hours later.  3 Inhaler  3  . mometasone (NASONEX) 50 MCG/ACT nasal spray Place 4 sprays into the nose daily.  17 g  3  . cetirizine HCl (ZYRTEC) 5 MG/5ML SYRP Take 5 mLs (5 mg total) by mouth daily.  300 mL  3  . lisinopril (PRINIVIL,ZESTRIL) 10 MG tablet Take 1 tablet (10 mg total) by mouth daily.  30 tablet  3  . sodium chloride (OCEAN) 0.65 % SOLN nasal spray Place 2 sprays into both nostrils as needed for congestion.  60 mL  0   No current facility-administered medications for this visit.   Family History  Problem Relation Age of Onset  . Pancreatic cancer Mother   . Alcohol abuse Father   . Sarcoidosis Brother    History   Social History  . Marital Status: Single    Spouse Name: N/A    Number of Children: N/A  .  Years of Education: N/A   Occupational History  . Unemployed     worked as a Psychologist, counsellingcook   Social History Main Topics  . Smoking status: Former Smoker -- 1.00 packs/day for 15 years    Types: Cigarettes    Quit date: 10/30/2009  . Smokeless tobacco: Never Used  . Alcohol Use: None     Comment: Beer on wkends  . Drug Use: No     Comment: former cocaine use 35 years ago  . Sexual Activity: None     Comment: unemployed, was a Investment banker, operationalchef   Other Topics Concern  . None   Social History Narrative  . None    Objective:  Physical Exam: Filed Vitals:   12/11/13 1349  BP: 160/94  Pulse: 72  Temp: 98.7 F (37.1  C)  TempSrc: Oral  Height: 6' (1.829 m)  Weight: 246 lb 8 oz (111.812 kg)  SpO2: 96%   General: Well nourished. No acute distress.  HEENT: Normal oral mucosa. MMM. Bilateral nasal turbinate swelling. External auditory canal normal bilaterally.  Lungs: CTA bilaterally. Heart: RRR; no extra sounds or murmurs. No JVD. No carotid bruit.  Abdomen: Non-distended, normal BS, soft, nontender; no hepatosplenomegaly. No bruit.  Extremities: No pedal edema. No joint swelling or tenderness. Neurologic: Normal EOM,  Alert and oriented x3. No obvious neurologic/cranial nerve deficits.  Assessment & Plan:  I have discussed my assessment and plan  with  my attending in the clinic, Dr. Meredith PelJoines  as detailed under problem based charting.

## 2013-12-11 NOTE — Assessment & Plan Note (Signed)
Cont with  Symbicort, and albuterol. He is on both nebulizing and inhaler. As noted his PFTs in July 2012, were within normal limits. Has been seen previously by Dr. Sandrea HughsMichael Wert of pulm. He is unable to afford his Spiriva. He stopped smoking 2 years ago.  Plan. -Discontinue Spiriva, due to cost. I also believe that medically he doesn't have much need for Spiriva. I think much of his symptoms are due to asthma as opposed to COPD -Refill albuterol inhaler and nebulizer. -Will continue with Symbicort -Discontinued Singulair due to cost. - follow up with pulmonology when necessary

## 2013-12-11 NOTE — Patient Instructions (Signed)
General Instructions: Please take Lisinopril 10 mg daily. This is for your diagnosis of high blood pressure  I will order some labs today  I will refer you to the Ear, Nose and Throat specialist Please come back in two weeks for blood pressure check  Please bring your medicines with you each time you come.   Medicines may be  Eye drops  Herbal   Vitamins  Pills  Seeing these help us take care of you.   Treatment Goals:  Goals (1 Years of Data) as of 12/11/13         As of Today 05/20/13 05/20/13 04/25/12     Blood Pressure    . Blood Pressure < 140/90  160/94 140/90 153/89 140/75      Progress Toward Treatment Goals:  Treatment Goal 12/11/2013  Prevent falls unable to assess    Self Care Goals & Plans:  Self Care Goal 12/11/2013  Manage my medications take my medicines as prescribed; bring my medications to every visit; refill my medications on time; follow the sick day instructions if I am sick  Eat healthy foods eat more vegetables; eat fruit for snacks and desserts; eat baked foods instead of fried foods; eat foods that are low in salt; eat smaller portions  Be physically active find an activity I enjoy    No flowsheet data found.   Care Management & Community Referrals:  No flowsheet data found.

## 2013-12-11 NOTE — Assessment & Plan Note (Addendum)
Patient without cardiopulmonary symptoms. I do not suspect cardiac ischemia. Plan. -Will consider cardiac referral for EST - Continue to followup. I don't think he needs an echocardiogram now - Will continue with aggressive treatment of his high blood pressure. - Order the lipid panel. If LDL is elevated - will start statin

## 2013-12-11 NOTE — Assessment & Plan Note (Signed)
Symptoms are stable. Referred Tylenol #3.

## 2013-12-11 NOTE — Assessment & Plan Note (Addendum)
Reports increased symptoms with loss of smell and taste. Exam does to reveal much. Can not afford some of his medications in  Plan  - will add NS nasal drops - referral to ENT due to loss of taste and smell - encouraged to cont with steroid nasal spray - change antihistamine to cetirizine in place of Loratadine due to cost. counseled patient about side effects.  - will follow her regularly

## 2013-12-11 NOTE — Assessment & Plan Note (Signed)
BP Readings from Last 3 Encounters:  12/11/13 159/94  05/20/13 140/90  04/25/12 140/75    Lab Results  Component Value Date   NA 140 05/20/2013   K 4.5 05/20/2013   CREATININE 1.31 05/20/2013    Assessment: Blood pressure control:   diagnosed today Progress toward BP goal:    not at goal Comments: Newly diagnosed  Plan: Medications:  Will start lisinopril 10 mg daily Educational resources provided:   Self management tools provided:   Other plans:  1. BMP 2. EKG today with nonspecific T-wave changes, mostly in the lateral leads. These have been stable since 2012. Also noted to have old LAD. No other ischemic changes. Patient without cardiac symptoms. 3. Lipid panel. 4. Urinalysis-patient cannot provide sample. This will be done next visit. 5. He will followup in 2 weeks for recheck of BP and BMP.

## 2013-12-13 ENCOUNTER — Telehealth: Payer: Self-pay | Admitting: *Deleted

## 2013-12-13 DIAGNOSIS — I10 Essential (primary) hypertension: Secondary | ICD-10-CM

## 2013-12-13 MED ORDER — QUINAPRIL HCL 10 MG PO TABS: 10.0000 mg | ORAL_TABLET | Freq: Every day | ORAL | Status: DC

## 2013-12-13 NOTE — Telephone Encounter (Signed)
Switched Lisinopril to Quinapril due to cost.

## 2013-12-13 NOTE — Telephone Encounter (Signed)
Thanks Debie,  Please fax script for quinapril

## 2013-12-13 NOTE — Telephone Encounter (Signed)
[facility] [phone] called - pt can not pay $4.00  for Lisinopril. Only has two meds that [facility] can get free for pt - Benicar or Accuretic  If you can use one of these meds needs new Rx. Stanton KidneyDebra Warrick Llera RN 12/13/13 9:30AM

## 2013-12-24 MED ORDER — OLMESARTAN MEDOXOMIL 20 MG PO TABS: 20.0000 mg | ORAL_TABLET | Freq: Every day | ORAL | Status: DC

## 2013-12-24 NOTE — Telephone Encounter (Signed)
Crystal from [facility] called - unable to get quinapril. Only 2 meds we can choose from Benicar or Accuretic only. Stanton Kidney Ladeidra Borys RN 12/24/13 2PM

## 2013-12-24 NOTE — Telephone Encounter (Signed)
Please fax in Benicar.

## 2013-12-24 NOTE — Addendum Note (Signed)
Addended by: Dow Adolph on: 12/24/2013 03:57 PM   Modules accepted: Orders, Medications

## 2013-12-26 ENCOUNTER — Ambulatory Visit: Payer: No Typology Code available for payment source | Attending: Internal Medicine

## 2013-12-26 DIAGNOSIS — M2560 Stiffness of unspecified joint, not elsewhere classified: Secondary | ICD-10-CM | POA: Insufficient documentation

## 2013-12-26 DIAGNOSIS — M545 Low back pain, unspecified: Secondary | ICD-10-CM | POA: Insufficient documentation

## 2013-12-26 DIAGNOSIS — R5381 Other malaise: Secondary | ICD-10-CM | POA: Insufficient documentation

## 2013-12-26 DIAGNOSIS — IMO0001 Reserved for inherently not codable concepts without codable children: Secondary | ICD-10-CM | POA: Insufficient documentation

## 2013-12-26 DIAGNOSIS — M25519 Pain in unspecified shoulder: Secondary | ICD-10-CM | POA: Insufficient documentation

## 2013-12-30 ENCOUNTER — Other Ambulatory Visit (INDEPENDENT_AMBULATORY_CARE_PROVIDER_SITE_OTHER): Payer: No Typology Code available for payment source

## 2013-12-30 DIAGNOSIS — Z1211 Encounter for screening for malignant neoplasm of colon: Secondary | ICD-10-CM

## 2013-12-30 LAB — POC HEMOCCULT BLD/STL (HOME/3-CARD/SCREEN)
Card #2 Fecal Occult Blod, POC: NEGATIVE
FECAL OCCULT BLD: NEGATIVE
FECAL OCCULT BLD: NEGATIVE

## 2013-12-30 NOTE — Addendum Note (Signed)
Addended by: Remus Blake on: 12/30/2013 11:44 AM   Modules accepted: Orders

## 2013-12-30 NOTE — Addendum Note (Signed)
Addended by: Remus Blake on: 12/30/2013 09:32 AM   Modules accepted: Orders

## 2013-12-31 NOTE — Progress Notes (Signed)
Another hemoccult mailed to pt per Dr Dalphine Handing. Message left on home phone ID recording reson repeating test.

## 2014-01-01 ENCOUNTER — Encounter: Payer: Self-pay | Admitting: Internal Medicine

## 2014-01-01 ENCOUNTER — Encounter: Payer: No Typology Code available for payment source | Admitting: Internal Medicine

## 2014-01-06 ENCOUNTER — Ambulatory Visit: Payer: No Typology Code available for payment source | Attending: Internal Medicine | Admitting: Rehabilitation

## 2014-01-06 DIAGNOSIS — M25519 Pain in unspecified shoulder: Secondary | ICD-10-CM | POA: Insufficient documentation

## 2014-01-06 DIAGNOSIS — IMO0001 Reserved for inherently not codable concepts without codable children: Secondary | ICD-10-CM | POA: Insufficient documentation

## 2014-01-06 DIAGNOSIS — M2560 Stiffness of unspecified joint, not elsewhere classified: Secondary | ICD-10-CM | POA: Insufficient documentation

## 2014-01-06 DIAGNOSIS — R5381 Other malaise: Secondary | ICD-10-CM | POA: Insufficient documentation

## 2014-01-06 DIAGNOSIS — M545 Low back pain, unspecified: Secondary | ICD-10-CM | POA: Insufficient documentation

## 2014-01-08 ENCOUNTER — Encounter: Payer: No Typology Code available for payment source | Admitting: Rehabilitation

## 2014-01-20 ENCOUNTER — Ambulatory Visit: Payer: No Typology Code available for payment source | Admitting: Physical Therapy

## 2014-01-21 ENCOUNTER — Telehealth: Payer: Self-pay | Admitting: *Deleted

## 2014-01-21 NOTE — Telephone Encounter (Signed)
[facility]. Written Rx from pharmacy 01/04/13 and last dispensed 08/12/13. Found Rx on same med 12/11/13 from Dr Zada GirtKazibwe stated to phone in. Ask pharmacy to check on Rx - if unable to find  - Rx from 12/11/13 given. Stanton KidneyDebra Cerra Eisenhower RN 01/21/14 11:45AM

## 2014-01-22 ENCOUNTER — Ambulatory Visit (INDEPENDENT_AMBULATORY_CARE_PROVIDER_SITE_OTHER): Payer: No Typology Code available for payment source | Admitting: Dietician

## 2014-01-22 ENCOUNTER — Other Ambulatory Visit: Payer: Self-pay | Admitting: Internal Medicine

## 2014-01-22 ENCOUNTER — Encounter: Payer: Self-pay | Admitting: Internal Medicine

## 2014-01-22 ENCOUNTER — Ambulatory Visit (INDEPENDENT_AMBULATORY_CARE_PROVIDER_SITE_OTHER): Payer: No Typology Code available for payment source | Admitting: Internal Medicine

## 2014-01-22 ENCOUNTER — Encounter: Payer: Self-pay | Admitting: Dietician

## 2014-01-22 VITALS — BP 150/80 | HR 70 | Temp 98.5°F | Ht 72.0 in | Wt 247.4 lb

## 2014-01-22 DIAGNOSIS — E669 Obesity, unspecified: Secondary | ICD-10-CM

## 2014-01-22 DIAGNOSIS — Z1211 Encounter for screening for malignant neoplasm of colon: Secondary | ICD-10-CM

## 2014-01-22 DIAGNOSIS — I10 Essential (primary) hypertension: Secondary | ICD-10-CM

## 2014-01-22 DIAGNOSIS — R739 Hyperglycemia, unspecified: Secondary | ICD-10-CM

## 2014-01-22 DIAGNOSIS — J309 Allergic rhinitis, unspecified: Secondary | ICD-10-CM

## 2014-01-22 DIAGNOSIS — R635 Abnormal weight gain: Secondary | ICD-10-CM | POA: Insufficient documentation

## 2014-01-22 LAB — POC HEMOCCULT BLD/STL (HOME/3-CARD/SCREEN)
Card #2 Fecal Occult Blod, POC: NEGATIVE
Card #3 Fecal Occult Blood, POC: NEGATIVE
Fecal Occult Blood, POC: NEGATIVE

## 2014-01-22 MED ORDER — OLMESARTAN MEDOXOMIL 40 MG PO TABS: 40.0000 mg | ORAL_TABLET | Freq: Every day | ORAL | Status: DC

## 2014-01-22 NOTE — Progress Notes (Signed)
Patient ID: Percell LocusJames A Dupras, male   DOB: 03/23/1961, 53 y.o.   MRN: [id number]   Subjective:   HPI: Mr.Fredderick A Nedra HaiLee is a 53 y.o. with past medical history of COPD, chronic pain syndrome, obesity, and substance abuse comes for a routine clinic followup visit.  During his last office visit on 12/11/2013, I started him on Benicar 20 mg daily. He has been compliant with this medication. I also referred him to ENT for severe allergic rhinitis, and his reported change in his sense of smell and taste. He has not been able to get an appointment with ENT yet partly due to lack of health insurance but he is on the waiting list. He was also unable to get his prescription of normal saline nasal drops as I had recommended. He reports that his symptoms are unchanged and they are affecting his quality of life.   Kindly see the A&P for the status of the pt's chronic medical problems.  ROS: Constitutional: Denies fever, chills, diaphoresis, appetite change and fatigue. ENT: He endorses persistent reduced sense of smell and taste, which he attributes to sinusitis. No hearing deficits. Respiratory: Denies chest pain. CVS: No chest pain, palpitations and leg swelling.  GI: No abdominal pain, nausea, vomiting, bloody stools GU: No dysuria, frequency, hematuria, or flank pain.  MSK: No myalgias, back pain, joint swelling, arthralgias  Psych: No depression symptoms. No SI or SA.   Past Medical History  Diagnosis Date  . COPD (chronic obstructive pulmonary disease)     exas 6/13, potentially due to ASA use.   . Obesity (BMI 30-39.9)   . Substance abuse     cocaine 35 years ago   Current Outpatient Prescriptions  Medication Sig Dispense Refill  . Acetaminophen-Codeine 300-30 MG per tablet Take 1-2 tablets by mouth every 6 (six) hours as needed for pain.  30 tablet  2  . albuterol (ACCUNEB) 0.63 MG/3ML nebulizer solution Take 3 mLs (0.63 mg total) by nebulization every 6 (six) hours as needed for wheezing or  shortness of breath.  75 mL  6  . albuterol (PROVENTIL HFA) 108 (90 BASE) MCG/ACT inhaler Inhale 2 puffs into the lungs every 6 (six) hours as needed for wheezing or shortness of breath.  3 Inhaler  3  . budesonide-formoterol (SYMBICORT) 160-4.5 MCG/ACT inhaler Take 2 puffs first thing in am and then another 2 puffs about 12 hours later.  3 Inhaler  3  . cetirizine HCl (ZYRTEC) 5 MG/5ML SYRP Take 5 mLs (5 mg total) by mouth daily.  300 mL  3  . mometasone (NASONEX) 50 MCG/ACT nasal spray Place 4 sprays into the nose daily.  17 g  3  . olmesartan (BENICAR) 40 MG tablet Take 1 tablet (40 mg total) by mouth daily.  30 tablet  2  . sodium chloride (OCEAN) 0.65 % SOLN nasal spray Place 2 sprays into both nostrils as needed for congestion.  60 mL  0   No current facility-administered medications for this visit.   Family History  Problem Relation Age of Onset  . Pancreatic cancer Mother   . Alcohol abuse Father   . Sarcoidosis Brother    History   Social History  . Marital Status: Single    Spouse Name: N/A    Number of Children: N/A  . Years of Education: N/A   Occupational History  . Unemployed     worked as a Psychologist, counsellingcook   Social History Main Topics  . Smoking status: Former Smoker --  1.00 packs/day for 15 years    Types: Cigarettes    Quit date: 10/30/2009  . Smokeless tobacco: Never Used  . Alcohol Use: None     Comment: Beer on wkends  . Drug Use: No     Comment: former cocaine use 35 years ago  . Sexual Activity: None     Comment: unemployed, was a Investment banker, operationalchef   Other Topics Concern  . None   Social History Narrative  . None    Objective:  Physical Exam: Filed Vitals:   01/22/14 1354 01/22/14 1421  BP: 148/85 150/80  Pulse: 67 70  Temp: 98.5 F (36.9 C)   TempSrc: Oral   Height: 6' (1.829 m)   Weight: 247 lb 6.4 oz (112.22 kg)   SpO2: 99%    General: Well nourished. No acute distress.  HEENT: Normal oral mucosa. MMM. Bilateral nasal turbinate swelling. External auditory  canal normal bilaterally. The nasal sputum does not appear deviated to me.  Lungs: CTA bilaterally. Heart: RRR; no extra sounds or murmurs. No JVD. No carotid bruit.  Abdomen: Non-distended, normal BS, soft, nontender; no hepatosplenomegaly. No bruit.  Extremities: No pedal edema. No joint swelling or tenderness. Neurologic: Normal EOM,  Alert and oriented x3. No obvious neurologic/cranial nerve deficits.  Assessment & Plan:  I have discussed my assessment and plan  with  my attending in the clinic, Dr. Cyndie ChimeGranfortuna  as detailed under problem based charting.

## 2014-01-22 NOTE — Progress Notes (Signed)
Medical Nutrition Therapy:  Appt start time: 1300 end time:  1346.  Assessment:  Primary concerns today: Weight management.  Patient rates importance and self confidence in achieving weight loss both as "10" on the Lichard scale. He says his motivation comes from hoping for improvement in breathing when he sleeps and bends over from less pressure in his abdomen. He also had been 380# at one time and lost to ~ 200# un intentionally, but does not want to go back to how he felt at a higher weight. He cooks and chops for his own food. Does not like many fruits or vegetables. Intends to see ENT doctor to help him with sleeping.  Usual eating pattern includes 3 meals and 1 snacks per day. Frequent foods include bananas, peaches, cantelope, grapefruit, cheese, 5 16 oz regular koolaid and 1 16 oz pepsi per day.  Avoided foods include milk, yogurt.   Likes pintos, fried okra and squash, greens with hamhock, willing to try soymilk3 24-hr recall: (Up at  5 AM) has trouble sleeping B ( AM)-  Water, 3 eggs, 2 toast with margarine, ~ 3 cups grits, 4 [place]key sausage patties  L ( 1130-12PM)- 2 glasses 16 oz regular koolaid, 2 5 oz hamburgers on rolls iweth may, lettuce, onioin and tomoato  Starts watching TV ~ 5 PM  D (6-7 PM)- 3  skinless chicken thighs cooked on the grill, sometimes eats tuna or salmon on grill 1 cup baked beans, 2 slices bread and 2 glasses koolaid or pepsi Snk ( PM)- honey buns or oatmeal cookies( a whole pack at times) starts craving food especially sweets from 1230 am until 2-3 am  Usual physical activity includes walking 7 dogs one each around his block which takes him about 25 minutes.  Estimated intake: ~ 3500 caories/day Est needs: 3100-3200 to maintain, 2400-2500 for weight loss  Progress Towards Goal(s):  In progress.   Nutritional Diagnosis:  NI-1.5 Excessive energy intake As related to difficulty sleeping and food choices.  As evidenced by her report and weight gain.     Intervention:  Nutrition education and coahcing to assist with gosl setting to accompish stated goal of weight loss. Patient goal: Buy fruit instead of sweets and eat fruit instead of sweets for snacks Method of self monitoring Understanding demonstrated via: Teachback  Monitoring/Evaluation:  Dietary intake, exercise, and body weight in 2 week(s).

## 2014-01-22 NOTE — Assessment & Plan Note (Signed)
BP Readings from Last 3 Encounters:  01/22/14 150/80  12/11/13 159/94  05/20/13 140/90    Lab Results  Component Value Date   NA 140 12/11/2013   K 4.5 12/11/2013   CREATININE 1.21 12/11/2013    Assessment: Blood pressure control: mildly elevated Progress toward BP goal:  unchanged Comments: compliant with his medication.   Plan: Medications:  Increase Benicar from 20 mg daily to 40 mg daily. Educational resources provided: referral to hypertension education class Self management tools provided:   Other plans: check BMET today  Follow up in a month Consider addition of a thiazide diuretic if still BP is elevated.

## 2014-01-22 NOTE — Patient Instructions (Addendum)
General Instructions:  Please increased dose of Benicar to 40 mg daily (2 pills of your current bottle and 1 pill for the new prescription) Please go to the lab to check you blood.  We will try to get into Nose doctor as soon as possible  Please talk to [name] about weight loss Please come back in 1 month   Treatment Goals:  Goals (1 Years of Data) as of 01/22/14         As of Today As of Today 12/11/13 12/11/13 12/11/13     Blood Pressure    . Blood Pressure < 140/90  150/80 148/85 159/94 167/94 160/94      Progress Toward Treatment Goals:  Treatment Goal 01/22/2014  Blood pressure unchanged  Prevent falls unchanged    Self Care Goals & Plans:  Self Care Goal 01/22/2014  Manage my medications take my medicines as prescribed; bring my medications to every visit; refill my medications on time; follow the sick day instructions if I am sick  Eat healthy foods -  Be physically active -    No flowsheet data found.   Care Management & Community Referrals:  Referral 01/22/2014  Referrals made to community resources weight management      Exercise to Lose Weight Exercise and a healthy diet may help you lose weight. Your doctor may suggest specific exercises. EXERCISE IDEAS AND TIPS  Choose low-cost things you enjoy doing, such as walking, bicycling, or exercising to workout videos.  Take stairs instead of the elevator.  Walk during your lunch break.  Park your car further away from work or school.  Go to a gym or an exercise class.  Start with 5 to 10 minutes of exercise each day. Build up to 30 minutes of exercise 4 to 6 days a week.  Wear shoes with good support and comfortable clothes.  Stretch before and after working out.  Work out until you breathe harder and your heart beats faster.  Drink extra water when you exercise.  Do not do so much that you hurt yourself, feel dizzy, or get very short of breath. Exercises that burn about 150 calories:  Running 1   miles in 15 minutes.  Playing volleyball for 45 to 60 minutes.  Washing and waxing a car for 45 to 60 minutes.  Playing touch football for 45 minutes.  Walking 1  miles in 35 minutes.  Pushing a stroller 1  miles in 30 minutes.  Playing basketball for 30 minutes.  Raking leaves for 30 minutes.  Bicycling 5 miles in 30 minutes.  Walking 2 miles in 30 minutes.  Dancing for 30 minutes.  Shoveling snow for 15 minutes.  Swimming laps for 20 minutes.  Walking up stairs for 15 minutes.  Bicycling 4 miles in 15 minutes.  Gardening for 30 to 45 minutes.  Jumping rope for 15 minutes.  Washing windows or floors for 45 to 60 minutes. Document Released: 08/20/2010 Document Revised: 10/10/2011 Document Reviewed: 08/20/2010 [facility] Patient Information 2015 [employer] [place]. This information is not intended to replace advice given to you by your health care provider. Make sure you discuss any questions you have with your health care provider.

## 2014-01-22 NOTE — Assessment & Plan Note (Signed)
Patient is concerned about his weight gain. Review of his chart reveals that he has gained about 25 pounds over the past 2 years. I have discussed with him lifestyle changes including regular exercise and healthy diet , including the concept of carolie intake and expenditure. I will also refer him to our health educator for further counseling.

## 2014-01-23 ENCOUNTER — Ambulatory Visit: Payer: No Typology Code available for payment source

## 2014-01-23 LAB — BASIC METABOLIC PANEL WITH GFR
BUN: 13 mg/dL (ref 6–23)
CALCIUM: 9.3 mg/dL (ref 8.4–10.5)
CHLORIDE: 102 meq/L (ref 96–112)
CO2: 27 mEq/L (ref 19–32)
Creat: 1.12 mg/dL (ref 0.50–1.35)
GFR, EST NON AFRICAN AMERICAN: 74 mL/min
GFR, Est African American: 86 mL/min
Glucose, Bld: 85 mg/dL (ref 70–99)
POTASSIUM: 4.6 meq/L (ref 3.5–5.3)
SODIUM: 138 meq/L (ref 135–145)

## 2014-01-23 NOTE — Patient Instructions (Signed)
Dear [name],  I am mailing this to you as a way to support your efforts and cheer you on to achieving your goal of weight loss!   Your Goal: During the next 2 weeks I will Buy fruit instead of sweets and eat fruit instead of sweets for snacks I will monitor how this plan for weight loss is working by: weighing myself at next visit  See you in two weeks. The appointment day and time should be printed on top of this note.  Sincerely,  Lupita LeashDonna [phone]

## 2014-01-23 NOTE — Progress Notes (Signed)
Attending physician note: Presenting problems, physical findings, medications, reviewed with resident physician Dr. Richard Kazibwe and I concur with his management. Dewaine Granfortuna, M.D., FACP 

## 2014-01-27 ENCOUNTER — Ambulatory Visit: Payer: No Typology Code available for payment source | Admitting: Physical Therapy

## 2014-01-29 ENCOUNTER — Other Ambulatory Visit: Payer: Self-pay | Admitting: *Deleted

## 2014-01-30 ENCOUNTER — Ambulatory Visit: Payer: No Typology Code available for payment source | Attending: Internal Medicine

## 2014-01-30 ENCOUNTER — Other Ambulatory Visit: Payer: Self-pay | Admitting: Internal Medicine

## 2014-01-30 DIAGNOSIS — M2560 Stiffness of unspecified joint, not elsewhere classified: Secondary | ICD-10-CM | POA: Insufficient documentation

## 2014-01-30 DIAGNOSIS — R5381 Other malaise: Secondary | ICD-10-CM | POA: Insufficient documentation

## 2014-01-30 DIAGNOSIS — M25519 Pain in unspecified shoulder: Secondary | ICD-10-CM | POA: Insufficient documentation

## 2014-01-30 DIAGNOSIS — M545 Low back pain, unspecified: Secondary | ICD-10-CM | POA: Insufficient documentation

## 2014-01-30 DIAGNOSIS — IMO0001 Reserved for inherently not codable concepts without codable children: Secondary | ICD-10-CM | POA: Insufficient documentation

## 2014-01-30 MED ORDER — MOMETASONE FUROATE 50 MCG/ACT NA SUSP: NASAL | Status: DC

## 2014-01-30 NOTE — Telephone Encounter (Signed)
Rx  - once - call in to pharmacy.

## 2014-02-03 ENCOUNTER — Ambulatory Visit: Payer: No Typology Code available for payment source | Admitting: Rehabilitation

## 2014-02-03 ENCOUNTER — Ambulatory Visit (INDEPENDENT_AMBULATORY_CARE_PROVIDER_SITE_OTHER): Payer: No Typology Code available for payment source | Admitting: Dietician

## 2014-02-03 VITALS — Wt 247.2 lb

## 2014-02-03 DIAGNOSIS — E669 Obesity, unspecified: Secondary | ICD-10-CM

## 2014-02-03 NOTE — Patient Instructions (Addendum)
You're thinking about eating more fish...   Riding your bicycle (but first have to take them to get fixed) .   You did great eating fruit instead of sweets. Water for sodas, frying less, baking or broiling more.   Change takes time and effort, so be patient with  yourself!!! .. ...   Keep a record of everything you eat and drink for one day and bring it back with you.

## 2014-02-03 NOTE — Progress Notes (Signed)
Medical Nutrition Therapy:  Appt start time: 1005 end time:  1045.  Assessment:  Primary concerns today: Weight management.  Patient reports good adherence to eating fruit instead of sweets at night. He is drinking more water than sweet drinks and frying less. However, he was surprised he lost weight-0.2#.  24-hr recall not done today. He is thinking about trying to incorporate more exercise, fish in his diet and eating less bread.  Increased exercises has helped him with weight loss in the past.  Est needs: 3100-3200 to maintain, 2400-2500 for weight loss  Progress Towards Goal(s):  In progress.   Nutritional Diagnosis:  NI-1.5 Excessive energy intake As related to difficulty sleeping and food choices imrpoving As evidenced by his report and weight stabilization/loss.    Intervention:  Nutrition education and coaching to assist with gosl setting to accompish stated goal of weight loss. Patient goal: keep one day food record to self monitor food and beverage intake Method of self monitoring- weights every other week Understanding demonstrated via: Teachback  Monitoring/Evaluation:  Dietary intake, exercise, and body weight in 2 week(s).

## 2014-02-03 NOTE — Progress Notes (Deleted)
Your thinking about eating more fish...   Riding your bicycle (but first have to take them to get fixed) .   You did great eating fruit instead of sweets. Water for sodas, frying less, baking or broiling more.   Change takes time and effort, so be patient with  yourself!!! .. ...   Keep a record of everything you eat and drink for one day and bring it back with you.

## 2014-02-04 ENCOUNTER — Encounter: Payer: Self-pay | Admitting: Dietician

## 2014-02-05 ENCOUNTER — Encounter: Payer: No Typology Code available for payment source | Admitting: Physical Therapy

## 2014-02-05 ENCOUNTER — Telehealth: Payer: Self-pay | Admitting: *Deleted

## 2014-02-05 DIAGNOSIS — J449 Chronic obstructive pulmonary disease, unspecified: Secondary | ICD-10-CM

## 2014-02-05 NOTE — Telephone Encounter (Signed)
[facility] states pt is self pay and this would be cheaper for pt. On albuterol nebulizer -  pharmacy would like to switch to albuterol 1.25mg /613ml and have him use  1.475ml. Will need new Rx. Stanton KidneyDebra Trong Gosling RN 02/05/14 3PM

## 2014-02-06 MED ORDER — ALBUTEROL SULFATE 1.25 MG/3ML IN NEBU: 1.0000 | INHALATION_SOLUTION | Freq: Four times a day (QID) | RESPIRATORY_TRACT | Status: DC | PRN

## 2014-02-07 NOTE — Telephone Encounter (Signed)
[facility] for albuterol 1.25mg /273ml - it is still going to cost pt $40.00. Lanora Manislizabeth states if you would write  for albuterol 2.5mg /713ml - this Rx for pt would be $10.00. Needs new Rx [facility]. Stanton KidneyDebra Kimisha Eunice RN 02/07/14 4:10PM

## 2014-02-10 ENCOUNTER — Ambulatory Visit: Payer: No Typology Code available for payment source

## 2014-02-10 MED ORDER — ALBUTEROL SULFATE (2.5 MG/3ML) 0.083% IN NEBU: 2.5000 mg | INHALATION_SOLUTION | Freq: Four times a day (QID) | RESPIRATORY_TRACT | Status: DC | PRN

## 2014-02-10 NOTE — Telephone Encounter (Signed)
I changed via phone call with pharmacy.

## 2014-02-10 NOTE — Addendum Note (Signed)
Addended by: Dow AdolphKAZIBWE, Sandy Haye on: 02/10/2014 02:56 PM   Modules accepted: Orders, Medications

## 2014-02-12 ENCOUNTER — Ambulatory Visit: Payer: No Typology Code available for payment source | Admitting: Physical Therapy

## 2014-02-18 ENCOUNTER — Encounter: Payer: Self-pay | Admitting: Dietician

## 2014-02-18 ENCOUNTER — Ambulatory Visit (INDEPENDENT_AMBULATORY_CARE_PROVIDER_SITE_OTHER): Payer: No Typology Code available for payment source | Admitting: Dietician

## 2014-02-18 VITALS — Wt 247.4 lb

## 2014-02-18 DIAGNOSIS — E669 Obesity, unspecified: Secondary | ICD-10-CM

## 2014-02-18 NOTE — Progress Notes (Signed)
Medical Nutrition Therapy:  Appt start time: 1030 end time:  1105.  Assessment:  Primary concerns today: Weight management.  Patient brought food records. They show healthy intake in moderate portions. Weight stable. When asked why he thinks his weight did not change he admits that there are foods he eats that he doesn't record that he eats when he doesn't;t feel good or is tired or frustrated about not breathing well.   Est needs: 3100-3200 to maintain, 2400-2500 for weight loss Food records suggests intake of ~1800 kcal which I do not believe and patient agreed is probably not accurate reflection of his caloric intake  B ( AM)- 2 eggs & [place] sausage x2/ 12 oz. OJ & toast & jelly/ [place] bacon and egg sandwich on whole wheat/ cheerios and milk   L ( PM)- cantelope/cheese sandwich, hot dog all the way, Grilled chicken sandwich    D ( PM)- 2 c pasta salad/wendy;s grilled chicken wrap, 2 baked potatoes with Tub margarine/salmon with rice/cabbage,pintos,mac and cheese Snk ( PM)- 1/2 cantelope, strawberries, 1 box 1.5 oz raisins or 1 banana, angel food cake Beverages reported are mostly water, 12 oz Oj daily and slightly sweet tea with 2 packets sugars 2-4 meals/week.    Progress Towards Goal(s):  No progress on weight which is his goal- he is making progress on making healthier food choices.   Nutritional Diagnosis:  NI-1.5 Excessive energy intake As related to difficulty sleeping and food choices without progress As evidenced by his report and weight stabilization/no change.    Intervention:  Nutrition education and coaching to assist with gosl setting to accompish stated goal of weight loss. Patient goal: eat half a plate of vegetables  at lunch and breakfast Method of self monitoring- weights every other week Understanding demonstrated via: Teachback  Monitoring/Evaluation:  Dietary intake, exercise, and body weight in 2 week(s).

## 2014-02-18 NOTE — Patient Instructions (Signed)
Make a follow up in 2 weeks  Great job with your food record.   Try to eat half your plate full of vegetables at lunch dinner for the next 2 weeks.

## 2014-02-25 ENCOUNTER — Ambulatory Visit: Payer: No Typology Code available for payment source | Admitting: Rehabilitation

## 2014-02-26 ENCOUNTER — Ambulatory Visit (INDEPENDENT_AMBULATORY_CARE_PROVIDER_SITE_OTHER): Payer: No Typology Code available for payment source | Admitting: Internal Medicine

## 2014-02-26 ENCOUNTER — Encounter: Payer: Self-pay | Admitting: Internal Medicine

## 2014-02-26 VITALS — BP 141/81 | HR 74 | Temp 98.3°F | Ht 72.0 in | Wt 245.6 lb

## 2014-02-26 DIAGNOSIS — G894 Chronic pain syndrome: Secondary | ICD-10-CM

## 2014-02-26 DIAGNOSIS — Z5189 Encounter for other specified aftercare: Secondary | ICD-10-CM

## 2014-02-26 DIAGNOSIS — J449 Chronic obstructive pulmonary disease, unspecified: Secondary | ICD-10-CM

## 2014-02-26 DIAGNOSIS — S3720XA Unspecified injury of bladder, initial encounter: Secondary | ICD-10-CM

## 2014-02-26 DIAGNOSIS — J302 Other seasonal allergic rhinitis: Secondary | ICD-10-CM

## 2014-02-26 DIAGNOSIS — S3730XA Unspecified injury of urethra, initial encounter: Secondary | ICD-10-CM

## 2014-02-26 DIAGNOSIS — R339 Retention of urine, unspecified: Secondary | ICD-10-CM | POA: Insufficient documentation

## 2014-02-26 DIAGNOSIS — I10 Essential (primary) hypertension: Secondary | ICD-10-CM

## 2014-02-26 DIAGNOSIS — J45909 Unspecified asthma, uncomplicated: Secondary | ICD-10-CM

## 2014-02-26 DIAGNOSIS — S3720XD Unspecified injury of bladder, subsequent encounter: Secondary | ICD-10-CM

## 2014-02-26 DIAGNOSIS — J4489 Other specified chronic obstructive pulmonary disease: Secondary | ICD-10-CM

## 2014-02-26 DIAGNOSIS — J4540 Moderate persistent asthma, uncomplicated: Secondary | ICD-10-CM

## 2014-02-26 DIAGNOSIS — J3089 Other allergic rhinitis: Secondary | ICD-10-CM

## 2014-02-26 MED ORDER — OLMESARTAN MEDOXOMIL 40 MG PO TABS: 40.0000 mg | ORAL_TABLET | Freq: Every day | ORAL | Status: DC

## 2014-02-26 MED ORDER — ACETAMINOPHEN-CODEINE 300-30 MG PO TABS: 1.0000 | ORAL_TABLET | Freq: Four times a day (QID) | ORAL | Status: DC | PRN

## 2014-02-26 MED ORDER — ALBUTEROL SULFATE HFA 108 (90 BASE) MCG/ACT IN AERS: 2.0000 | INHALATION_SPRAY | Freq: Four times a day (QID) | RESPIRATORY_TRACT | Status: DC | PRN

## 2014-02-26 NOTE — Assessment & Plan Note (Signed)
He reports significant benefit from physical therapy in combination with Tylenol No. 3. Plan -Continue with outpatient PT. -Refill Tylenol No. 3

## 2014-02-26 NOTE — Assessment & Plan Note (Signed)
Patient currently on optimal treatment for asthma on both short and long acting beta 2 agonists and inhaled corticosteroid. Symptoms seem to worsen during summer months. Contributing factors include his allergic rhinitis with examination showing possible nasal congestion/stuffiness, which restricts nasal breathing.  I believe this is the major contributor to his breathing problems at this time. I do not appreciate excessive wheezing in his lungs. Plan -I suspect that if he is upper airway issues addressed, his breathing will improve significantly. -If symptoms continue or worsen, I will have a low threshold for referral to pulmonology but this may be difficulty with lack of insurance. - I will cont with his current treatment and have ENT see him.

## 2014-02-26 NOTE — Patient Instructions (Addendum)
General Instructions: I will check you urine today  I will make a referral to Dr Brunilda PayorNesi again for your bladder problems  I will continue to assist you to make sure you are evaluated by an ear, nose and throat specialist  Please come back in 1-2 months or sooner if needed   Treatment Goals:  Goals (1 Years of Data) as of 02/26/14         As of Today 01/22/14 01/22/14 12/11/13 12/11/13     Blood Pressure    . Blood Pressure < 140/90  141/81 150/80 148/85 159/94 167/94      Progress Toward Treatment Goals:  Treatment Goal 02/26/2014  Blood pressure at goal  Prevent falls improved    Self Care Goals & Plans:  Self Care Goal 02/26/2014  Manage my medications bring my medications to every visit; take my medicines as prescribed; refill my medications on time  Eat healthy foods -  Be physically active -    No flowsheet data found.   Care Management & Community Referrals:  Referral 01/22/2014  Referrals made to community resources weight management
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
## 2014-02-26 NOTE — Assessment & Plan Note (Signed)
BP Readings from Last 3 Encounters:  02/26/14 141/81  01/22/14 150/80  12/11/13 159/94    Lab Results  Component Value Date   NA 138 01/22/2014   K 4.6 01/22/2014   CREATININE 1.12 01/22/2014    Assessment: Blood pressure control: controlled Progress toward BP goal:  at goal Comments: better with higher dose of Benicar  Plan: Medications:  Continue with the Benicar 40 mg daily. Educational resources provided:   Self management tools provided:   Other plans:  Obtain a BMP for potassium level Followup in 1 to 2 months

## 2014-02-26 NOTE — Assessment & Plan Note (Signed)
Assessment: Most likely etiology of his urinary symptoms include BPH resulting into chronic urinary retention. Possibility of complications of prior bladder injury with surgery cannot be excluded. Urinary tract infection is unlikely given the absence of constitutional symptoms and also his chronicity of symptoms.   Plan: 1. Labs/imaging: Bladder scan, urinalysis and PSA (per patient request) 2. Therapy: None that this time 3. Follow up: Referral for urology evaluation by Dr. Brunilda PayorNesi

## 2014-02-27 ENCOUNTER — Ambulatory Visit: Payer: No Typology Code available for payment source | Admitting: Rehabilitation

## 2014-02-27 LAB — BASIC METABOLIC PANEL WITH GFR
BUN: 13 mg/dL (ref 6–23)
CO2: 26 mEq/L (ref 19–32)
CREATININE: 1.27 mg/dL (ref 0.50–1.35)
Calcium: 9.1 mg/dL (ref 8.4–10.5)
Chloride: 105 mEq/L (ref 96–112)
GFR, EST AFRICAN AMERICAN: 74 mL/min
GFR, EST NON AFRICAN AMERICAN: 64 mL/min
GLUCOSE: 81 mg/dL (ref 70–99)
Potassium: 4.3 mEq/L (ref 3.5–5.3)
Sodium: 141 mEq/L (ref 135–145)

## 2014-02-27 LAB — URINALYSIS, ROUTINE W REFLEX MICROSCOPIC
BILIRUBIN URINE: NEGATIVE
Glucose, UA: NEGATIVE mg/dL
HGB URINE DIPSTICK: NEGATIVE
Ketones, ur: NEGATIVE mg/dL
Leukocytes, UA: NEGATIVE
NITRITE: NEGATIVE
Protein, ur: NEGATIVE mg/dL
SPECIFIC GRAVITY, URINE: 1.025 (ref 1.005–1.030)
UROBILINOGEN UA: 0.2 mg/dL (ref 0.0–1.0)
pH: 6 (ref 5.0–8.0)

## 2014-02-27 LAB — PSA: PSA: 4.1 ng/mL — ABNORMAL HIGH (ref <=4.00)

## 2014-02-28 NOTE — Progress Notes (Signed)
Case discussed with Dr.Kazibwe at the time of the visit.  We reviewed the resident's history and exam and pertinent patient test results.  I agree with the assessment, diagnosis, and plan of care documented in the resident's note.    

## 2014-03-04 ENCOUNTER — Encounter: Payer: No Typology Code available for payment source | Admitting: Dietician

## 2014-03-06 ENCOUNTER — Ambulatory Visit: Payer: No Typology Code available for payment source

## 2014-03-11 ENCOUNTER — Ambulatory Visit: Payer: No Typology Code available for payment source

## 2014-03-19 ENCOUNTER — Encounter: Payer: Self-pay | Admitting: Internal Medicine

## 2014-03-24 ENCOUNTER — Ambulatory Visit: Payer: No Typology Code available for payment source

## 2014-03-26 ENCOUNTER — Ambulatory Visit: Payer: No Typology Code available for payment source

## 2014-04-04 ENCOUNTER — Other Ambulatory Visit: Payer: Self-pay | Admitting: *Deleted

## 2014-04-04 DIAGNOSIS — J449 Chronic obstructive pulmonary disease, unspecified: Secondary | ICD-10-CM

## 2014-04-04 MED ORDER — BUDESONIDE-FORMOTEROL FUMARATE 160-4.5 MCG/ACT IN AERO: INHALATION_SPRAY | RESPIRATORY_TRACT | Status: DC

## 2014-04-04 NOTE — Telephone Encounter (Signed)
Rx called in to pharmacy. 

## 2014-04-04 NOTE — Telephone Encounter (Signed)
Also pt is requesting Rx Spiriva - date written 02/22/13 and last dispensed 10/23/13. Also through [facility]. Stanton Kidney Taitum Alms RN 04/04/14 2:25PM

## 2014-05-08 ENCOUNTER — Encounter: Payer: Self-pay | Admitting: Internal Medicine

## 2014-05-09 ENCOUNTER — Other Ambulatory Visit: Payer: Self-pay | Admitting: *Deleted

## 2014-05-09 MED ORDER — MOMETASONE FUROATE 50 MCG/ACT NA SUSP: NASAL | Status: DC

## 2014-05-21 ENCOUNTER — Ambulatory Visit (INDEPENDENT_AMBULATORY_CARE_PROVIDER_SITE_OTHER): Payer: Self-pay | Admitting: Internal Medicine

## 2014-05-21 ENCOUNTER — Encounter: Payer: Self-pay | Admitting: Internal Medicine

## 2014-05-21 VITALS — BP 136/75 | HR 67 | Temp 97.8°F | Ht 72.0 in | Wt 252.7 lb

## 2014-05-21 DIAGNOSIS — Z720 Tobacco use: Secondary | ICD-10-CM

## 2014-05-21 DIAGNOSIS — Z87891 Personal history of nicotine dependence: Secondary | ICD-10-CM | POA: Insufficient documentation

## 2014-05-21 DIAGNOSIS — R339 Retention of urine, unspecified: Secondary | ICD-10-CM

## 2014-05-21 DIAGNOSIS — Z23 Encounter for immunization: Secondary | ICD-10-CM

## 2014-05-21 DIAGNOSIS — I10 Essential (primary) hypertension: Secondary | ICD-10-CM

## 2014-05-21 DIAGNOSIS — J4540 Moderate persistent asthma, uncomplicated: Secondary | ICD-10-CM

## 2014-05-21 DIAGNOSIS — J302 Other seasonal allergic rhinitis: Secondary | ICD-10-CM

## 2014-05-21 DIAGNOSIS — G894 Chronic pain syndrome: Secondary | ICD-10-CM

## 2014-05-21 MED ORDER — ACETAMINOPHEN-CODEINE 300-30 MG PO TABS: 1.0000 | ORAL_TABLET | Freq: Four times a day (QID) | ORAL | Status: DC | PRN

## 2014-05-21 NOTE — Assessment & Plan Note (Addendum)
Stable on symbicort and albuterol. Will continue. Gave a flu shot

## 2014-05-21 NOTE — Assessment & Plan Note (Signed)
Symptoms are stable but bothersome sometimes. We have not been able to get him an appt with ENT due to lack of insurance. He as orange card and is in the waiting list.

## 2014-05-21 NOTE — Assessment & Plan Note (Signed)
Pain is well controled. Will continue with tylenol number 3.

## 2014-05-21 NOTE — Assessment & Plan Note (Signed)
BP Readings from Last 3 Encounters:  05/21/14 136/75  02/26/14 141/81  01/22/14 150/80    Lab Results  Component Value Date   NA 141 02/26/2014   K 4.3 02/26/2014   CREATININE 1.27 02/26/2014    Assessment: Blood pressure control: controlled Progress toward BP goal:  at goal Comments: on benicar 40 mg daily  Plan: Medications:  continue current medications Educational resources provided: brochure Self management tools provided:   Other plans: follow up in 6 months

## 2014-05-21 NOTE — Patient Instructions (Signed)
General Instructions: Please cont with your medications as before  Please come back to see me in 6 months Please call if you need refills for your medications before that.   Please bring your medicines with you each time you come to clinic.  Medicines may include prescription medications, over-the-counter medications, herbal remedies, eye drops, vitamins, or other pills.   Progress Toward Treatment Goals:  Treatment Goal 05/21/2014  Blood pressure at goal  Prevent falls -    Self Care Goals & Plans:  Self Care Goal 05/21/2014  Manage my medications take my medicines as prescribed; bring my medications to every visit  Eat healthy foods drink diet soda or water instead of juice or soda; eat more vegetables; eat foods that are low in salt; eat baked foods instead of fried foods; eat fruit for snacks and desserts  Be physically active -    No flowsheet data found.   Care Management & Community Referrals:  Referral 05/21/2014  Referrals made for care management support none needed  Referrals made to community resources none

## 2014-05-21 NOTE — Assessment & Plan Note (Signed)
He will need to get into a urologist office when possible with insurance coverage. For now symptoms have been stable.

## 2014-05-21 NOTE — Progress Notes (Signed)
Patient ID: [name], male   DOB: 05/04/1961, 53 y.o.   MRN: [id number]   Subjective:   HPI: [name] is a 53 y.o. man with past medical history of COPD, chronic pain syndrome, obesity, and substance abuse comes for a routine clinic followup visit.    He has no new complaints today. He is compliant with his medications.   Last OV was 02/27/2015. He is been feeling well lately.   ROS: Constitutional: Denies fever, chills, diaphoresis, appetite change and fatigue.  ENT: He endorses persistent reduced sense of smell and taste, which he attributes to sinusitis. No hearing deficits Respiratory: on and off cough associated with asthma. Denies SOB, DOE, chest tightness. Reports some wheezing which is chronic for him. Denies chest pain. CVS: No chest pain, palpitations and leg swelling.  GI: No abdominal pain, nausea, vomiting, bloody stools GU: No dysuria, frequency, hematuria, or flank pain.  MSK:  Chronic right shoulder pain. No new aggravation. No myalgias, back pain, joint swelling, arthralgias  Psych: No depression symptoms. No SI or SA.    Objective:  Physical Exam: Filed Vitals:   05/21/14 1321  BP: 136/75  Pulse: 67  Temp: 97.8 F (36.6 C)  TempSrc: Oral  Height: 6' (1.829 m)  Weight: 252 lb 11.2 oz (114.624 kg)  SpO2: 96%   General: Well nourished. No acute distress.  HEENT: Normal oral mucosa. MMM.  Lungs: CTA bilaterally. Heart: RRR; no extra sounds or murmurs  Abdomen: Non-distended, normal bowel sounds, soft, nontender; no hepatosplenomegaly  Extremities: No pedal edema. No joint swelling or tenderness. Neurologic: Normal EOM,  Alert and oriented x3. No obvious neurologic/cranial nerve deficits.  Assessment & Plan:  Discussed case with my attending in the clinic, Dr. Josem KaufmannKlima. See problem based charting.

## 2014-05-26 NOTE — Progress Notes (Signed)
Case discussed with Dr. Kazibwe soon after the resident saw the patient.  We reviewed the resident's history and exam and pertinent patient test results.  I agree with the assessment, diagnosis, and plan of care documented in the resident's note. 

## 2014-07-18 ENCOUNTER — Encounter: Payer: Self-pay | Admitting: Internal Medicine

## 2014-09-23 ENCOUNTER — Telehealth: Payer: Self-pay | Admitting: Internal Medicine

## 2014-09-23 NOTE — Telephone Encounter (Signed)
Call to patient to confirm appointment for 09/24/14 at 3:00 lmtcb

## 2014-09-24 ENCOUNTER — Ambulatory Visit: Payer: Self-pay

## 2014-11-19 ENCOUNTER — Ambulatory Visit (INDEPENDENT_AMBULATORY_CARE_PROVIDER_SITE_OTHER): Payer: No Typology Code available for payment source | Admitting: Internal Medicine

## 2014-11-19 ENCOUNTER — Encounter: Payer: Self-pay | Admitting: Internal Medicine

## 2014-11-19 VITALS — BP 152/91 | HR 60 | Temp 97.8°F | Ht 71.0 in | Wt 245.6 lb

## 2014-11-19 DIAGNOSIS — J4489 Other specified chronic obstructive pulmonary disease: Secondary | ICD-10-CM | POA: Insufficient documentation

## 2014-11-19 DIAGNOSIS — J302 Other seasonal allergic rhinitis: Secondary | ICD-10-CM

## 2014-11-19 DIAGNOSIS — J4540 Moderate persistent asthma, uncomplicated: Secondary | ICD-10-CM

## 2014-11-19 DIAGNOSIS — I10 Essential (primary) hypertension: Secondary | ICD-10-CM

## 2014-11-19 DIAGNOSIS — E669 Obesity, unspecified: Secondary | ICD-10-CM

## 2014-11-19 DIAGNOSIS — Z6834 Body mass index (BMI) 34.0-34.9, adult: Secondary | ICD-10-CM

## 2014-11-19 DIAGNOSIS — G894 Chronic pain syndrome: Secondary | ICD-10-CM

## 2014-11-19 DIAGNOSIS — Z7951 Long term (current) use of inhaled steroids: Secondary | ICD-10-CM

## 2014-11-19 DIAGNOSIS — J45909 Unspecified asthma, uncomplicated: Secondary | ICD-10-CM

## 2014-11-19 DIAGNOSIS — E7800 Pure hypercholesterolemia, unspecified: Secondary | ICD-10-CM

## 2014-11-19 DIAGNOSIS — J309 Allergic rhinitis, unspecified: Secondary | ICD-10-CM

## 2014-11-19 DIAGNOSIS — J449 Chronic obstructive pulmonary disease, unspecified: Secondary | ICD-10-CM

## 2014-11-19 LAB — LIPID PANEL
Cholesterol: 151 mg/dL (ref 0–200)
HDL: 41 mg/dL (ref >=40)
LDL CALC: 95 mg/dL (ref 0–99)
Total CHOL/HDL Ratio: 3.7 Ratio
Triglycerides: 73 mg/dL (ref <150)
VLDL: 15 mg/dL (ref 0–40)

## 2014-11-19 LAB — HEPATIC FUNCTION PANEL
ALT: 13 U/L (ref 0–53)
AST: 13 U/L (ref 0–37)
Albumin: 4.2 g/dL (ref 3.5–5.2)
Alkaline Phosphatase: 59 U/L (ref 39–117)
BILIRUBIN DIRECT: 0.1 mg/dL (ref 0.0–0.3)
Indirect Bilirubin: 0.4 mg/dL (ref 0.2–1.2)
Total Bilirubin: 0.5 mg/dL (ref 0.2–1.2)
Total Protein: 7.4 g/dL (ref 6.0–8.3)

## 2014-11-19 MED ORDER — AMLODIPINE BESYLATE 5 MG PO TABS: 5.0000 mg | ORAL_TABLET | Freq: Every day | ORAL | Status: DC

## 2014-11-19 MED ORDER — ACETAMINOPHEN-CODEINE 300-30 MG PO TABS: 1.0000 | ORAL_TABLET | Freq: Four times a day (QID) | ORAL | Status: DC | PRN

## 2014-11-19 MED ORDER — ALBUTEROL SULFATE HFA 108 (90 BASE) MCG/ACT IN AERS: 2.0000 | INHALATION_SPRAY | Freq: Four times a day (QID) | RESPIRATORY_TRACT | Status: DC | PRN

## 2014-11-19 MED ORDER — PRAVASTATIN SODIUM 20 MG PO TABS
20.0000 mg | ORAL_TABLET | Freq: Every day | ORAL | Status: DC
Start: 2014-11-19 — End: 2014-12-31

## 2014-11-19 NOTE — Assessment & Plan Note (Signed)
Discussed weight loss and therapeutic lifestyle changes in the setting of his hypertension and hyperlipidemia.

## 2014-11-19 NOTE — Assessment & Plan Note (Addendum)
Lipid Panel     Component Value Date/Time   CHOL 209* 12/11/2013 1435   TRIG 148 12/11/2013 1435   HDL 47 12/11/2013 1435   CHOLHDL 4.4 12/11/2013 1435   VLDL 30 12/11/2013 1435   LDLCALC 132* 12/11/2013 1435   His 10 year risk for cardiovascular disease is 15%. Recommendation is moderate intensity statin therapy. Plan -I will start him on Pravastatin 20 mg daily, which is the only statin available [facility]. I will plan to increase the dose to 40 mg daily. -Obtain a recheck lipid panel. -Obtain baseline liver panel. -Counseled the patient on lifestyle changes for hyperlipidemia

## 2014-11-19 NOTE — Assessment & Plan Note (Signed)
[name] has had difficulties accessing ENT services for his severe allergic rhinitis. He has an orange card and he is on a waiting list for the referrals. There are no ENT spots open yet. We'll continue to try to find him an ENT specialist in town to evaluate his allergic rhinitis. He does have significant symptoms with nasal congestion and his breathing is loud through the nose. In the meantime, will continue with Flonase nasal spray.

## 2014-11-19 NOTE — Patient Instructions (Addendum)
General Instructions: Please start taking Pravastastin 20 mg daily for your high cholesterol   Please start taking Amlodipine 5 mg daily to better control your blood pressure.  We will continue to see if we can get you an ENT doctor appointment. Will check your cholesterol today  Please come back in 1 month so we can recheck your blood pressure  Please bring your medicines with you each time you come to clinic.  Medicines may include prescription medications, over-the-counter medications, herbal remedies, eye drops, vitamins, or other pills.   Progress Toward Treatment Goals:  Treatment Goal 11/19/2014  Blood pressure deteriorated  Prevent falls -    Self Care Goals & Plans:  Self Care Goal 11/19/2014  Manage my medications take my medicines as prescribed; bring my medications to every visit; refill my medications on time; follow the sick day instructions if I am sick  Monitor my health keep track of my weight  Eat healthy foods eat more vegetables; eat fruit for snacks and desserts; eat baked foods instead of fried foods; eat foods that are low in salt; eat smaller portions  Be physically active find an activity I enjoy    No flowsheet data found.   Care Management & Community Referrals:  Referral 11/19/2014  Referrals made for care management support none needed  Referrals made to community resources -      Amlodipine tablets What is this medicine? AMLODIPINE (am LOE di peen) is a calcium-channel blocker. It affects the amount of calcium found in your heart and muscle cells. This relaxes your blood vessels, which can reduce the amount of work the heart has to do. This medicine is used to lower high blood pressure. It is also used to prevent chest pain. This medicine may be used for other purposes; ask your health care provider or pharmacist if you have questions. COMMON BRAND NAME(S): Norvasc What should I tell my health care provider before I take this medicine? They need  to know if you have any of these conditions: -heart problems like heart failure or aortic stenosis -liver disease -an unusual or allergic reaction to amlodipine, other medicines, foods, dyes, or preservatives -pregnant or trying to get pregnant -breast-feeding How should I use this medicine? Take this medicine by mouth with a glass of water. Follow the directions on the prescription label. Take your medicine at regular intervals. Do not take more medicine than directed. Talk to your pediatrician regarding the use of this medicine in children. Special care may be needed. This medicine has been used in children as young as 6. Persons over 41 years old may have a stronger reaction to this medicine and need smaller doses. Overdosage: If you think you have taken too much of this medicine contact a poison control center or emergency room at once. NOTE: This medicine is only for you. Do not share this medicine with others. What if I miss a dose? If you miss a dose, take it as soon as you can. If it is almost time for your next dose, take only that dose. Do not take double or extra doses. What may interact with this medicine? -herbal or dietary supplements -local or general anesthetics -medicines for high blood pressure -medicines for prostate problems -rifampin This list may not describe all possible interactions. Give your health care provider a list of all the medicines, herbs, non-prescription drugs, or dietary supplements you use. Also tell them if you smoke, drink alcohol, or use illegal drugs. Some items may interact with  your medicine. What should I watch for while using this medicine? Visit your doctor or health care professional for regular check ups. Check your blood pressure and pulse rate regularly. Ask your health care professional what your blood pressure and pulse rate should be, and when you should contact him or her. This medicine may make you feel confused, dizzy or lightheaded. Do  not drive, use machinery, or do anything that needs mental alertness until you know how this medicine affects you. To reduce the risk of dizzy or fainting spells, do not sit or stand up quickly, especially if you are an older patient. Avoid alcoholic drinks; they can make you more dizzy. Do not suddenly stop taking amlodipine. Ask your doctor or health care professional how you can gradually reduce the dose. What side effects may I notice from receiving this medicine? Side effects that you should report to your doctor or health care professional as soon as possible: -allergic reactions like skin rash, itching or hives, swelling of the face, lips, or tongue -breathing problems -changes in vision or hearing -chest pain -fast, irregular heartbeat -swelling of legs or ankles Side effects that usually do not require medical attention (report to your doctor or health care professional if they continue or are bothersome): -dry mouth -facial flushing -nausea, vomiting -stomach gas, pain -tired, weak -trouble sleeping This list may not describe all possible side effects. Call your doctor for medical advice about side effects. You may report side effects to FDA at 1-800-FDA-1088. Where should I keep my medicine? Keep out of the reach of children. Store at room temperature between 59 and 86 degrees F (15 and 30 degrees C). Protect from light. Keep container tightly closed. Throw away any unused medicine after the expiration date. NOTE: This sheet is a summary. It may not cover all possible information. If you have questions about this medicine, talk to your doctor, pharmacist, or health care provider.  2015, Elsevier/Gold Standard. (2012-06-15 11:40:58) Pravastatin tablets What is this medicine? PRAVASTATIN (PRA va stat in) is known as a HMG-CoA reductase inhibitor or 'statin'. It lowers the level of cholesterol and triglycerides in the blood. This drug may also reduce the risk of heart attack,  stroke, or other health problems in patients with risk factors for heart disease. Diet and lifestyle changes are often used with this drug. This medicine may be used for other purposes; ask your health care provider or pharmacist if you have questions. COMMON BRAND NAME(S): Pravachol What should I tell my health care provider before I take this medicine? They need to know if you have any of these conditions: -frequently drink alcoholic beverages -kidney disease -liver disease -muscle aches or weakness -other medical condition -an unusual or allergic reaction to pravastatin, other medicines, foods, dyes, or preservatives -pregnant or trying to get pregnant -breast-feeding How should I use this medicine? Take pravastatin tablets by mouth. Swallow the tablets with a drink of water. Pravastatin can be taken at anytime of the day, with or without food. Follow the directions on the prescription label. Take your doses at regular intervals. Do not take your medicine more often than directed. Talk to your pediatrician regarding the use of this medicine in children. Special care may be needed. Pravastatin has been used in children as young as 79 years of age. Overdosage: If you think you have taken too much of this medicine contact a poison control center or emergency room at once. NOTE: This medicine is only for you. Do not share this  medicine with others. What if I miss a dose? If you miss a dose, take it as soon as you can. If it is almost time for your next dose, take only that dose. Do not take double or extra doses. What may interact with this medicine? Do not take this medicine with any of the following medications: -herbal medicines such as red yeast rice This medicine may also interact with the following medications: -alcohol -antiviral medicines for HIV or AIDS -certain medicines for fungal infections like ketoconazole and itraconazole -colchicine -cyclosporine -other medicines for high  cholesterol -some antibiotics like clarithromycin, erythromycin, and telithromycin This list may not describe all possible interactions. Give your health care provider a list of all the medicines, herbs, non-prescription drugs, or dietary supplements you use. Also tell them if you smoke, drink alcohol, or use illegal drugs. Some items may interact with your medicine. What should I watch for while using this medicine? Visit your doctor or health care professional for regular check-ups. You may need regular tests to make sure your liver is working properly. Tell your doctor or health care professional right away if you get any unexplained muscle pain, tenderness, or weakness, especially if you also have a fever and tiredness. Your doctor or health care professional may tell you to stop taking this medicine if you develop muscle problems. If your muscle problems do not go away after stopping this medicine, contact your health care professional. This drug is only part of a total heart-health program. Your doctor or a dietician can suggest a low-cholesterol and low-fat diet to help. Avoid alcohol and smoking, and keep a proper exercise schedule. Do not use this drug if you are pregnant or breast-feeding. Serious side effects to an unborn child or to an infant are possible. Talk to your doctor or pharmacist for more information. This medicine may affect blood sugar levels. If you have diabetes, check with your doctor or health care professional before you change your diet or the dose of your diabetic medicine. If you are going to have surgery tell your health care professional that you are taking this drug. What side effects may I notice from receiving this medicine? Side effects that you should report to your doctor or health care professional as soon as possible: -allergic reactions like skin rash, itching or hives, swelling of the face, lips, or tongue -dark urine -fever -muscle pain, cramps, or  weakness -redness, blistering, peeling or loosening of the skin, including inside the mouth -trouble passing urine or change in the amount of urine -unusually weak or tired -yellowing of the eyes or skin Side effects that usually do not require medical attention (report to your doctor or health care professional if they continue or are bothersome): -gas -headache -heartburn -indigestion -stomach pain This list may not describe all possible side effects. Call your doctor for medical advice about side effects. You may report side effects to FDA at 1-800-FDA-1088. Where should I keep my medicine? Keep out of the reach of children. Store at room temperature between 15 to 30 degrees C (59 to 86 degrees F). Protect from light. Keep container tightly closed. Throw away any unused medicine after the expiration date. NOTE: This sheet is a summary. It may not cover all possible information. If you have questions about this medicine, talk to your doctor, pharmacist, or health care provider.  2015, Elsevier/Gold Standard. (2011-06-07 10:39:37)  High Cholesterol High cholesterol refers to having a high level of cholesterol in your blood. Cholesterol is a  white, waxy, fat-like protein that your body needs in small amounts. Your liver makes all the cholesterol you need. Excess cholesterol comes from the food you eat. Cholesterol travels in your bloodstream through your blood vessels. If you have high cholesterol, deposits (plaque) may build up on the walls of your blood vessels. This makes the arteries narrower and stiffer. Plaque increases your risk of heart attack and stroke. Work with your health care provider to keep your cholesterol levels in a healthy range. RISK FACTORS Several things can make you more likely to have high cholesterol. These include:   Eating foods high in animal fat (saturated fat) or cholesterol.  Being overweight.  Not getting enough exercise.  Having a family history of  high cholesterol. SIGNS AND SYMPTOMS High cholesterol does not cause symptoms. DIAGNOSIS  Your health care provider can do a blood test to check whether you have high cholesterol. If you are older than 20, your health care provider may check your cholesterol every 4-6 years. You may be checked more often if you already have high cholesterol or other risk factors for heart disease. The blood test for cholesterol measures the following:  Bad cholesterol (LDL cholesterol). This is the type of cholesterol that causes heart disease. This number should be less than 100.  Good cholesterol (HDL cholesterol). This type helps protect against heart disease. A healthy level of HDL cholesterol is 60 or higher.  Total cholesterol. This is the combined number of LDL cholesterol and HDL cholesterol. A healthy number is less than 200. TREATMENT  High cholesterol can be treated with diet changes, lifestyle changes, and medicine.   Diet changes may include eating more whole grains, fruits, vegetables, nuts, and fish. You may also have to cut back on red meat and foods with a lot of added sugar.  Lifestyle changes may include getting at least 40 minutes of aerobic exercise three times a week. Aerobic exercises include walking, biking, and swimming. Aerobic exercise along with a healthy diet can help you maintain a healthy weight. Lifestyle changes may also include quitting smoking.  If diet and lifestyle changes are not enough to lower your cholesterol, your health care provider may prescribe a statin medicine. This medicine has been shown to lower cholesterol and also lower the risk of heart disease. HOME CARE INSTRUCTIONS  Only take over-the-counter or prescription medicines as directed by your health care provider.   Follow a healthy diet as directed by your health care provider. For instance:   Eat chicken (without skin), fish, veal, shellfish, ground Malaw[place]urkey breast, and round or loin cuts of red  meat.  Do not eat fried foods and fatty meats, such as hot dogs and salami.   Eat plenty of fruits, such as apples.   Eat plenty of vegetables, such as broccoli, potatoes, and carrots.   Eat beans, peas, and lentils.   Eat grains, such as barley, rice, couscous, and bulgur wheat.   Eat pasta without cream sauces.   Use skim or nonfat milk and low-fat or nonfat yogurt and cheeses. Do not eat or drink whole milk, cream, ice cream, egg yolks, and hard cheeses.   Do not eat stick margarine or tub margarines that contain trans fats (also called partially hydrogenated oils).   Do not eat cakes, cookies, crackers, or other baked goods that contain trans fats.   Do not eat saturated tropical oils, such as coconut and palm oil.   Exercise as directed by your health care provider. Increase your activity  level with activities such as gardening or walking.   Keep all follow-up appointments.  SEEK MEDICAL CARE IF:  You are struggling to maintain a healthy diet or weight.  You need help starting an exercise program.  You need help to stop smoking. SEEK IMMEDIATE MEDICAL CARE IF:  You have chest pain.  You have trouble breathing. Document Released: 07/18/2005 Document Revised: 12/02/2013 Document Reviewed: 05/10/2013 [facility] Patient Information 2015 [place], [place]. This information is not intended to replace advice given to you by your health care provider. Make sure you discuss any questions you have with your health care provider.

## 2014-11-19 NOTE — Progress Notes (Signed)
Patient ID: [name], male   DOB: 12/21/1960, 54 y.o.   MRN: [id number]   Subjective:   HPI: [name] is a 54 y.o. gentleman with past medical history of severe allergic rhinitis, hypertension and other problems as listed below presents for routine follow-up visit.   No new complaints today. He needs refills on his medications.  Kindly see the A&P for the status of the pt's chronic medical problems.  Past Medical History  Diagnosis Date  . COPD (chronic obstructive pulmonary disease)     exas 6/13, potentially due to ASA use.   . Obesity (BMI 30-39.9)   . Substance abuse     cocaine 35 years ago  . Asthma, chronic 10/21/2010    Followed in Pulmonary clinic/ [facility]/ [facility]   - HFA 50% 01/04/2011  > 90% 02/04/2011   - PFT's wnl 02/04/2011  - no intubation before  - has been hospitalized once for asthma in 2012 - quit smoking in 2012 - allergic to aspirin and pollen   10/11/2014>>Repeat pulmonary function tests. Performed for disability application FEV1/FVC=68.5%, FEV1 48%. Consistent with COPD, severe Please see results under media   . Allergic rhinitis 04/25/2012  . Chronic pain syndrome 02/03/2011    Involving the shoulder joint  05/20/2013: right shoulder x ray >>Degenerative changes AC joint Seen by PT by 11/2013 >> improvement 03/13/2014 D/c from PT due to orange card being expired and pt cancelling appt     ROS: Constitutional: Denies fever, chills, diaphoresis, appetite change and fatigue.  Respiratory: History of asthma. Mild and stable. He also has history of severe allergic rhinitis for which I referred him to ENT but has not been yet evaluated. Is on the waiting list. Denies SOB, DOE, cough, chest tightness, and wheezing. Denies chest pain. CVS: No chest pain, palpitations and leg swelling.  GI: No abdominal pain, nausea, vomiting, bloody stools GU: No dysuria, frequency, hematuria, or flank pain.  MSK: No myalgias, back pain, joint swelling, arthralgias  Psych: No  depression symptoms. No SI or SA.    Objective:  Physical Exam: Filed Vitals:   11/19/14 1325  BP: 152/91  Pulse: 60  Temp: 97.8 F (36.6 C)  TempSrc: Oral  Height: 5\' 11"  (1.803 m)  Weight: 245 lb 9.6 oz (111.403 kg)  SpO2: 97%   General: Well nourished. No acute distress. Noise of breathing with nasal obstruction HEENT: Normal oral mucosa. MMM.  Lungs: CTA bilaterally. No wheezing bilaterally Heart: RRR; no extra sounds or murmurs  Abdomen: Non-distended, normal bowel sounds, soft, nontender; no hepatosplenomegaly  Extremities: No pedal edema. No joint swelling or tenderness. Neurologic: Normal EOM,  Alert and oriented x3. No obvious neurologic/cranial nerve deficits.  Assessment & Plan:  Discussed case with my attending in the clinic, Dr. Heide SparkNarendra. See problem based charting.

## 2014-11-19 NOTE — Assessment & Plan Note (Addendum)
Symptoms tend to worsen during the spring. He is consistent with Symbicort inhaler. He also has albuterol. No wheezing on exam. I will refill his medications.

## 2014-11-19 NOTE — Assessment & Plan Note (Signed)
BP Readings from Last 3 Encounters:  11/19/14 152/91  05/21/14 136/75  02/26/14 141/81    Lab Results  Component Value Date   NA 141 02/26/2014   K 4.3 02/26/2014   CREATININE 1.27 02/26/2014    Assessment: Blood pressure control: moderately elevated Progress toward BP goal:  deteriorated Comments: On Benicar 40 mg daily  Plan: Medications:  Add Norvasc 5 mg daily Educational resources provided: brochure, handout, video Self management tools provided:   Other plans: follow up in a month for recheck BP.

## 2014-11-19 NOTE — Assessment & Plan Note (Signed)
His pain is stable. Requests for refill of Tylenol #3.

## 2014-11-20 NOTE — Progress Notes (Signed)
INTERNAL MEDICINE TEACHING ATTENDING ADDENDUM - Anjela Cassara, MD: I reviewed and discussed at the time of visit with the resident Dr. Kazibwe, the patient's medical history, physical examination, diagnosis and results of pertinent tests and treatment and I agree with the patient's care as documented.  

## 2014-12-01 NOTE — Progress Notes (Signed)
Rx from 11/19/14 Tylenol #3 called in to [facility] - pharmacy has no record Rx from 11/19/14  - pt aware. Stanton KidneyDebra Jerald Villalona RN 12/01/14 1:25PM

## 2014-12-15 ENCOUNTER — Encounter: Payer: Self-pay | Admitting: *Deleted

## 2014-12-30 ENCOUNTER — Telehealth: Payer: Self-pay | Admitting: Internal Medicine

## 2014-12-30 NOTE — Telephone Encounter (Signed)
Call to patient to confirm appointment for 12/31/14 at 2:15 lmtcb ° °

## 2014-12-31 ENCOUNTER — Ambulatory Visit (INDEPENDENT_AMBULATORY_CARE_PROVIDER_SITE_OTHER): Payer: No Typology Code available for payment source | Admitting: Internal Medicine

## 2014-12-31 ENCOUNTER — Encounter: Payer: Self-pay | Admitting: Internal Medicine

## 2014-12-31 VITALS — BP 150/77 | HR 66 | Temp 98.0°F | Ht 71.0 in | Wt 238.7 lb

## 2014-12-31 DIAGNOSIS — J449 Chronic obstructive pulmonary disease, unspecified: Secondary | ICD-10-CM

## 2014-12-31 DIAGNOSIS — G894 Chronic pain syndrome: Secondary | ICD-10-CM

## 2014-12-31 DIAGNOSIS — L84 Corns and callosities: Secondary | ICD-10-CM | POA: Insufficient documentation

## 2014-12-31 DIAGNOSIS — I10 Essential (primary) hypertension: Secondary | ICD-10-CM

## 2014-12-31 DIAGNOSIS — Z79899 Other long term (current) drug therapy: Secondary | ICD-10-CM

## 2014-12-31 DIAGNOSIS — E7800 Pure hypercholesterolemia, unspecified: Secondary | ICD-10-CM

## 2014-12-31 MED ORDER — ACETAMINOPHEN-CODEINE 300-30 MG PO TABS: 1.0000 | ORAL_TABLET | Freq: Four times a day (QID) | ORAL | Status: AC | PRN

## 2014-12-31 MED ORDER — ALBUTEROL SULFATE (2.5 MG/3ML) 0.083% IN NEBU: 2.5000 mg | INHALATION_SOLUTION | Freq: Four times a day (QID) | RESPIRATORY_TRACT | Status: DC | PRN

## 2014-12-31 MED ORDER — AMLODIPINE BESYLATE 5 MG PO TABS: 5.0000 mg | ORAL_TABLET | Freq: Every day | ORAL | Status: DC

## 2014-12-31 MED ORDER — ATORVASTATIN CALCIUM 40 MG PO TABS: 40.0000 mg | ORAL_TABLET | Freq: Every day | ORAL | Status: DC

## 2014-12-31 MED ORDER — ALBUTEROL SULFATE HFA 108 (90 BASE) MCG/ACT IN AERS: 2.0000 | INHALATION_SPRAY | Freq: Four times a day (QID) | RESPIRATORY_TRACT | Status: DC | PRN

## 2014-12-31 MED ORDER — OLMESARTAN MEDOXOMIL 40 MG PO TABS: 40.0000 mg | ORAL_TABLET | Freq: Every day | ORAL | Status: DC

## 2014-12-31 NOTE — Assessment & Plan Note (Signed)
Lipid Panel     Component Value Date/Time   CHOL 151 11/19/2014 1408   TRIG 73 11/19/2014 1408   HDL 41 11/19/2014 1408   CHOLHDL 3.7 11/19/2014 1408   VLDL 15 11/19/2014 1408   LDLCALC 95 11/19/2014 1408   His 10 year risk for cardiovascular disease is 15%. Recommendation is moderate intensity statin therapy. He was not able to get pravastatin which I started last visit.  Plan -switch Pravastatin 20 mg daily to Lip[facility] They have Lipitor per new medication list. Instructed the patient to call if he unable to get it.

## 2014-12-31 NOTE — Assessment & Plan Note (Signed)
BP Readings from Last 3 Encounters:  12/31/14 150/77  11/19/14 152/91  05/21/14 136/75    Lab Results  Component Value Date   NA 141 02/26/2014   K 4.3 02/26/2014   CREATININE 1.27 02/26/2014    Assessment: Blood pressure control: moderately elevated Progress toward BP goal:  deteriorated Comments: On Benicar 40 mg daily. Has not been taking the Amlodipine which i added during his last visit. He states that his pharmacy did not have it.   Plan: Medications:  Printed out all his prescriptions and handed them to him. Benicar 40 mg daily and Amlodipine 5 mg daily [place] [facility] [place] [facility] get them for free.  Educational resources provided: brochure, handout, video Self management tools provided:   Other plans: follow up in a month for recheck BP.

## 2014-12-31 NOTE — Progress Notes (Signed)
Patient ID: Austin LocusJames A Ali, male   DOB: 05/15/1961, 54 y.o.   MRN: [id number]   Subjective:   HPI: [name] is a 54 y.o. gentleman with past medical history below presents for an acute visit for bilateral feet pain.  Reason(s) for visit:  Feet pain: Patient reports one month's history of bilateral feet pain. He notes some callus formation in the salt of his feet bilaterally. The pain tends to be aggravated with him stepping and had surfaces. He has not noticed any fevers, chills,or fatigue. He has never had same problem before.  He has no other complaints.   Kindly see the A&P for the status of the pt's chronic medical problems.  Past Medical History  Diagnosis Date  . COPD (chronic obstructive pulmonary disease)     exas 6/13, potentially due to ASA use.   . Obesity (BMI 30-39.9)   . Substance abuse     cocaine 35 years ago  . Asthma, chronic 10/21/2010    Followed in Pulmonary clinic/ [facility]/ [facility]   - HFA 50% 01/04/2011  > 90% 02/04/2011   - PFT's wnl 02/04/2011  - no intubation before  - has been hospitalized once for asthma in 2012 - quit smoking in 2012 - allergic to aspirin and pollen   10/11/2014>>Repeat pulmonary function tests. Performed for disability application FEV1/FVC=68.5%, FEV1 48%. Consistent with COPD, severe Please see results under media   . Allergic rhinitis 04/25/2012  . Chronic pain syndrome 02/03/2011    Involving the shoulder joint  05/20/2013: right shoulder x ray >>Degenerative changes AC joint Seen by PT by 11/2013 >> improvement 03/13/2014 D/c from PT due to orange card being expired and pt cancelling appt     ROS: Constitutional:  Denies fevers, chills, diaphoresis, appetite change and fatigue.  Respiratory: Denies SOB, DOE, cough, chest tightness, and wheezing.  CVS: No chest pain, palpitations and leg swelling.  GI: No abdominal pain, nausea, vomiting, bloody stools GU: No dysuria, frequency, hematuria, or flank pain.  MSK: No myalgias, back pain,  joint swelling, arthralgias  Psych: No depression symptoms. No SI or SA.    Objective:  Physical Exam: Filed Vitals:   12/31/14 1457  BP: 150/77  Pulse: 66  Temp: 98 F (36.7 C)  TempSrc: Oral  Height: 5\' 11"  (1.803 m)  Weight: 238 lb 11.2 oz (108.274 kg)  SpO2: 97%   General: Well nourished. No acute distress.  HEENT: Normal oral mucosa. MMM.  Lungs: CTA bilaterally. No wheezing. Heart: RRR; no extra sounds or murmurs  Abdomen: Non-distended, normal bowel sounds, soft, nontender; no hepatosplenomegaly  Extremities: No pedal edema. No joint swelling or tenderness. Callus at the plantar surface of both feet. No open wound. Poor feet hygiene with unclipped toe nails.  Neurologic: Normal EOM,  Alert and oriented x3. No obvious neurologic/cranial nerve deficits.  Assessment & Plan:  Discussed case with Dr Rogelia BogaButcher See problem based charting for assessment and plan.

## 2014-12-31 NOTE — Assessment & Plan Note (Addendum)
Pain is stable. Refilled Tylenol#3

## 2014-12-31 NOTE — Patient Instructions (Signed)
General Instructions: I have referred you to a foot doctor  Please start taking LIPITOR 40 mg once daily  Please be sure to take Amlodipine 5 mg once daily  Please come back in 1 month  Please bring your medicines with you each time you come to clinic.  Medicines may include prescription medications, over-the-counter medications, herbal remedies, eye drops, vitamins, or other pills.   Progress Toward Treatment Goals:  Treatment Goal 12/31/2014  Blood pressure deteriorated  Prevent falls -    Self Care Goals & Plans:  Self Care Goal 12/31/2014  Manage my medications take my medicines as prescribed; bring my medications to every visit; refill my medications on time; follow the sick day instructions if I am sick  Monitor my health -  Eat healthy foods eat more vegetables; eat fruit for snacks and desserts; eat foods that are low in salt; eat baked foods instead of fried foods; eat smaller portions  Be physically active find an activity I enjoy    No flowsheet data found.   Care Management & Community Referrals:  Referral 11/19/2014  Referrals made for care management support none needed  Referrals made to community resources -

## 2014-12-31 NOTE — Assessment & Plan Note (Signed)
He has evidence of callus formation in both feet.  Plan  Referral to podiatry even though this can take sometime due to his orange card health insurance.

## 2015-01-02 ENCOUNTER — Other Ambulatory Visit: Payer: Self-pay

## 2015-01-02 NOTE — Progress Notes (Signed)
Internal Medicine Clinic Attending  Case discussed with Dr. Kazibwe soon after the resident saw the patient.  We reviewed the resident's history and exam and pertinent patient test results.  I agree with the assessment, diagnosis, and plan of care documented in the resident's note. 

## 2015-02-09 ENCOUNTER — Encounter: Payer: No Typology Code available for payment source | Admitting: Internal Medicine

## 2015-02-12 ENCOUNTER — Telehealth: Payer: Self-pay | Admitting: Internal Medicine

## 2015-02-12 NOTE — Telephone Encounter (Addendum)
Call to patient to confirm appointment for 02/13/15 at 1:30 lmtcb

## 2015-02-13 ENCOUNTER — Encounter: Payer: No Typology Code available for payment source | Admitting: Internal Medicine

## 2015-03-23 ENCOUNTER — Ambulatory Visit: Payer: No Typology Code available for payment source

## 2015-05-04 ENCOUNTER — Other Ambulatory Visit: Payer: Self-pay | Admitting: *Deleted

## 2015-05-05 MED ORDER — MOMETASONE FUROATE 50 MCG/ACT NA SUSP: NASAL | Status: DC

## 2016-02-09 ENCOUNTER — Other Ambulatory Visit: Payer: Self-pay | Admitting: Pharmacist

## 2016-02-09 DIAGNOSIS — J449 Chronic obstructive pulmonary disease, unspecified: Secondary | ICD-10-CM

## 2016-02-10 ENCOUNTER — Ambulatory Visit: Payer: Self-pay | Admitting: Pharmacist

## 2016-02-10 MED ORDER — BUDESONIDE-FORMOTEROL FUMARATE 160-4.5 MCG/ACT IN AERO: INHALATION_SPRAY | RESPIRATORY_TRACT | Status: DC

## 2016-02-10 MED ORDER — ALBUTEROL SULFATE HFA 108 (90 BASE) MCG/ACT IN AERS: 2.0000 | INHALATION_SPRAY | Freq: Four times a day (QID) | RESPIRATORY_TRACT | Status: DC | PRN

## 2016-02-11 ENCOUNTER — Ambulatory Visit: Payer: Self-pay | Admitting: Pharmacist

## 2016-02-11 ENCOUNTER — Other Ambulatory Visit: Payer: Self-pay | Admitting: Pharmacist

## 2016-02-11 DIAGNOSIS — E7800 Pure hypercholesterolemia, unspecified: Secondary | ICD-10-CM

## 2016-02-11 MED ORDER — ATORVASTATIN CALCIUM 40 MG PO TABS: 40.0000 mg | ORAL_TABLET | Freq: Every day | ORAL | Status: DC

## 2016-02-11 NOTE — Progress Notes (Signed)
Medication Samples have been provided to the patient.  Drug: Proventil Strength: 3390mcg/act Qty: 1 inhaler LOT: [id number] Exp.Date: 09/2017 Dosing instructions: Take one to two puffs every 4-6 hours as needed for shortness of breath.  The patient has been instructed regarding the correct time, dose, and frequency of taking this medication, including desired effects and most common side effects.  Patient completed paperwork for medication assistance.  Samples signed for by Dr. Selena BattenKim.  Lenward Chancelloranya Makhlouf 4:27 PM 02/11/2016

## 2016-02-11 NOTE — Telephone Encounter (Signed)
Pt needs an appt with PCP  Thanks

## 2016-02-11 NOTE — Progress Notes (Signed)
Patient was seen in clinic with Tanya Makhlouf, PharmD candidate. I agree with the assessment and plan of care documented. 

## 2016-02-16 ENCOUNTER — Other Ambulatory Visit: Payer: Self-pay | Admitting: Pharmacist

## 2016-02-16 DIAGNOSIS — J4540 Moderate persistent asthma, uncomplicated: Secondary | ICD-10-CM

## 2016-02-16 DIAGNOSIS — E7800 Pure hypercholesterolemia, unspecified: Secondary | ICD-10-CM

## 2016-02-16 DIAGNOSIS — I10 Essential (primary) hypertension: Secondary | ICD-10-CM

## 2016-02-16 DIAGNOSIS — J309 Allergic rhinitis, unspecified: Secondary | ICD-10-CM

## 2016-02-16 DIAGNOSIS — J449 Chronic obstructive pulmonary disease, unspecified: Secondary | ICD-10-CM

## 2016-02-16 MED ORDER — MOMETASONE FURO-FORMOTEROL FUM 200-5 MCG/ACT IN AERO: 2.0000 | INHALATION_SPRAY | Freq: Two times a day (BID) | RESPIRATORY_TRACT | Status: DC

## 2016-02-16 MED ORDER — ATORVASTATIN CALCIUM 40 MG PO TABS: 40.0000 mg | ORAL_TABLET | Freq: Every day | ORAL | Status: DC

## 2016-02-16 MED ORDER — AMLODIPINE BESYLATE 5 MG PO TABS
5.0000 mg | ORAL_TABLET | Freq: Every day | ORAL | Status: DC
Start: 2016-02-16 — End: 2016-06-30

## 2016-02-16 MED ORDER — ALBUTEROL SULFATE (2.5 MG/3ML) 0.083% IN NEBU: 2.5000 mg | INHALATION_SOLUTION | Freq: Four times a day (QID) | RESPIRATORY_TRACT | Status: DC | PRN

## 2016-02-16 MED ORDER — MOMETASONE FUROATE 50 MCG/ACT NA SUSP: NASAL | Status: DC

## 2016-02-16 MED ORDER — ALBUTEROL SULFATE HFA 108 (90 BASE) MCG/ACT IN AERS: 2.0000 | INHALATION_SPRAY | Freq: Four times a day (QID) | RESPIRATORY_TRACT | Status: DC | PRN

## 2016-02-16 NOTE — Progress Notes (Signed)
Pharmacy student [facility], prescriptions transferred. Patient has PCP appointment 02/22/16.

## 2016-02-18 MED ORDER — LOSARTAN POTASSIUM 100 MG PO TABS: 100.0000 mg | ORAL_TABLET | Freq: Every day | ORAL | Status: DC

## 2016-02-18 NOTE — Addendum Note (Signed)
Addended by: Mliss FritzKIM, JENNIFER J on: 02/18/2016 01:53 PM   Modules accepted: Orders

## 2016-02-22 ENCOUNTER — Ambulatory Visit (INDEPENDENT_AMBULATORY_CARE_PROVIDER_SITE_OTHER): Payer: Self-pay | Admitting: Internal Medicine

## 2016-02-22 ENCOUNTER — Encounter: Payer: Self-pay | Admitting: Internal Medicine

## 2016-02-22 VITALS — BP 145/82 | HR 69 | Temp 98.2°F | Wt 216.0 lb

## 2016-02-22 DIAGNOSIS — Z79899 Other long term (current) drug therapy: Secondary | ICD-10-CM

## 2016-02-22 DIAGNOSIS — E7800 Pure hypercholesterolemia, unspecified: Secondary | ICD-10-CM

## 2016-02-22 DIAGNOSIS — F32A Depression, unspecified: Secondary | ICD-10-CM | POA: Insufficient documentation

## 2016-02-22 DIAGNOSIS — Z7189 Other specified counseling: Secondary | ICD-10-CM

## 2016-02-22 DIAGNOSIS — F329 Major depressive disorder, single episode, unspecified: Secondary | ICD-10-CM

## 2016-02-22 DIAGNOSIS — Z5189 Encounter for other specified aftercare: Secondary | ICD-10-CM

## 2016-02-22 DIAGNOSIS — I10 Essential (primary) hypertension: Secondary | ICD-10-CM

## 2016-02-22 DIAGNOSIS — J309 Allergic rhinitis, unspecified: Secondary | ICD-10-CM

## 2016-02-22 MED ORDER — ATORVASTATIN CALCIUM 40 MG PO TABS: 40.0000 mg | ORAL_TABLET | Freq: Every day | ORAL | 0 refills | Status: DC

## 2016-02-22 MED ORDER — LOSARTAN POTASSIUM 100 MG PO TABS: 100.0000 mg | ORAL_TABLET | Freq: Every day | ORAL | 0 refills | Status: DC

## 2016-02-22 MED ORDER — SALINE SPRAY 0.65 % NA SOLN: 2.0000 | NASAL | 0 refills | Status: DC | PRN

## 2016-02-22 MED ORDER — LORATADINE 10 MG PO TABS: 10.0000 mg | ORAL_TABLET | Freq: Every day | ORAL | 2 refills | Status: DC

## 2016-02-22 MED ORDER — CITALOPRAM HYDROBROMIDE 20 MG PO TABS: 20.0000 mg | ORAL_TABLET | Freq: Every day | ORAL | 2 refills | Status: DC

## 2016-02-22 MED ORDER — MOMETASONE FUROATE 50 MCG/ACT NA SUSP: 2.0000 | Freq: Every day | NASAL | 0 refills | Status: DC

## 2016-02-22 NOTE — Assessment & Plan Note (Signed)
Patient has been taking his olmesartan 40 mg daily. He never got the amlodipine as he did not have enough money to pay for it.   His blood pressure was 145/82 on just the ARB. He is going to switch to losartan 100 mg daily as that is available in [facility].   -Continue the olmesartan/ losartan -BMET today  -follow up in 6 weeks

## 2016-02-22 NOTE — Assessment & Plan Note (Signed)
He says he has been having chronic sinus issues for 2 years, but recently has been having runny nose, and some headaches. He takes the nasonex. Not on antihistamines. Says he has dry nose.   -Asked pt to take nasonex 2 sprays in each nares BID for 10 days, then go back to 2 sprays in each nare once a day -start claritin 10 mg daily -normal saline nasal spray

## 2016-02-22 NOTE — Assessment & Plan Note (Signed)
Given FOBT cards. No orange card so no colonoscopy referral Denies smoking, alcohol or illicits -Defer Hep C screening until he has the card

## 2016-02-22 NOTE — Assessment & Plan Note (Addendum)
Pt says he has been feeling down for few months. He says he can maybe sleep 45 minutes before he starts thinking about a variety of things. He can also get emotional and has been having crying episodes.  He says only him and his sister are left in family and his relatives are deceased.  He currently does not work and does not find interest in doing any activities except to read sometimes. Does not feel like he has any energy to do any activity. Denies SI. He also says he has lost appetite and so unintentionally has lost about 10 pounds, per epic chart since being last seen. Even though his PHQ 9 scale is 6, he does seem to have depression  -Start celexa 20 mg daily -follow up in 6 weeks to re-assess his symptoms -I was going to check TSH but he has no orange card right now, so will defer until he has one.

## 2016-02-22 NOTE — Assessment & Plan Note (Signed)
Out of lipitor for 3 months  -Sent refills -Defer lipid panel until he obtains orange card

## 2016-02-22 NOTE — Patient Instructions (Addendum)
Thank you for your visit today Please start taking claritin daily Please continue to use your nasonex spray- use 2 sprays in each nares twice a day for 10 days and go back to 2 sprays in each nares once a day Please start taking celexa daily  Please continue to use your losartan. Please do not start taking amlodipine as your blood pressure is at goal today. We will re-assess at next visit if this needs to be added on  Please bring your stool cards   Please follow up in 6 weeks

## 2016-02-22 NOTE — Progress Notes (Signed)
   CC: allergic rhinitis, HTN, depression, health maintenance HPI: [name] is a 55 y.o. man with PMH noted below, here for allergic rhinitis, HTN, depression, health maintenance  Please see Problem List/A&P for the status of the patient's chronic medical problems   Past Medical History:  Diagnosis Date  . Allergic rhinitis 04/25/2012  . Asthma, chronic 10/21/2010   Followed in Pulmonary clinic/ [facility]/ [facility]   - HFA 50% 01/04/2011  > 90% 02/04/2011   - PFT's wnl 02/04/2011  - no intubation before  - has been hospitalized once for asthma in 2012 - quit smoking in 2012 - allergic to aspirin and pollen   10/11/2014>>Repeat pulmonary function tests. Performed for disability application FEV1/FVC=68.5%, FEV1 48%. Consistent with COPD, severe Please see results under media   . Chronic pain syndrome 02/03/2011   Involving the shoulder joint  05/20/2013: right shoulder x ray >>Degenerative changes AC joint Seen by PT by 11/2013 >> improvement 03/13/2014 D/c from PT due to orange card being expired and pt cancelling appt   . COPD (chronic obstructive pulmonary disease) (HCC)    [place]as 6/13, potentially due to ASA use.   . Obesity (BMI 30-39.9)   . Substance abuse    cocaine 35 years ago    Review of Systems:  Denies fevers. Has some weight loss HEENT: runny nose, headaches,  PHQ9 = 6  Please see problem list for ROS  Physical Exam: Vitals:   02/22/16 1457  BP: (!) 145/82  Pulse: 69  Temp: 98.2 F (36.8 C)  TempSrc: Oral  Weight: 216 lb (98 kg)    General: A&O, in NAD HEENT: MMM, no orophary exudates, no erythema.  Neck: supple, midline trachea, no cervical lymphadenopathy  CV: RRR, normal s1, s2, no m/r/g, no carotid bruits appreciated Resp: equal and symmetric breath sounds, no wheezing or crackles  Abdomen: soft, nontender, nondistended, +BS Skin: warm, dry, intact, no open lesions or rashes noted DTRs normal and symmetric  Assessment & Plan:   See encounters tab for  problem based medical decision making. Patient discussed with Dr. Cleda Daub

## 2016-02-23 LAB — BMP8+ANION GAP
Anion Gap: 16 mmol/L (ref 10.0–18.0)
BUN / CREAT RATIO: 8 — AB (ref 9–20)
BUN: 8 mg/dL (ref 6–24)
CALCIUM: 8.9 mg/dL (ref 8.7–10.2)
CHLORIDE: 104 mmol/L (ref 96–106)
CO2: 25 mmol/L (ref 18–29)
CREATININE: 1 mg/dL (ref 0.76–1.27)
GFR calc Af Amer: 97 mL/min/{1.73_m2} (ref >59)
GFR calc non Af Amer: 84 mL/min/{1.73_m2} (ref >59)
GLUCOSE: 84 mg/dL (ref 65–99)
Potassium: 4 mmol/L (ref 3.5–5.2)
Sodium: 145 mmol/L — ABNORMAL HIGH (ref 134–144)

## 2016-02-23 NOTE — Progress Notes (Signed)
Internal Medicine Clinic Attending  Case discussed with Dr. Saraiya soon after the resident saw the patient.  We reviewed the resident's history and exam and pertinent patient test results.  I agree with the assessment, diagnosis, and plan of care documented in the resident's note.  

## 2016-03-11 ENCOUNTER — Other Ambulatory Visit: Payer: Self-pay | Admitting: *Deleted

## 2016-03-14 MED ORDER — OLMESARTAN MEDOXOMIL 40 MG PO TABS: 40.0000 mg | ORAL_TABLET | Freq: Every day | ORAL | 3 refills | Status: DC

## 2016-03-14 NOTE — Telephone Encounter (Signed)
Called to hd 

## 2016-03-18 NOTE — Addendum Note (Signed)
Addended by: Mliss FritzKIM, JENNIFER J on: 03/18/2016 05:40 PM   Modules accepted: Orders

## 2016-03-21 ENCOUNTER — Encounter: Payer: Self-pay | Admitting: Internal Medicine

## 2016-04-11 ENCOUNTER — Encounter: Payer: Self-pay | Admitting: Internal Medicine

## 2016-05-09 ENCOUNTER — Other Ambulatory Visit: Payer: Self-pay | Admitting: *Deleted

## 2016-05-13 MED ORDER — LORATADINE 10 MG PO TABS: 10.0000 mg | ORAL_TABLET | Freq: Every day | ORAL | 11 refills | Status: DC

## 2016-05-13 NOTE — Telephone Encounter (Signed)
Cancelled last 2 appts to assess response to celexa. Has Nov appt

## 2016-06-06 ENCOUNTER — Encounter: Payer: Self-pay | Admitting: Internal Medicine

## 2016-06-30 ENCOUNTER — Other Ambulatory Visit: Payer: Self-pay | Admitting: Pharmacist

## 2016-06-30 DIAGNOSIS — F3289 Other specified depressive episodes: Secondary | ICD-10-CM

## 2016-06-30 DIAGNOSIS — I10 Essential (primary) hypertension: Secondary | ICD-10-CM

## 2016-06-30 DIAGNOSIS — J309 Allergic rhinitis, unspecified: Secondary | ICD-10-CM

## 2016-06-30 DIAGNOSIS — J449 Chronic obstructive pulmonary disease, unspecified: Secondary | ICD-10-CM

## 2016-06-30 DIAGNOSIS — J4540 Moderate persistent asthma, uncomplicated: Secondary | ICD-10-CM

## 2016-06-30 DIAGNOSIS — E7800 Pure hypercholesterolemia, unspecified: Secondary | ICD-10-CM

## 2016-07-01 MED ORDER — ATORVASTATIN CALCIUM 40 MG PO TABS: 40.0000 mg | ORAL_TABLET | Freq: Every day | ORAL | 0 refills | Status: DC

## 2016-07-01 MED ORDER — LOSARTAN POTASSIUM 100 MG PO TABS: 100.0000 mg | ORAL_TABLET | Freq: Every day | ORAL | 0 refills | Status: DC

## 2016-07-01 MED ORDER — AMLODIPINE BESYLATE 5 MG PO TABS: 5.0000 mg | ORAL_TABLET | Freq: Every day | ORAL | 0 refills | Status: DC

## 2016-07-01 MED ORDER — LORATADINE 10 MG PO TABS: 10.0000 mg | ORAL_TABLET | Freq: Every day | ORAL | 11 refills | Status: DC

## 2016-07-01 MED ORDER — ALBUTEROL SULFATE HFA 108 (90 BASE) MCG/ACT IN AERS: 2.0000 | INHALATION_SPRAY | Freq: Four times a day (QID) | RESPIRATORY_TRACT | 6 refills | Status: DC | PRN

## 2016-07-01 MED ORDER — DULOXETINE HCL 40 MG PO CPEP: 40.0000 mg | ORAL_CAPSULE | Freq: Every day | ORAL | 0 refills | Status: DC

## 2016-07-01 MED ORDER — MOMETASONE FURO-FORMOTEROL FUM 200-5 MCG/ACT IN AERO: 2.0000 | INHALATION_SPRAY | Freq: Two times a day (BID) | RESPIRATORY_TRACT | 3 refills | Status: DC

## 2016-07-01 NOTE — Telephone Encounter (Signed)
Yes agree about duloxetine  Thanks!

## 2016-08-05 ENCOUNTER — Other Ambulatory Visit: Payer: Self-pay | Admitting: *Deleted

## 2016-08-05 MED ORDER — MOMETASONE FUROATE 50 MCG/ACT NA SUSP: 2.0000 | Freq: Every day | NASAL | 3 refills | Status: DC

## 2016-08-05 NOTE — Telephone Encounter (Signed)
[facility] informed of refill.

## 2016-08-08 ENCOUNTER — Telehealth: Payer: Self-pay | Admitting: *Deleted

## 2016-08-10 NOTE — Addendum Note (Signed)
Addended by: Remus BlakeBARROW, Donovyn Guidice K on: 08/10/2016 12:01 PM   Modules accepted: Orders

## 2016-08-11 NOTE — Telephone Encounter (Signed)
error 

## 2016-08-19 ENCOUNTER — Telehealth: Payer: Self-pay | Admitting: Internal Medicine

## 2016-08-19 NOTE — Telephone Encounter (Signed)
APT. REMINDER CALL, LMTCB °

## 2016-08-22 ENCOUNTER — Encounter: Payer: Self-pay | Admitting: Internal Medicine

## 2016-09-05 ENCOUNTER — Encounter: Payer: Self-pay | Admitting: Internal Medicine

## 2016-09-20 ENCOUNTER — Encounter: Payer: Self-pay | Admitting: Pharmacist

## 2016-09-20 DIAGNOSIS — I10 Essential (primary) hypertension: Secondary | ICD-10-CM

## 2016-09-20 DIAGNOSIS — F3289 Other specified depressive episodes: Secondary | ICD-10-CM

## 2016-09-20 DIAGNOSIS — J4540 Moderate persistent asthma, uncomplicated: Secondary | ICD-10-CM

## 2016-09-20 DIAGNOSIS — E7800 Pure hypercholesterolemia, unspecified: Secondary | ICD-10-CM

## 2016-10-03 ENCOUNTER — Encounter: Payer: Self-pay | Admitting: Internal Medicine

## 2016-11-14 ENCOUNTER — Other Ambulatory Visit: Payer: Self-pay | Admitting: Pharmacist

## 2016-11-14 DIAGNOSIS — F3289 Other specified depressive episodes: Secondary | ICD-10-CM

## 2016-11-14 DIAGNOSIS — E7800 Pure hypercholesterolemia, unspecified: Secondary | ICD-10-CM

## 2016-11-14 DIAGNOSIS — I10 Essential (primary) hypertension: Secondary | ICD-10-CM

## 2016-11-14 DIAGNOSIS — J4540 Moderate persistent asthma, uncomplicated: Secondary | ICD-10-CM

## 2016-11-14 DIAGNOSIS — J449 Chronic obstructive pulmonary disease, unspecified: Secondary | ICD-10-CM

## 2016-11-14 MED ORDER — ALBUTEROL SULFATE HFA 108 (90 BASE) MCG/ACT IN AERS: 2.0000 | INHALATION_SPRAY | Freq: Four times a day (QID) | RESPIRATORY_TRACT | 6 refills | Status: DC | PRN

## 2016-11-14 MED ORDER — MOMETASONE FUROATE 50 MCG/ACT NA SUSP: 2.0000 | Freq: Every day | NASAL | 3 refills | Status: DC

## 2016-11-14 MED ORDER — MOMETASONE FURO-FORMOTEROL FUM 200-5 MCG/ACT IN AERO: 2.0000 | INHALATION_SPRAY | Freq: Two times a day (BID) | RESPIRATORY_TRACT | 3 refills | Status: DC

## 2016-11-14 MED ORDER — DULOXETINE HCL 40 MG PO CPEP: 40.0000 mg | ORAL_CAPSULE | Freq: Every day | ORAL | 0 refills | Status: DC

## 2016-11-14 MED ORDER — ATORVASTATIN CALCIUM 40 MG PO TABS: 40.0000 mg | ORAL_TABLET | Freq: Every day | ORAL | 0 refills | Status: DC

## 2016-11-14 MED ORDER — LOSARTAN POTASSIUM 100 MG PO TABS: 100.0000 mg | ORAL_TABLET | Freq: Every day | ORAL | 0 refills | Status: DC

## 2016-11-14 MED ORDER — AMLODIPINE BESYLATE 5 MG PO TABS: 5.0000 mg | ORAL_TABLET | Freq: Every day | ORAL | 0 refills | Status: DC

## 2016-12-12 ENCOUNTER — Ambulatory Visit: Payer: Self-pay

## 2017-01-30 ENCOUNTER — Other Ambulatory Visit: Payer: Self-pay | Admitting: Internal Medicine

## 2017-01-30 DIAGNOSIS — I10 Essential (primary) hypertension: Secondary | ICD-10-CM

## 2017-01-30 DIAGNOSIS — E7800 Pure hypercholesterolemia, unspecified: Secondary | ICD-10-CM

## 2017-01-30 DIAGNOSIS — F3289 Other specified depressive episodes: Secondary | ICD-10-CM

## 2017-01-31 ENCOUNTER — Ambulatory Visit (INDEPENDENT_AMBULATORY_CARE_PROVIDER_SITE_OTHER): Payer: Self-pay | Admitting: Internal Medicine

## 2017-01-31 VITALS — BP 136/76 | HR 58 | Temp 98.1°F | Wt 203.3 lb

## 2017-01-31 DIAGNOSIS — J302 Other seasonal allergic rhinitis: Secondary | ICD-10-CM

## 2017-01-31 DIAGNOSIS — M25511 Pain in right shoulder: Secondary | ICD-10-CM

## 2017-01-31 DIAGNOSIS — J4540 Moderate persistent asthma, uncomplicated: Secondary | ICD-10-CM

## 2017-01-31 DIAGNOSIS — Z87891 Personal history of nicotine dependence: Secondary | ICD-10-CM

## 2017-01-31 DIAGNOSIS — M7552 Bursitis of left shoulder: Secondary | ICD-10-CM

## 2017-01-31 DIAGNOSIS — I10 Essential (primary) hypertension: Secondary | ICD-10-CM

## 2017-01-31 DIAGNOSIS — J449 Chronic obstructive pulmonary disease, unspecified: Secondary | ICD-10-CM

## 2017-01-31 DIAGNOSIS — E7800 Pure hypercholesterolemia, unspecified: Secondary | ICD-10-CM

## 2017-01-31 DIAGNOSIS — G894 Chronic pain syndrome: Secondary | ICD-10-CM

## 2017-01-31 DIAGNOSIS — Z7951 Long term (current) use of inhaled steroids: Secondary | ICD-10-CM

## 2017-01-31 DIAGNOSIS — S46111D Strain of muscle, fascia and tendon of long head of biceps, right arm, subsequent encounter: Secondary | ICD-10-CM

## 2017-01-31 DIAGNOSIS — Z886 Allergy status to analgesic agent status: Secondary | ICD-10-CM

## 2017-01-31 DIAGNOSIS — Z79899 Other long term (current) drug therapy: Secondary | ICD-10-CM

## 2017-01-31 MED ORDER — ALBUTEROL SULFATE HFA 108 (90 BASE) MCG/ACT IN AERS: 2.0000 | INHALATION_SPRAY | Freq: Four times a day (QID) | RESPIRATORY_TRACT | 6 refills | Status: DC | PRN

## 2017-01-31 MED ORDER — ATORVASTATIN CALCIUM 40 MG PO TABS: 40.0000 mg | ORAL_TABLET | Freq: Every day | ORAL | 3 refills | Status: DC

## 2017-01-31 MED ORDER — AMLODIPINE BESYLATE 5 MG PO TABS: 5.0000 mg | ORAL_TABLET | Freq: Every day | ORAL | 3 refills | Status: DC

## 2017-01-31 MED ORDER — MOMETASONE FURO-FORMOTEROL FUM 200-5 MCG/ACT IN AERO: 2.0000 | INHALATION_SPRAY | Freq: Two times a day (BID) | RESPIRATORY_TRACT | 3 refills | Status: DC

## 2017-01-31 MED ORDER — LORATADINE 10 MG PO TABS: 10.0000 mg | ORAL_TABLET | Freq: Every day | ORAL | 3 refills | Status: DC

## 2017-01-31 MED ORDER — LOSARTAN POTASSIUM 100 MG PO TABS: 100.0000 mg | ORAL_TABLET | Freq: Every day | ORAL | 3 refills | Status: DC

## 2017-01-31 NOTE — Assessment & Plan Note (Signed)
This problem is chronic and stable. While being treated for hypertension, the patient should continue taking atorvastatin, 40 mg as previously prescribed.

## 2017-01-31 NOTE — Progress Notes (Signed)
CC: Asthma follow up  HPI:  [name] is a 56 y.o. with a PMH of COPD, moderate persistent asthma, seasonal allergies, and HTN who is being seen for follow up of these chronic medical conditions. The patient states that he has had a progressively worsening cough for over a week now. The cough is persistent, dry, exacerbated by changes in temperature (worse in air condition and heat), and wakes him up from sleep at least twice a night. The cough is accompanied by wheezing and some SOB with exertion. The cough and SOB improve with use of his albuterol inhaler, which he has used 3-5 times per day for the past week. He continues to use his [facility] inhaler as prescribed. The patient reports being out of his albuterol inhaler but has been otherwise able to take all medication as prescribed.  He is also complaining of right wrist, left hip, and left foot pain. He states the pain is 8/10 and is described as a constant ache that occurs throughout the day. The pain occurs at rest and with activity. Nothing has helped relieve this pain. He is still able to complete his activities of daily living but he has become more concerned about this pain because it is not getting better. He states that this pain is similar to the chronic pain that he has in his right shoulder. He states that this chronic shoulder pain has been present for years and has not improved with time.   Past Medical History:  Diagnosis Date  . Allergic rhinitis 04/25/2012  . Asthma, chronic 10/21/2010   Followed in Pulmonary clinic/ [facility]/ [facility]   - HFA 50% 01/04/2011  > 90% 02/04/2011   - PFT's wnl 02/04/2011  - no intubation before  - has been hospitalized once for asthma in 2012 - quit smoking in 2012 - allergic to aspirin and pollen   10/11/2014>>Repeat pulmonary function tests. Performed for disability application FEV1/FVC=68.5%, FEV1 48%. Consistent with COPD, severe Please see results under media   . Chronic pain syndrome 02/03/2011     Involving the shoulder joint  05/20/2013: right shoulder x ray >>Degenerative changes AC joint Seen by PT by 11/2013 >> improvement 03/13/2014 D/c from PT due to orange card being expired and pt cancelling appt   . COPD (chronic obstructive pulmonary disease) (HCC)    [place]as 6/13, potentially due to ASA use.   . Obesity (BMI 30-39.9)   . Substance abuse    cocaine 35 years ago   Review of Systems:  Patient endorses nonproductive cough, wheezing, shortness of breath, congestion, and occasional headaches. Patient denies fevers, chills, chest pain, leg swelling, and abdominal pain.   Physical Exam:  Vitals:   01/31/17 0943  BP: 136/76  Pulse: (!) 58  Temp: 98.1 F (36.7 C)  TempSrc: Oral  SpO2: 98%  Weight: 203 lb 4.8 oz (92.2 kg)   Physical Exam  Cardiovascular: Normal rate, regular rhythm, normal heart sounds and intact distal pulses.   No murmur heard. Pulmonary/Chest: Effort normal. No respiratory distress. He has wheezes (throughout all long fields, no crackles or consolidation appreciated).  Abdominal: Soft. He exhibits no distension. There is no tenderness.  Musculoskeletal:  Decreased abduction of R shoulder (cannot raise above 90 degrees). Full ROM on L shoulder.  Full ROM on R and L wrist; no edema on R wrist with point tenderness over lateral aspect of R wrist joint.  Skin: Skin is warm and dry. Capillary refill takes less than 2 seconds.  Assessment & Plan:   See Encounters Tab for problem based charting.  Patient seen with Dr. Oswaldo Done.

## 2017-01-31 NOTE — Assessment & Plan Note (Signed)
Patient's BP below goal of 140/90 today. Plan to continue amlodipine 5 mg and losartan 100 mg daily.

## 2017-01-31 NOTE — Assessment & Plan Note (Signed)
The patient has multiple joints which have been bothering him recently. Physical exam and clinical ultrasound of his shoulder was consistent with degenerative changes and chronic rotator cuff injury. The patient has an allergy to NSAIDs and aspirin, so he cannot take these over the counter medications. He was instructed to take Tylenol for pain relief. Should his pain persist at follow up or significantly affect his activities of daily living, steroid injections should be considered.

## 2017-01-31 NOTE — Patient Instructions (Addendum)
Thank you for coming to see us today.   Please continue taking all medications as prescribed and use your albuterol inhaler as needed.   You are having an asthma flare. You will begin taking prednisone (two tablets once a day) for 5 days and follow up in 5 days to assess your symptoms.   This is exacerbated by your allergies. We recommend starting nasal irrigation to help manage your allergies. Please follow the instructions below for nasal irrigation. Continue taking your Nasonex after irrigation and please take Claritin daily to help manage your allergies.     BUFFERED ISOTONIC SALINE NASAL IRRIGATION  The Benefits: 1. When you irrigate, the isotonic saline (salt water) acts as a solvent and washes the mucus crusts and other debris from your nose.  2. This decongests and improves the airflow into your nose. The sinus passages begin to open.  3. Studies have also shown that a salt water and an alkaline (baking soda) irrigation solution improves nasal membrane cell function (mucociliary flow of mucus debris).  The Recipe: 1. Choose a 1-quart glass jar that is thoroughly cleansed.  2. Fill with sterile or distilled water, or you can boil water from the tap.  3. Add 1 to 2 heaping teaspoons of "pickling/canning/sea" salt (NOT table salt as it contains a large number of additives). This salt is available at the grocery store in the food canning section.  4. Add 1 teaspoon of Arm & Hammer Baking Soda (pure bicarbonate).  5. Mix ingredients together and store at room temperature. Discard after one week. If you find this solution too strong, you may decrease the amount of salt added to 1 to 1  teaspoons. With children it is often best to start with a milder solution and advance slowly. Irrigate with 240 ml (8 oz) twice daily.  The Instructions: You should plan to irrigate your nose with buffered isotonic saline 2 times per day. Many people prefer to warm the solution slightly in the  microwave - but be sure that the solution is NOT HOT. Stand over the sink (some do this in the shower) and squirt the solution into each side of your nose, keeping your mouth open. This allows you to spit the saltwater out of your mouth. It will not harm you if you swallow a little.  If you have been told to use a nasal steroid such as Flonase, Nasonex, or Nasacort, you should always use isortonic saline solution first, then use your nasal steroid product. The nasal steroid is much more effective when sprayed onto clean nasal membranes and the steroid medicine will reach deeper into the nose.  Most people experience a little burning sensation the first few times they use a isotonic saline solution, but this usually goes away within a few days.

## 2017-01-31 NOTE — Assessment & Plan Note (Signed)
The patient's symptoms, including increased cough and reliance on albuterol inhaler, and wheezing on physical exam are consistent with an asthma exacerbation. We will prescribe a short prednisone, 40 mg daily for 5 days. The patient was given an albuterol inhaler and told to use 2 puffs as needed during this exacerbation. He will continue using Dulera as prescribed. He will return to clinic in 5 days to reassess his symptoms. If symptoms persist, consider prednisone taper at this time.

## 2017-01-31 NOTE — Assessment & Plan Note (Signed)
This problem is chronic and during this patient's acute exacerbation he should continue to use his daily Dulera inhaler, 2 puffs BID.

## 2017-01-31 NOTE — Assessment & Plan Note (Signed)
The patient has a history of seasonal allergies and has complained of worsening congestion along with his cough. His asthma exacerbation likely has an allergic component to it and aggressive treatment of his allergies will likely help his symptoms. He was instructed to continue his regimen of daily loratadine and Nasonex. In addition he was told to begin daily nasal irrigation and was provided with instructions to complete this on his own. He will follow up in 5 days and we will reassess his symptoms.
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
## 2017-02-06 ENCOUNTER — Ambulatory Visit (INDEPENDENT_AMBULATORY_CARE_PROVIDER_SITE_OTHER): Payer: Self-pay | Admitting: Internal Medicine

## 2017-02-06 ENCOUNTER — Encounter: Payer: Self-pay | Admitting: Internal Medicine

## 2017-02-06 DIAGNOSIS — Z87891 Personal history of nicotine dependence: Secondary | ICD-10-CM

## 2017-02-06 DIAGNOSIS — J4540 Moderate persistent asthma, uncomplicated: Secondary | ICD-10-CM

## 2017-02-06 DIAGNOSIS — J45909 Unspecified asthma, uncomplicated: Secondary | ICD-10-CM

## 2017-02-06 NOTE — Assessment & Plan Note (Signed)
Last week the patient presented to the internal medicine center with signs and symptoms of acute reactive airway disease exacerbation. He had been using his albuterol inhaler 3-5 times a day. The albuterol inhaler was refilled and he was started on a 5 day prednisone course which she completed yesterday. It is suspected that his asthma exacerbation was related to seasonal allergies, he was encouraged to continue daily loratadine and Nasonex. Today he presents for follow up, his wheeze and shortness of breath with exertion are resolved today. -Continue Dulera inhaler twice daily  -Continue Albuterol PRN  - continue loratadine and nasonex for allergic rhinitis  - Stop taking prednisone  - RTC 2 months

## 2017-02-06 NOTE — Progress Notes (Signed)
   CC: Follow-up for asthma exacerbation  HPI:  [name] is a 56 y.o. with PMH of reactive airway disease, allergic rhinitis presents today for follow-up after clinic visit for asthma exacerbation and oligoarticular joint pain.  Past Medical History:  Diagnosis Date  . Allergic rhinitis 04/25/2012  . Asthma, chronic 10/21/2010   Followed in Pulmonary clinic/ [facility]/ [facility]   - HFA 50% 01/04/2011  > 90% 02/04/2011   - PFT's wnl 02/04/2011  - no intubation before  - has been hospitalized once for asthma in 2012 - quit smoking in 2012 - allergic to aspirin and pollen   10/11/2014>>Repeat pulmonary function tests. Performed for disability application FEV1/FVC=68.5%, FEV1 48%. Consistent with COPD, severe Please see results under media   . Chronic pain syndrome 02/03/2011   Involving the shoulder joint  05/20/2013: right shoulder x ray >>Degenerative changes AC joint Seen by PT by 11/2013 >> improvement 03/13/2014 D/c from PT due to orange card being expired and pt cancelling appt   . COPD (chronic obstructive pulmonary disease) (HCC)    exas 6/13, potentially due to ASA use.   . Obesity (BMI 30-39.9)   . Substance abuse    cocaine 35 years ago   Review of Systems:  Please refer to history of present illness and assessment and plans For pertinent review of systems, all others reviewed and negative.  Physical Exam:  Vitals:   02/06/17 1315  BP: (!) 150/82  Pulse: 69  Temp: 98.2 F (36.8 C)  TempSrc: Oral  SpO2: 97%  Weight: 199 lb 11.2 oz (90.6 kg)  Height: 6' (1.829 m)   Physical Exam  Constitutional: He is oriented to person, place, and time and well-developed, well-nourished, and in no distress. No distress.  HENT:  Head: Normocephalic and atraumatic.  Eyes: Conjunctivae are normal. Right eye exhibits no discharge. Left eye exhibits no discharge. No scleral icterus.  Cardiovascular: Normal rate and regular rhythm.   No murmur heard. No peripheral edema   Pulmonary/Chest:  Effort normal and breath sounds normal. No respiratory distress. He has no wheezes. He has no rales.  Neurological: He is alert and oriented to person, place, and time.  Skin: Skin is warm and dry. He is not diaphoretic.  Psychiatric: Affect and judgment normal.   Assessment & Plan:   Asthma exacerbation Last week the patient presented to the internal medicine center with signs and symptoms of acute reactive airway disease exacerbation. He had been using his albuterol inhaler 3-5 times a day. The albuterol inhaler was refilled and he was started on a 5 day prednisone course which she completed yesterday. It is suspected that his asthma exacerbation was related to seasonal allergies, he was encouraged to continue daily loratadine and Nasonex. Today he presents for follow up, his wheeze and shortness of breath with exertion are resolved today. -Continue Dulera inhaler twice daily  -Continue Albuterol PRN  - Stop taking prednisone  - RTC 2 months   Health maintenance  Patient is due for colon cancer screening. Last Hemoccult screening June 2015 was negative. He denies weight loss, night sweats, or melena. He has no known family hx of colon cancer. He was offered colonoscopy today but has opted for colon cancer screening cards.  - Follow up colon cancer screening card results  - Follow up screen in 1 year if these cards are negative, may need colonoscopy if cards are positive  See Encounters Tab for problem based charting.  Patient discussed with Dr. Oswaldo DoneVincent

## 2017-02-06 NOTE — Progress Notes (Signed)
Internal Medicine Clinic Attending  Case discussed with Dr. Blum at the time of the visit.  We reviewed the resident's history and exam and pertinent patient test results.  I agree with the assessment, diagnosis, and plan of care documented in the resident's note. 

## 2017-02-06 NOTE — Patient Instructions (Signed)
[name],   Josefa Halfm glad your asthma has gotten better with the steroids, continue using the albuterol as needed.   Please call the clinic if you need anything  Please schedule a follow up appointment with your primary care doctor in 3 months

## 2017-02-07 NOTE — Telephone Encounter (Signed)
Can you please call and find out what dose he is taking?  He was supposed to be on 40mg , but based on refill history he is either completely out or only taking 20mg .    Thank you.

## 2017-02-08 NOTE — Telephone Encounter (Signed)
I saw this patient last week. He was out of most meds, so this will be like a new start. 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 I have sent an order for cymbalta 30mg  daily to [facility].

## 2017-02-14 ENCOUNTER — Ambulatory Visit: Payer: Self-pay

## 2017-02-22 ENCOUNTER — Telehealth: Payer: Self-pay | Admitting: Pharmacist

## 2017-02-22 NOTE — Progress Notes (Signed)
Contacted patient for follow up with medication help. Unable to reach, pharmacy records indicate consistent refills. Left message for patient to call back if any medication questions or concerns arise.

## 2017-05-08 ENCOUNTER — Encounter: Payer: Self-pay | Admitting: Internal Medicine

## 2017-05-09 ENCOUNTER — Encounter: Payer: Self-pay | Admitting: Internal Medicine

## 2017-06-26 ENCOUNTER — Other Ambulatory Visit: Payer: Self-pay | Admitting: Internal Medicine

## 2017-06-26 DIAGNOSIS — J4540 Moderate persistent asthma, uncomplicated: Secondary | ICD-10-CM

## 2017-06-28 NOTE — Telephone Encounter (Signed)
Pt has appt scheduled for February 2019

## 2017-08-14 ENCOUNTER — Ambulatory Visit: Payer: Self-pay

## 2017-09-11 ENCOUNTER — Encounter: Payer: Self-pay | Admitting: Internal Medicine

## 2017-11-30 ENCOUNTER — Encounter: Payer: Medicaid Other | Admitting: Internal Medicine

## 2017-12-01 ENCOUNTER — Encounter: Payer: Medicaid Other | Admitting: Internal Medicine

## 2017-12-08 ENCOUNTER — Encounter: Payer: Medicaid Other | Admitting: Internal Medicine

## 2018-05-11 DIAGNOSIS — J449 Chronic obstructive pulmonary disease, unspecified: Secondary | ICD-10-CM | POA: Diagnosis not present

## 2018-05-11 DIAGNOSIS — I10 Essential (primary) hypertension: Secondary | ICD-10-CM | POA: Diagnosis not present

## 2018-05-11 DIAGNOSIS — R32 Unspecified urinary incontinence: Secondary | ICD-10-CM | POA: Diagnosis not present

## 2018-05-14 ENCOUNTER — Encounter: Payer: Self-pay | Admitting: Internal Medicine

## 2018-05-14 ENCOUNTER — Other Ambulatory Visit: Payer: Self-pay

## 2018-05-14 ENCOUNTER — Ambulatory Visit (INDEPENDENT_AMBULATORY_CARE_PROVIDER_SITE_OTHER): Payer: Medicaid Other | Admitting: Internal Medicine

## 2018-05-14 VITALS — BP 148/82 | HR 65 | Temp 98.0°F | Ht 72.0 in | Wt 201.9 lb

## 2018-05-14 DIAGNOSIS — G8921 Chronic pain due to trauma: Secondary | ICD-10-CM

## 2018-05-14 DIAGNOSIS — J4540 Moderate persistent asthma, uncomplicated: Secondary | ICD-10-CM

## 2018-05-14 DIAGNOSIS — Z23 Encounter for immunization: Secondary | ICD-10-CM | POA: Diagnosis not present

## 2018-05-14 DIAGNOSIS — Z7951 Long term (current) use of inhaled steroids: Secondary | ICD-10-CM | POA: Diagnosis not present

## 2018-05-14 DIAGNOSIS — Z79899 Other long term (current) drug therapy: Secondary | ICD-10-CM

## 2018-05-14 DIAGNOSIS — I10 Essential (primary) hypertension: Secondary | ICD-10-CM

## 2018-05-14 DIAGNOSIS — M5442 Lumbago with sciatica, left side: Secondary | ICD-10-CM

## 2018-05-14 DIAGNOSIS — J449 Chronic obstructive pulmonary disease, unspecified: Secondary | ICD-10-CM | POA: Diagnosis not present

## 2018-05-14 DIAGNOSIS — G8929 Other chronic pain: Secondary | ICD-10-CM

## 2018-05-14 MED ORDER — ALBUTEROL SULFATE (2.5 MG/3ML) 0.083% IN NEBU: INHALATION_SOLUTION | RESPIRATORY_TRACT | 4 refills | Status: DC

## 2018-05-14 MED ORDER — AMLODIPINE BESYLATE 10 MG PO TABS: 10.0000 mg | ORAL_TABLET | Freq: Every day | ORAL | 1 refills | Status: DC

## 2018-05-14 MED ORDER — ALBUTEROL SULFATE HFA 108 (90 BASE) MCG/ACT IN AERS: 2.0000 | INHALATION_SPRAY | Freq: Four times a day (QID) | RESPIRATORY_TRACT | 6 refills | Status: DC | PRN

## 2018-05-14 MED ORDER — GABAPENTIN 300 MG PO CAPS: ORAL_CAPSULE | ORAL | 1 refills | Status: DC

## 2018-05-14 MED ORDER — CETIRIZINE HCL 10 MG PO TABS: 10.0000 mg | ORAL_TABLET | Freq: Every day | ORAL | 1 refills | Status: DC

## 2018-05-14 MED ORDER — MOMETASONE FURO-FORMOTEROL FUM 200-5 MCG/ACT IN AERO: 2.0000 | INHALATION_SPRAY | Freq: Two times a day (BID) | RESPIRATORY_TRACT | 3 refills | Status: DC

## 2018-05-14 NOTE — Patient Instructions (Addendum)
Thank you for allowing [place] to provide your care. Today we are:  1. Increasing your amlodipine to 10 mg once daily. Continue your losartan 100mg  once daily.   2. I have sent out prescriptions for your inhalers, and we are switching you to Zyrtec for your allergies.   3. We are starting a medication called gabapentin for your nerve pain. You will take 600 mg before bed and 300 mg when you wake up. This medication can make you sleepy. Please let me know if you have any issues with this medication or feel it is not helping because we can increase it in a step wise fashion.   I will see you back in 3-6 months or sooner if anything arises.

## 2018-05-14 NOTE — Progress Notes (Signed)
   CC: F/U HTN and Asthma   HPI:  [name] is a 57 y.o. male who presented to the clinic for continued evaluation and management of his chronic medical illnesses. For a detailed assessment and plan please refer to problem based charting below.   Past Medical History:  Diagnosis Date  . Allergic rhinitis 04/25/2012  . Asthma, chronic 10/21/2010   Followed in Pulmonary clinic/ [facility]/ [facility]   - HFA 50% 01/04/2011  > 90% 02/04/2011   - PFT's wnl 02/04/2011  - no intubation before  - has been hospitalized once for asthma in 2012 - quit smoking in 2012 - allergic to aspirin and pollen   10/11/2014>>Repeat pulmonary function tests. Performed for disability application FEV1/FVC=68.5%, FEV1 48%. Consistent with COPD, severe Please see results under media   . Chronic pain syndrome 02/03/2011   Involving the shoulder joint  05/20/2013: right shoulder x ray >>Degenerative changes AC joint Seen by PT by 11/2013 >> improvement 03/13/2014 D/c from PT due to orange card being expired and pt cancelling appt   . COPD (chronic obstructive pulmonary disease) (HCC)    [place]as 6/13, potentially due to ASA use.   . Obesity (BMI 30-39.9)   . Substance abuse    cocaine 35 years ago   Review of Systems:  12 point ROS preformed. All negative aside from those mentioned in the HPI.  Physical Exam: Vitals:   05/14/18 1506  BP: (!) 148/82  Pulse: 65  Temp: 98 F (36.7 C)  TempSrc: Oral  SpO2: 98%  Weight: 201 lb 14.4 oz (91.6 kg)  Height: 6' (1.829 m)   General: Well nourished male in no acute distress Pulm: Good air movement with no wheezing or crackles  CV: RRR, no murmurs, no rubs  Abdomen: Soft, non-distended, no tenderness to palpation  Extremities: No LE edema   Assessment & Plan:   See Encounters Tab for problem based charting.  Patient discussed with Dr. Heide Spark

## 2018-05-15 ENCOUNTER — Encounter: Payer: Self-pay | Admitting: Internal Medicine

## 2018-05-15 DIAGNOSIS — M5442 Lumbago with sciatica, left side: Secondary | ICD-10-CM

## 2018-05-15 DIAGNOSIS — G8929 Other chronic pain: Secondary | ICD-10-CM | POA: Insufficient documentation

## 2018-05-15 LAB — BMP8+ANION GAP
Anion Gap: 18 mmol/L (ref 10.0–18.0)
BUN / CREAT RATIO: 8 — AB (ref 9–20)
BUN: 9 mg/dL (ref 6–24)
CHLORIDE: 101 mmol/L (ref 96–106)
CO2: 24 mmol/L (ref 20–29)
CREATININE: 1.17 mg/dL (ref 0.76–1.27)
Calcium: 9.1 mg/dL (ref 8.7–10.2)
GFR calc Af Amer: 79 mL/min/{1.73_m2} (ref >59)
GFR calc non Af Amer: 68 mL/min/{1.73_m2} (ref >59)
Glucose: 99 mg/dL (ref 65–99)
Potassium: 4.1 mmol/L (ref 3.5–5.2)
Sodium: 143 mmol/L (ref 134–144)

## 2018-05-15 NOTE — Assessment & Plan Note (Signed)
Currently stable on albuterol, dulera, and nasonex. He does endorse allergic symptoms including rhinorrhea and itching sensation. Previously on claritin without relief. He does need refills on his medications. Using the albuterol rescue inhaler 1-2 times per month. PE with unremarkable lung fields. Refills sent out.

## 2018-05-15 NOTE — Assessment & Plan Note (Signed)
Patient with uncontrolled HTN. He is currently on Losartan 100 mg and Amlodipine 5 mg. He is not experiencing any difficult obtaining these medications. He is unable to exercise due to chronic back pain as described else where. Denies headaches, CP, SOB, abdominal pain, LE swelling. On PE he does not have any LE edema. We discussed increasing his amlodipine to 10 mg QD and continuing his Losartan 100 mg QD. Repeat BMP today with normal renal function and potassium.

## 2018-05-15 NOTE — Progress Notes (Signed)
Internal Medicine Clinic Attending  Case discussed with Dr. Helberg at the time of the visit.  We reviewed the resident's history and exam and pertinent patient test results.  I agree with the assessment, diagnosis, and plan of care documented in the resident's note.    
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
## 2018-06-05 DIAGNOSIS — R32 Unspecified urinary incontinence: Secondary | ICD-10-CM | POA: Diagnosis not present

## 2018-06-05 DIAGNOSIS — J449 Chronic obstructive pulmonary disease, unspecified: Secondary | ICD-10-CM | POA: Diagnosis not present

## 2018-06-05 DIAGNOSIS — I10 Essential (primary) hypertension: Secondary | ICD-10-CM | POA: Diagnosis not present

## 2018-07-02 DIAGNOSIS — I10 Essential (primary) hypertension: Secondary | ICD-10-CM | POA: Diagnosis not present

## 2018-07-02 DIAGNOSIS — J449 Chronic obstructive pulmonary disease, unspecified: Secondary | ICD-10-CM | POA: Diagnosis not present

## 2018-07-02 DIAGNOSIS — R32 Unspecified urinary incontinence: Secondary | ICD-10-CM | POA: Diagnosis not present

## 2018-08-03 DIAGNOSIS — I10 Essential (primary) hypertension: Secondary | ICD-10-CM | POA: Diagnosis not present

## 2018-08-03 DIAGNOSIS — R32 Unspecified urinary incontinence: Secondary | ICD-10-CM | POA: Diagnosis not present

## 2018-09-03 DIAGNOSIS — I10 Essential (primary) hypertension: Secondary | ICD-10-CM | POA: Diagnosis not present

## 2018-09-03 DIAGNOSIS — R32 Unspecified urinary incontinence: Secondary | ICD-10-CM | POA: Diagnosis not present

## 2018-09-30 DIAGNOSIS — J449 Chronic obstructive pulmonary disease, unspecified: Secondary | ICD-10-CM | POA: Diagnosis not present

## 2018-09-30 DIAGNOSIS — R32 Unspecified urinary incontinence: Secondary | ICD-10-CM | POA: Diagnosis not present

## 2018-09-30 DIAGNOSIS — I10 Essential (primary) hypertension: Secondary | ICD-10-CM | POA: Diagnosis not present

## 2018-10-15 ENCOUNTER — Other Ambulatory Visit: Payer: Self-pay | Admitting: Internal Medicine

## 2018-10-15 DIAGNOSIS — J449 Chronic obstructive pulmonary disease, unspecified: Secondary | ICD-10-CM

## 2018-10-15 NOTE — Telephone Encounter (Signed)
Needs refill on albuterol (PROVENTIL HFA) 108 (90 Base) MCG/ACT inhaler  [id number] - [place], [place] - [address] AT [address]  ;pt contact [phone]

## 2018-10-16 ENCOUNTER — Telehealth: Payer: Self-pay | Admitting: *Deleted

## 2018-10-16 MED ORDER — ALBUTEROL SULFATE HFA 108 (90 BASE) MCG/ACT IN AERS: 2.0000 | INHALATION_SPRAY | Freq: Four times a day (QID) | RESPIRATORY_TRACT | 6 refills | Status: DC | PRN

## 2018-10-16 NOTE — Telephone Encounter (Signed)
Pt calls and states he is not using med assist anymore, called albuterol VO to [facility] [employer]

## 2018-10-18 ENCOUNTER — Encounter: Payer: Medicaid Other | Admitting: Internal Medicine

## 2018-10-25 ENCOUNTER — Ambulatory Visit (INDEPENDENT_AMBULATORY_CARE_PROVIDER_SITE_OTHER): Payer: Medicaid Other | Admitting: Internal Medicine

## 2018-10-25 ENCOUNTER — Other Ambulatory Visit: Payer: Self-pay

## 2018-10-25 DIAGNOSIS — E7800 Pure hypercholesterolemia, unspecified: Secondary | ICD-10-CM | POA: Diagnosis not present

## 2018-10-25 DIAGNOSIS — J449 Chronic obstructive pulmonary disease, unspecified: Secondary | ICD-10-CM

## 2018-10-25 DIAGNOSIS — M5442 Lumbago with sciatica, left side: Secondary | ICD-10-CM

## 2018-10-25 DIAGNOSIS — I10 Essential (primary) hypertension: Secondary | ICD-10-CM | POA: Diagnosis not present

## 2018-10-25 DIAGNOSIS — J4540 Moderate persistent asthma, uncomplicated: Secondary | ICD-10-CM

## 2018-10-25 DIAGNOSIS — G8929 Other chronic pain: Secondary | ICD-10-CM | POA: Diagnosis not present

## 2018-10-25 MED ORDER — AMLODIPINE BESYLATE 10 MG PO TABS: 10.0000 mg | ORAL_TABLET | Freq: Every day | ORAL | 1 refills | Status: DC

## 2018-10-25 MED ORDER — MOMETASONE FURO-FORMOTEROL FUM 200-5 MCG/ACT IN AERO: 2.0000 | INHALATION_SPRAY | Freq: Two times a day (BID) | RESPIRATORY_TRACT | 3 refills | Status: DC

## 2018-10-25 MED ORDER — LOSARTAN POTASSIUM 100 MG PO TABS: 100.0000 mg | ORAL_TABLET | Freq: Every day | ORAL | 3 refills | Status: DC

## 2018-10-25 MED ORDER — ALBUTEROL SULFATE HFA 108 (90 BASE) MCG/ACT IN AERS: 2.0000 | INHALATION_SPRAY | Freq: Four times a day (QID) | RESPIRATORY_TRACT | 6 refills | Status: DC | PRN

## 2018-10-25 MED ORDER — CETIRIZINE HCL 10 MG PO TABS: 10.0000 mg | ORAL_TABLET | Freq: Every day | ORAL | 1 refills | Status: DC

## 2018-10-25 MED ORDER — ALBUTEROL SULFATE (2.5 MG/3ML) 0.083% IN NEBU: INHALATION_SOLUTION | RESPIRATORY_TRACT | 4 refills | Status: DC

## 2018-10-25 MED ORDER — PREGABALIN 75 MG PO CAPS: 75.0000 mg | ORAL_CAPSULE | Freq: Two times a day (BID) | ORAL | 2 refills | Status: DC

## 2018-10-25 MED ORDER — MOMETASONE FUROATE 50 MCG/ACT NA SUSP: 2.0000 | Freq: Every day | NASAL | 3 refills | Status: DC

## 2018-10-25 MED ORDER — ATORVASTATIN CALCIUM 40 MG PO TABS: 40.0000 mg | ORAL_TABLET | Freq: Every day | ORAL | 3 refills | Status: DC

## 2018-10-25 NOTE — Patient Instructions (Signed)
Discussed with patient COVID precautions and if he begins to experience any symptoms we have a telephone service he can call. He voices understanding. All questions and concerns addressed.  
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
## 2018-10-25 NOTE — Assessment & Plan Note (Signed)
HPI: - Currently on amlodipine 10 mg and losartan 100 mg  - No issues affording or obtaining the medications  - No side effects  - No orthostatic symptoms   A/P: - Continue amlodipine 10 mg and losartan 100 mg  - BMP check at next visit

## 2018-10-25 NOTE — Progress Notes (Signed)
     CC:  F/u HTN, COPD, and chronic back pain.   HPI:  This is a telephone encounter between [name] and [employer] on 10/25/2018 for f/u HTN, COPD, and chronic back pain. The visit was conducted with the patient located at home and [facility]. The patient's identity was confirmed using their DOB and current address. The patient has consented to being evaluated through a telephone encounter and understands the associated risks (an examination cannot be done and the patient may need to come in for an appointment) / benefits (allows the patient to remain at home, decreasing exposure to coronavirus). I personally spent 11 minutes on medical discussion.   Past Medical History:  Diagnosis Date  . Allergic rhinitis 04/25/2012  . Asthma, chronic 10/21/2010   Followed in Pulmonary clinic/ [facility]/ [facility]   - HFA 50% 01/04/2011  > 90% 02/04/2011   - PFT's wnl 02/04/2011  - no intubation before  - has been hospitalized once for asthma in 2012 - quit smoking in 2012 - allergic to aspirin and pollen   10/11/2014>>Repeat pulmonary function tests. Performed for disability application FEV1/FVC=68.5%, FEV1 48%. Consistent with COPD, severe Please see results under media   . Chronic pain syndrome 02/03/2011   Involving the shoulder joint  05/20/2013: right shoulder x ray >>Degenerative changes AC joint Seen by PT by 11/2013 >> improvement 03/13/2014 D/c from PT due to orange card being expired and pt cancelling appt   . COPD (chronic obstructive pulmonary disease) (HCC)    exas 6/13, potentially due to ASA use.   . Obesity (BMI 30-39.9)   . Substance abuse (HCC)    cocaine 35 years ago   Review of Systems:  Performed and all others negative.  Assessment & Plan:   See Encounters Tab for problem based charting.  Patient discussed with Dr. Cyndie Chime

## 2018-10-25 NOTE — Assessment & Plan Note (Signed)
HPI: - Currently stable on albuterol, dulera, and nasonex - Continues to have some allergic symptoms but states they are manageable  - No recent hospitalizations or emergency room visits.  - Needs refills.  A/P: - Refills for  albuterol, dulera, and nasonex sent

## 2018-10-26 NOTE — Progress Notes (Signed)
Medicine attending: Medical history, presenting problems, and medications, reviewed with resident physician Dr Justin Helberg on the day of the patient telephone encounter and I concur with his evaluation and management plan. 

## 2018-10-30 ENCOUNTER — Telehealth: Payer: Self-pay | Admitting: *Deleted

## 2018-10-30 NOTE — Telephone Encounter (Signed)
Call to [employer] for PA for Mometasone 50 mcg Nasal Spray.  Information was given.  Approved 10/30/2018 thru 10/25/2019.  PA# [id number].  I [id number]. [facility] was [facility] and notified of.   Angelina Ok, RN 10/30/2018 12:45 PM.

## 2018-10-31 DIAGNOSIS — I10 Essential (primary) hypertension: Secondary | ICD-10-CM | POA: Diagnosis not present

## 2018-10-31 DIAGNOSIS — R32 Unspecified urinary incontinence: Secondary | ICD-10-CM | POA: Diagnosis not present

## 2018-10-31 DIAGNOSIS — J449 Chronic obstructive pulmonary disease, unspecified: Secondary | ICD-10-CM | POA: Diagnosis not present

## 2018-11-30 DIAGNOSIS — I10 Essential (primary) hypertension: Secondary | ICD-10-CM | POA: Diagnosis not present

## 2018-11-30 DIAGNOSIS — J449 Chronic obstructive pulmonary disease, unspecified: Secondary | ICD-10-CM | POA: Diagnosis not present

## 2018-11-30 DIAGNOSIS — R32 Unspecified urinary incontinence: Secondary | ICD-10-CM | POA: Diagnosis not present

## 2018-12-31 DIAGNOSIS — J449 Chronic obstructive pulmonary disease, unspecified: Secondary | ICD-10-CM | POA: Diagnosis not present

## 2018-12-31 DIAGNOSIS — R32 Unspecified urinary incontinence: Secondary | ICD-10-CM | POA: Diagnosis not present

## 2018-12-31 DIAGNOSIS — I10 Essential (primary) hypertension: Secondary | ICD-10-CM | POA: Diagnosis not present

## 2019-01-30 DIAGNOSIS — I10 Essential (primary) hypertension: Secondary | ICD-10-CM | POA: Diagnosis not present

## 2019-01-30 DIAGNOSIS — R32 Unspecified urinary incontinence: Secondary | ICD-10-CM | POA: Diagnosis not present

## 2019-01-30 DIAGNOSIS — J449 Chronic obstructive pulmonary disease, unspecified: Secondary | ICD-10-CM | POA: Diagnosis not present

## 2019-03-01 ENCOUNTER — Telehealth: Payer: Self-pay | Admitting: Internal Medicine
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
## 2019-03-05 DIAGNOSIS — J449 Chronic obstructive pulmonary disease, unspecified: Secondary | ICD-10-CM | POA: Diagnosis not present

## 2019-03-05 DIAGNOSIS — R32 Unspecified urinary incontinence: Secondary | ICD-10-CM | POA: Diagnosis not present

## 2019-03-05 DIAGNOSIS — I10 Essential (primary) hypertension: Secondary | ICD-10-CM | POA: Diagnosis not present

## 2019-03-07 ENCOUNTER — Ambulatory Visit (INDEPENDENT_AMBULATORY_CARE_PROVIDER_SITE_OTHER): Payer: Medicaid Other | Admitting: Internal Medicine

## 2019-03-07 ENCOUNTER — Encounter: Payer: Self-pay | Admitting: Internal Medicine

## 2019-03-07 ENCOUNTER — Other Ambulatory Visit: Payer: Self-pay

## 2019-03-07 VITALS — BP 141/75 | HR 70 | Temp 99.1°F | Ht 71.0 in | Wt 223.8 lb

## 2019-03-07 DIAGNOSIS — I10 Essential (primary) hypertension: Secondary | ICD-10-CM | POA: Diagnosis not present

## 2019-03-07 DIAGNOSIS — M5442 Lumbago with sciatica, left side: Secondary | ICD-10-CM

## 2019-03-07 DIAGNOSIS — G629 Polyneuropathy, unspecified: Secondary | ICD-10-CM | POA: Diagnosis not present

## 2019-03-07 DIAGNOSIS — Z79899 Other long term (current) drug therapy: Secondary | ICD-10-CM

## 2019-03-07 DIAGNOSIS — J4540 Moderate persistent asthma, uncomplicated: Secondary | ICD-10-CM

## 2019-03-07 DIAGNOSIS — G8929 Other chronic pain: Secondary | ICD-10-CM | POA: Diagnosis not present

## 2019-03-07 DIAGNOSIS — Z7951 Long term (current) use of inhaled steroids: Secondary | ICD-10-CM

## 2019-03-07 DIAGNOSIS — J449 Chronic obstructive pulmonary disease, unspecified: Secondary | ICD-10-CM

## 2019-03-07 MED ORDER — DULERA 200-5 MCG/ACT IN AERO: 2.0000 | INHALATION_SPRAY | Freq: Two times a day (BID) | RESPIRATORY_TRACT | 0 refills | Status: DC

## 2019-03-07 MED ORDER — PREGABALIN 75 MG PO CAPS: 75.0000 mg | ORAL_CAPSULE | Freq: Two times a day (BID) | ORAL | 2 refills | Status: DC

## 2019-03-07 NOTE — Patient Instructions (Signed)
Thank you for allowing me to provide your care. I have sent out refills on your medications. Please come back to see me in 6 months or sooner if anything arises.

## 2019-03-07 NOTE — Progress Notes (Signed)
   CC: F/u HTN, neuropathy, Asthma/COPD  HPI:  [name] is a 57 y.o. male with PMHx listed below presenting for F/u HTN, neuropathy, Asthma/COPD. Please see the A&P for the status of the patient's chronic medical problems.  Past Medical History:  Diagnosis Date  . Allergic rhinitis 04/25/2012  . Asthma, chronic 10/21/2010   Followed in Pulmonary clinic/ [facility]/ [facility]   - HFA 50% 01/04/2011  > 90% 02/04/2011   - PFT's wnl 02/04/2011  - no intubation before  - has been hospitalized once for asthma in 2012 - quit smoking in 2012 - allergic to aspirin and pollen   10/11/2014>>Repeat pulmonary function tests. Performed for disability application KPT4/SFK=81.2%, FEV1 48%. Consistent with COPD, severe Please see results under media   . Chronic pain syndrome 02/03/2011   Involving the shoulder joint  05/20/2013: right shoulder x ray >>Degenerative changes AC joint Seen by PT by 11/2013 >> improvement 03/13/2014 D/c from PT due to orange card being expired and pt cancelling appt   . COPD (chronic obstructive pulmonary disease) ([place])    exas 6/13, potentially due to ASA use.   . Obesity (BMI 30-39.9)   . Substance abuse ([place])    cocaine 35 years ago   Review of Systems: Performed and all others negative.  Physical Exam: Vitals:   03/07/19 1537  BP: (!) 141/75  Pulse: 70  Temp: 99.1 F (37.3 C)  TempSrc: Oral  SpO2: 97%  Weight: 223 lb 12.8 oz (101.5 kg)  Height: 5\' 11"  (1.803 m)   General: Well nourished male in no acute distress Pulm: Good air movement with no wheezing or crackles  CV: RRR, no murmurs, no rubs   Assessment & Plan:   See Encounters Tab for problem based charting.  Patient discussed with Dr. Evette Doffing

## 2019-03-08 ENCOUNTER — Other Ambulatory Visit: Payer: Self-pay | Admitting: *Deleted

## 2019-03-08 LAB — BMP8+ANION GAP
Anion Gap: 17 mmol/L (ref 10.0–18.0)
BUN/Creatinine Ratio: 14 (ref 9–20)
BUN: 18 mg/dL (ref 6–24)
CO2: 22 mmol/L (ref 20–29)
Calcium: 8.9 mg/dL (ref 8.7–10.2)
Chloride: 102 mmol/L (ref 96–106)
Creatinine, Ser: 1.25 mg/dL (ref 0.76–1.27)
GFR calc Af Amer: 72 mL/min/{1.73_m2} (ref >59)
GFR calc non Af Amer: 63 mL/min/{1.73_m2} (ref >59)
Glucose: 105 mg/dL — ABNORMAL HIGH (ref 65–99)
Potassium: 4.8 mmol/L (ref 3.5–5.2)
Sodium: 141 mmol/L (ref 134–144)

## 2019-03-08 NOTE — Assessment & Plan Note (Signed)
HPI:  Patient currently on albuterol and Dulera. States that he has been out of his [facility] for approximately two months. Since that time he has noticed more wheezing and coughing. He feels part of this is related to his allergies.  A/P: - Continue Albuterol  - Refill Dulera, symptoms previously well controlled on this medication

## 2019-03-08 NOTE — Telephone Encounter (Signed)
Requesting PA on   pregabalin (LYRICA) 75 MG capsule

## 2019-03-08 NOTE — Telephone Encounter (Signed)
Call to [employer] for PA for Lyrica .  Information was given.  Approved 03/08/2019 thru 03/02/2020.  I [id number].  [employer] [id number].  Sander Nephew, RN 03/08/2019 9:21 AM.

## 2019-03-08 NOTE — Assessment & Plan Note (Signed)
HPI:  Patient with bilateral lower extremity pain. This is been present for several years. He is tried gabapentin and Cymbalta without relief. Last time I evaluated him prescribed Lyrica however due to the co-pay he was unable to afford it. He would like to try something again for his leg pain. He denies any new weakness, change in his pain, or changes in bladder or bowel function.  A/P: - Lyrica 75 mg QD

## 2019-03-08 NOTE — Progress Notes (Signed)
Internal Medicine Clinic Attending  Case discussed with Dr. Helberg at the time of the visit.  We reviewed the resident's history and exam and pertinent patient test results.  I agree with the assessment, diagnosis, and plan of care documented in the resident's note.    

## 2019-03-08 NOTE — Assessment & Plan Note (Signed)
HPI:  Patient with well-controlled hypertension. He is currently on losartan 100 mg and amlodipine 10 mg daily. He is not experiencing any adverse effects from these medications. No issues affording these medications.  A/P: - BMP checked. Renal function and potassium stable. - Continue losartan 100 mg and amlodipine 10 mg

## 2019-04-02 DIAGNOSIS — I10 Essential (primary) hypertension: Secondary | ICD-10-CM | POA: Diagnosis not present

## 2019-04-02 DIAGNOSIS — R32 Unspecified urinary incontinence: Secondary | ICD-10-CM | POA: Diagnosis not present

## 2019-04-02 DIAGNOSIS — J449 Chronic obstructive pulmonary disease, unspecified: Secondary | ICD-10-CM | POA: Diagnosis not present

## 2019-04-03 DIAGNOSIS — R32 Unspecified urinary incontinence: Secondary | ICD-10-CM | POA: Diagnosis not present

## 2019-04-03 DIAGNOSIS — I10 Essential (primary) hypertension: Secondary | ICD-10-CM | POA: Diagnosis not present

## 2019-04-03 DIAGNOSIS — J449 Chronic obstructive pulmonary disease, unspecified: Secondary | ICD-10-CM | POA: Diagnosis not present

## 2019-05-03 DIAGNOSIS — R32 Unspecified urinary incontinence: Secondary | ICD-10-CM | POA: Diagnosis not present

## 2019-05-03 DIAGNOSIS — I10 Essential (primary) hypertension: Secondary | ICD-10-CM | POA: Diagnosis not present

## 2019-05-03 DIAGNOSIS — J449 Chronic obstructive pulmonary disease, unspecified: Secondary | ICD-10-CM | POA: Diagnosis not present

## 2019-06-03 DIAGNOSIS — J449 Chronic obstructive pulmonary disease, unspecified: Secondary | ICD-10-CM | POA: Diagnosis not present

## 2019-06-03 DIAGNOSIS — R32 Unspecified urinary incontinence: Secondary | ICD-10-CM | POA: Diagnosis not present

## 2019-06-03 DIAGNOSIS — I10 Essential (primary) hypertension: Secondary | ICD-10-CM | POA: Diagnosis not present

## 2019-06-06 DIAGNOSIS — R32 Unspecified urinary incontinence: Secondary | ICD-10-CM | POA: Diagnosis not present

## 2019-06-06 DIAGNOSIS — J449 Chronic obstructive pulmonary disease, unspecified: Secondary | ICD-10-CM | POA: Diagnosis not present

## 2019-06-06 DIAGNOSIS — I10 Essential (primary) hypertension: Secondary | ICD-10-CM | POA: Diagnosis not present

## 2019-07-03 DIAGNOSIS — I10 Essential (primary) hypertension: Secondary | ICD-10-CM | POA: Diagnosis not present

## 2019-07-03 DIAGNOSIS — J449 Chronic obstructive pulmonary disease, unspecified: Secondary | ICD-10-CM | POA: Diagnosis not present

## 2019-07-03 DIAGNOSIS — R32 Unspecified urinary incontinence: Secondary | ICD-10-CM | POA: Diagnosis not present

## 2019-08-07 DIAGNOSIS — I10 Essential (primary) hypertension: Secondary | ICD-10-CM | POA: Diagnosis not present

## 2019-08-07 DIAGNOSIS — R32 Unspecified urinary incontinence: Secondary | ICD-10-CM | POA: Diagnosis not present

## 2019-08-07 DIAGNOSIS — J449 Chronic obstructive pulmonary disease, unspecified: Secondary | ICD-10-CM | POA: Diagnosis not present

## 2019-09-04 ENCOUNTER — Other Ambulatory Visit: Payer: Self-pay | Admitting: *Deleted

## 2019-09-04 DIAGNOSIS — J4540 Moderate persistent asthma, uncomplicated: Secondary | ICD-10-CM

## 2019-09-04 DIAGNOSIS — J449 Chronic obstructive pulmonary disease, unspecified: Secondary | ICD-10-CM | POA: Diagnosis not present

## 2019-09-04 DIAGNOSIS — I10 Essential (primary) hypertension: Secondary | ICD-10-CM | POA: Diagnosis not present

## 2019-09-04 DIAGNOSIS — R32 Unspecified urinary incontinence: Secondary | ICD-10-CM | POA: Diagnosis not present

## 2019-09-04 MED ORDER — DULERA 200-5 MCG/ACT IN AERO: 2.0000 | INHALATION_SPRAY | Freq: Two times a day (BID) | RESPIRATORY_TRACT | 0 refills | Status: DC

## 2019-09-05 DIAGNOSIS — I10 Essential (primary) hypertension: Secondary | ICD-10-CM | POA: Diagnosis not present

## 2019-09-05 DIAGNOSIS — J449 Chronic obstructive pulmonary disease, unspecified: Secondary | ICD-10-CM | POA: Diagnosis not present

## 2019-09-05 DIAGNOSIS — R32 Unspecified urinary incontinence: Secondary | ICD-10-CM | POA: Diagnosis not present

## 2019-09-10 NOTE — Telephone Encounter (Signed)
Appt has been sch for 10/24/2019 with Dr. Caron Presume.

## 2019-10-17 ENCOUNTER — Other Ambulatory Visit: Payer: Self-pay | Admitting: Internal Medicine

## 2019-10-17 ENCOUNTER — Ambulatory Visit: Payer: Medicaid Other

## 2019-10-17 DIAGNOSIS — J449 Chronic obstructive pulmonary disease, unspecified: Secondary | ICD-10-CM

## 2019-10-17 DIAGNOSIS — J4540 Moderate persistent asthma, uncomplicated: Secondary | ICD-10-CM

## 2019-10-17 MED ORDER — ALBUTEROL SULFATE (2.5 MG/3ML) 0.083% IN NEBU: INHALATION_SOLUTION | RESPIRATORY_TRACT | 4 refills | Status: DC

## 2019-10-17 MED ORDER — ALBUTEROL SULFATE HFA 108 (90 BASE) MCG/ACT IN AERS: 2.0000 | INHALATION_SPRAY | Freq: Four times a day (QID) | RESPIRATORY_TRACT | 2 refills | Status: DC | PRN

## 2019-10-17 NOTE — Telephone Encounter (Signed)
Thank you Glenda... 

## 2019-10-17 NOTE — Telephone Encounter (Signed)
Need refill on albuterol (PROVENTIL HFA) 108 (90 Base) MCG/ACT inhaler ;pt contact [phone]   [id number] - [place], [place] - [address] AT [address]

## 2019-10-17 NOTE — Telephone Encounter (Signed)
Agree with ER. Thank you. Refills sent.
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
## 2019-10-17 NOTE — Telephone Encounter (Signed)
Call transferred from front office. Talked to pt - audible wheezing noted; stated he's out of albuterol,needs a refill. Very sob while talking. Stated "I'm having an asthma attack" Stated he's sweating ,unable to check temp. Instructed  pt to call 911;informed he needs assistance now and cannot wait for refills. Stated he will call 911.

## 2019-10-24 ENCOUNTER — Encounter: Payer: Medicaid Other | Admitting: Internal Medicine

## 2019-11-14 DIAGNOSIS — J449 Chronic obstructive pulmonary disease, unspecified: Secondary | ICD-10-CM | POA: Diagnosis not present

## 2019-11-14 DIAGNOSIS — I10 Essential (primary) hypertension: Secondary | ICD-10-CM | POA: Diagnosis not present

## 2019-11-14 DIAGNOSIS — R32 Unspecified urinary incontinence: Secondary | ICD-10-CM | POA: Diagnosis not present

## 2019-12-05 ENCOUNTER — Encounter: Payer: Self-pay | Admitting: *Deleted

## 2019-12-18 DIAGNOSIS — J449 Chronic obstructive pulmonary disease, unspecified: Secondary | ICD-10-CM | POA: Diagnosis not present

## 2019-12-18 DIAGNOSIS — I10 Essential (primary) hypertension: Secondary | ICD-10-CM | POA: Diagnosis not present

## 2019-12-18 DIAGNOSIS — R32 Unspecified urinary incontinence: Secondary | ICD-10-CM | POA: Diagnosis not present

## 2019-12-24 DIAGNOSIS — K029 Dental caries, unspecified: Secondary | ICD-10-CM | POA: Diagnosis not present

## 2019-12-31 ENCOUNTER — Telehealth: Payer: Self-pay | Admitting: *Deleted

## 2019-12-31 ENCOUNTER — Ambulatory Visit: Payer: Medicaid Other

## 2019-12-31 NOTE — Telephone Encounter (Signed)
Call to patient regarding missed appointment.  Message left tat the Clinics had called and to call for a rescheduled appointment.  Angelina Ok, RN 12/31/2019 2:35 PM.

## 2020-01-03 ENCOUNTER — Other Ambulatory Visit: Payer: Self-pay

## 2020-01-03 DIAGNOSIS — J4489 Other specified chronic obstructive pulmonary disease: Secondary | ICD-10-CM

## 2020-01-03 DIAGNOSIS — J4540 Moderate persistent asthma, uncomplicated: Secondary | ICD-10-CM

## 2020-01-03 DIAGNOSIS — J449 Chronic obstructive pulmonary disease, unspecified: Secondary | ICD-10-CM

## 2020-01-03 MED ORDER — ALBUTEROL SULFATE (2.5 MG/3ML) 0.083% IN NEBU: INHALATION_SOLUTION | RESPIRATORY_TRACT | 4 refills | Status: DC

## 2020-01-03 MED ORDER — ALBUTEROL SULFATE HFA 108 (90 BASE) MCG/ACT IN AERS: 2.0000 | INHALATION_SPRAY | Freq: Four times a day (QID) | RESPIRATORY_TRACT | 2 refills | Status: DC | PRN
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
## 2020-01-06 ENCOUNTER — Ambulatory Visit: Payer: Medicaid Other | Admitting: Internal Medicine

## 2020-01-06 VITALS — BP 163/101 | HR 94 | Temp 98.2°F | Ht 71.0 in | Wt 226.8 lb

## 2020-01-06 DIAGNOSIS — M5442 Lumbago with sciatica, left side: Secondary | ICD-10-CM

## 2020-01-06 DIAGNOSIS — G8929 Other chronic pain: Secondary | ICD-10-CM | POA: Diagnosis not present

## 2020-01-06 DIAGNOSIS — J45909 Unspecified asthma, uncomplicated: Secondary | ICD-10-CM

## 2020-01-06 DIAGNOSIS — I10 Essential (primary) hypertension: Secondary | ICD-10-CM

## 2020-01-06 DIAGNOSIS — E7800 Pure hypercholesterolemia, unspecified: Secondary | ICD-10-CM

## 2020-01-06 DIAGNOSIS — J449 Chronic obstructive pulmonary disease, unspecified: Secondary | ICD-10-CM

## 2020-01-06 DIAGNOSIS — J309 Allergic rhinitis, unspecified: Secondary | ICD-10-CM | POA: Diagnosis not present

## 2020-01-06 DIAGNOSIS — J4540 Moderate persistent asthma, uncomplicated: Secondary | ICD-10-CM

## 2020-01-06 DIAGNOSIS — J302 Other seasonal allergic rhinitis: Secondary | ICD-10-CM

## 2020-01-06 DIAGNOSIS — J310 Chronic rhinitis: Secondary | ICD-10-CM

## 2020-01-06 MED ORDER — PREGABALIN 75 MG PO CAPS: 75.0000 mg | ORAL_CAPSULE | Freq: Two times a day (BID) | ORAL | 2 refills | Status: DC

## 2020-01-06 MED ORDER — ALBUTEROL SULFATE (2.5 MG/3ML) 0.083% IN NEBU: INHALATION_SOLUTION | RESPIRATORY_TRACT | 4 refills | Status: DC

## 2020-01-06 MED ORDER — ATORVASTATIN CALCIUM 40 MG PO TABS: 40.0000 mg | ORAL_TABLET | Freq: Every day | ORAL | 3 refills | Status: DC

## 2020-01-06 MED ORDER — LOSARTAN POTASSIUM 100 MG PO TABS: 100.0000 mg | ORAL_TABLET | Freq: Every day | ORAL | 3 refills | Status: DC

## 2020-01-06 MED ORDER — CETIRIZINE HCL 10 MG PO TABS: 10.0000 mg | ORAL_TABLET | Freq: Every day | ORAL | 1 refills | Status: DC

## 2020-01-06 MED ORDER — METHYLPREDNISOLONE SODIUM SUCC 40 MG IJ SOLR
125.0000 mg | Freq: Once | INTRAMUSCULAR | Status: AC
  Administered 2020-01-06: 125 mg via INTRAMUSCULAR

## 2020-01-06 MED ORDER — DULERA 200-5 MCG/ACT IN AERO: 2.0000 | INHALATION_SPRAY | Freq: Two times a day (BID) | RESPIRATORY_TRACT | 0 refills | Status: DC

## 2020-01-06 MED ORDER — METHYLPREDNISOLONE SODIUM SUCC 40 MG IJ SOLR: 60.0000 mg | Freq: Once | INTRAMUSCULAR | Status: DC

## 2020-01-06 MED ORDER — ALBUTEROL SULFATE HFA 108 (90 BASE) MCG/ACT IN AERS: 2.0000 | INHALATION_SPRAY | Freq: Four times a day (QID) | RESPIRATORY_TRACT | 0 refills | Status: DC | PRN

## 2020-01-06 MED ORDER — PREDNISONE 20 MG PO TABS: 40.0000 mg | ORAL_TABLET | Freq: Every day | ORAL | 0 refills | Status: AC

## 2020-01-06 MED ORDER — AMLODIPINE BESYLATE 10 MG PO TABS: 10.0000 mg | ORAL_TABLET | Freq: Every day | ORAL | 1 refills | Status: DC

## 2020-01-06 MED ORDER — MOMETASONE FUROATE 50 MCG/ACT NA SUSP: 2.0000 | Freq: Every day | NASAL | 3 refills | Status: DC

## 2020-01-06 NOTE — Progress Notes (Signed)
   CC: SOB  HPI:  [name] is a 59 y.o. M with a history as noted below who presents with shortness of breath.  Please refer to the problem based charting for further details.  Past Medical History:  Diagnosis Date  . Allergic rhinitis 04/25/2012  . Asthma, chronic 10/21/2010   Followed in Pulmonary clinic/ [facility]/ [facility]   - HFA 50% 01/04/2011  > 90% 02/04/2011   - PFT's wnl 02/04/2011  - no intubation before  - has been hospitalized once for asthma in 2012 - quit smoking in 2012 - allergic to aspirin and pollen   10/11/2014>>Repeat pulmonary function tests. Performed for disability application FEV1/FVC=68.5%, FEV1 48%. Consistent with COPD, severe Please see results under media   . Chronic pain syndrome 02/03/2011   Involving the shoulder joint  05/20/2013: right shoulder x ray >>Degenerative changes AC joint Seen by PT by 11/2013 >> improvement 03/13/2014 D/c from PT due to orange card being expired and pt cancelling appt   . COPD (chronic obstructive pulmonary disease) (HCC)    exas 6/13, potentially due to ASA use.   . Obesity (BMI 30-39.9)   . Substance abuse (HCC)    cocaine 35 years ago   Review of Systems: All systems are negative unless mentioned in the HPI.  Physical Exam:  Vitals:   01/06/20 1450  BP: (!) 163/101  Pulse: 94  Temp: 98.2 F (36.8 C)  TempSrc: Oral  SpO2: 95%  Weight: 226 lb 12.8 oz (102.9 kg)  Height: 5\' 11"  (1.803 m)   Physical Exam Vitals reviewed.  Constitutional:      General: He is not in acute distress.    Appearance: Normal appearance. He is normal weight. He is not ill-appearing, toxic-appearing or diaphoretic.  Cardiovascular:     Rate and Rhythm: Normal rate and regular rhythm.     Pulses: Normal pulses.     Heart sounds: No murmur. No friction rub. No gallop.   Pulmonary:     Effort: No respiratory distress.     Breath sounds: Wheezing (diffuse bilateral in all lung fields) present. No rales.     Comments: Increased WOB, audible  wheezing, prolonged expiration  Abdominal:     General: There is no distension.     Palpations: Abdomen is soft.     Tenderness: There is no abdominal tenderness. There is no guarding.  Musculoskeletal:     Right lower leg: No edema.     Left lower leg: No edema.  Neurological:     General: No focal deficit present.     Mental Status: He is alert and oriented to person, place, and time.  Psychiatric:        Mood and Affect: Mood normal.     Assessment & Plan:   See Encounters Tab for problem based charting.  Patient discussed with Dr. 

## 2020-01-06 NOTE — Patient Instructions (Signed)
Thank you for visiting [place] in clinic today.  Below is a summary of what we discussed:  1.  Asthma exacerbation -We gave you a shot of Solu-Medrol to decrease the inflammation in your lungs. -Start taking prednisone 40 mg daily.  You may start taking this medication tomorrow 6/8 and take it for 4 days. -I refilled all of your Dulera inhaler, albuterol inhaler, and albuterol nebulizer -I also refilled your other medications as well  2.  Follow-up -Please set up an appointment to see [place] in 1 week so we can monitor your progress  If you feel things are getting worse, or you have any questions or concerns please call us immediately so we can address it.

## 2020-01-07 NOTE — Assessment & Plan Note (Signed)
Patient has a history of well-controlled hypertension on losartan 100 mg daily and amlodipine 10 mg daily.  Today, he is hypertensive to 163/101.  I suspect this is due to his increased work of breathing and chest tightness.  I anticipate that with improvement in his respiratory status, his blood pressures will fall back to normal limits.  Plan: -Continue home losartan 100 mg daily and amlodipine 10 mg daily -Follow-up at next visit

## 2020-01-07 NOTE — Assessment & Plan Note (Signed)
Chronic and stable.  Requesting refill of Lipitor  Plan: -Lipitor 40 mg daily refilled

## 2020-01-07 NOTE — Assessment & Plan Note (Signed)
Patient has a history of bilateral lower extremity pain that is neuropathic in nature.  He has been on Lyrica 75 mg daily and has found benefit with this.  He is currently out of all of his medication and is requesting refill.  Plan: -Lyrica 75 mg daily refilled

## 2020-01-07 NOTE — Assessment & Plan Note (Signed)
Patient is prescribed albuterol and Dulera at home.  Unfortunately, the patient ran out of his inhaler medications for the past 2 weeks and has not been able to take anything.  His shortness of breath, wheezing and nonproductive cough have progressively increased during this time.  He currently complains of chest tightness and shortness of breath with exertion.  Denies any chest pain that radiates to jaw or arm.  On exam, the patient has diffuse wheezing with a prolonged expiratory phase.  He has not had any fevers or productive cough to suggest pneumonia.  Presentation most likely consistent with asthma exacerbation.  Plan: -Albuterol nebulizer refilled -Albuterol inhaler refilled -Dulera refilled -Methylprednisolone 125 mg given in clinic today -Prednisone 40 mg daily for 4 days

## 2020-01-08 ENCOUNTER — Telehealth: Payer: Self-pay | Admitting: *Deleted

## 2020-01-08 MED ORDER — MOMETASONE FUROATE 50 MCG/ACT NA SUSP: 2.0000 | Freq: Every day | NASAL | 3 refills | Status: DC

## 2020-01-08 MED ORDER — FLUTICASONE PROPIONATE 50 MCG/ACT NA SUSP: 1.0000 | Freq: Every day | NASAL | 0 refills | Status: DC

## 2020-01-08 NOTE — Telephone Encounter (Signed)
Call to [employer] for PA for Mometasone.. Not a preferred drug.  Patient will need to try and fail  Astepro, Azelastine, fluticasone, Ipratropium or Olopatadine Sprays.  Message to be sent to Dr. Lyn Hollingshead to consider a change in medication.  Angelina Ok, RN 01/08/2020 4:07 PM.

## 2020-01-08 NOTE — Addendum Note (Signed)
Addended by: Kirt Boys on: 01/08/2020 04:29 PM   Modules accepted: Orders

## 2020-01-08 NOTE — Assessment & Plan Note (Signed)
I was informed that the patients mometasone nasal spray is not covered by the patient's insurance.   Plan: -D/c mometasone nasal spray -Start flonase one spray each nare daily

## 2020-01-08 NOTE — Telephone Encounter (Signed)
I just switched it to Flonase. Thanks

## 2020-01-13 NOTE — Progress Notes (Signed)
Internal Medicine Clinic Attending  Case discussed with Dr. Alexander at the time of the visit.  We reviewed the resident's history and exam and pertinent patient test results.  I agree with the assessment, diagnosis, and plan of care documented in the resident's note.  

## 2020-01-14 DIAGNOSIS — R32 Unspecified urinary incontinence: Secondary | ICD-10-CM | POA: Diagnosis not present

## 2020-01-14 DIAGNOSIS — I10 Essential (primary) hypertension: Secondary | ICD-10-CM | POA: Diagnosis not present

## 2020-01-14 DIAGNOSIS — J449 Chronic obstructive pulmonary disease, unspecified: Secondary | ICD-10-CM | POA: Diagnosis not present

## 2020-02-03 DIAGNOSIS — R32 Unspecified urinary incontinence: Secondary | ICD-10-CM | POA: Diagnosis not present

## 2020-02-03 DIAGNOSIS — I10 Essential (primary) hypertension: Secondary | ICD-10-CM | POA: Diagnosis not present

## 2020-02-05 ENCOUNTER — Telehealth: Payer: Self-pay | Admitting: Student

## 2020-02-05 NOTE — Telephone Encounter (Signed)
Attempted to contact patient to schedule an appointment at the request of Dr. Mcarthur Rossetti.  Patient needs to be seen before surgical clearance form can be completed.  No answer left detailed message asking patient to please give me a call back.

## 2020-02-07 ENCOUNTER — Telehealth: Payer: Self-pay | Admitting: *Deleted

## 2020-02-07 ENCOUNTER — Other Ambulatory Visit: Payer: Self-pay

## 2020-02-07 ENCOUNTER — Ambulatory Visit: Payer: Medicaid Other | Admitting: Internal Medicine

## 2020-02-07 ENCOUNTER — Encounter: Payer: Self-pay | Admitting: Internal Medicine

## 2020-02-07 VITALS — BP 147/83 | HR 60 | Temp 98.4°F | Ht 71.0 in | Wt 236.9 lb

## 2020-02-07 DIAGNOSIS — I10 Essential (primary) hypertension: Secondary | ICD-10-CM

## 2020-02-07 DIAGNOSIS — R339 Retention of urine, unspecified: Secondary | ICD-10-CM

## 2020-02-07 DIAGNOSIS — J449 Chronic obstructive pulmonary disease, unspecified: Secondary | ICD-10-CM | POA: Diagnosis not present

## 2020-02-07 DIAGNOSIS — Z01818 Encounter for other preprocedural examination: Secondary | ICD-10-CM

## 2020-02-07 DIAGNOSIS — J302 Other seasonal allergic rhinitis: Secondary | ICD-10-CM

## 2020-02-07 DIAGNOSIS — J310 Chronic rhinitis: Secondary | ICD-10-CM

## 2020-02-07 DIAGNOSIS — G894 Chronic pain syndrome: Secondary | ICD-10-CM

## 2020-02-07 MED ORDER — ALBUTEROL SULFATE HFA 108 (90 BASE) MCG/ACT IN AERS: 2.0000 | INHALATION_SPRAY | Freq: Four times a day (QID) | RESPIRATORY_TRACT | 0 refills | Status: DC | PRN

## 2020-02-07 MED ORDER — TAMSULOSIN HCL 0.4 MG PO CAPS: 0.4000 mg | ORAL_CAPSULE | Freq: Every day | ORAL | 1 refills | Status: DC

## 2020-02-07 MED ORDER — FLUTICASONE PROPIONATE 50 MCG/ACT NA SUSP: 1.0000 | Freq: Every day | NASAL | 0 refills | Status: DC
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
## 2020-02-07 NOTE — Assessment & Plan Note (Signed)
This is well controlled with pregablin.  Plan: Continue pregablin 75mg  bid

## 2020-02-07 NOTE — Assessment & Plan Note (Addendum)
Patient presenting at request of oral/maxillofacial surgeon for preoperative examination for procedure with proposed anesthesia. He has very poor dentition and is proposed for extraction of 17 teeth, alveoloplasty, removal of bilateral lingual tori and reduction of the left maxillary tuberosity under general anesthesia.  Per RCRI, patient is low risk (class I, 3.9%) of 30-day death, MI or cardiac arrest.  NSQIP with 7.6% risk of serious complication, 11.8% of any complication, 6.8% risk of surgical site infection; 0.1% risk of death.   Given severity of his COPD/asthma, would recommend patient to be optimized on his COPD treatment prior to surgery

## 2020-02-07 NOTE — Assessment & Plan Note (Addendum)
Patient has history of COPD for which he is on Dulera and albuterol neb with albuterol rescue inhaler as needed.  He was previously seen by pulmonologist in 2012 and had normal PFTs; however, repeat PFTs in 2016 with reduced FEV1 and FEV1/FVC consistent with COPD. Most recent COPD exacerbation in 12/2019 in setting of medication noncompliance which was managed as outpatient. Today, patient endorses some shortness of breath but denies any chest pain, fever or cough.   On examination, patient is slightly tachypneic but lungs are clear to auscultation bilaterally. He does endorse some nasal congestion and postnasal drip which may be contributing to his dyspnea. He also endorses taking dulera once daily but is prescribed as twice daily. Suspect that this may be resulting in him having to use increasin amounts of rescue inhaler. Unlikely that this is an exacerbation or pneumonia; may possibly have worsening of his COPD for which he will need to follow up with pulmonologist. Unfortunately, unable to obtain labs and CXR to evaluate for other causes of his dyspnea during visit.  Followed up with patient via telephone; he noted slight improvement in breathing. Discussed with patient about lab work; he notes that he will present on Monday for this. Meanwhile, I instructed him to present to the ED if his dyspnea worsens. He expresses understanding.  Plan: Continue Dulera 2 puff twice daily, albuterol nebulizer and inhaler prn CBC with diff and CXR  Referral to pulmonology  ADDENDUM: CXR with atelectasis vs infiltrate in right middle lobe. CBC without any leukocytosis or eosinophilia noted. Patient notes mild improvement in his symptoms with the [facility]. He is scheduled to see pulmonology on 8/25. Will f/u recommendations

## 2020-02-07 NOTE — Assessment & Plan Note (Addendum)
Patient with history of BPH for which he was previously on tamsulosin 0.4mg  daily. Unclear why he is no longer taking this. He notes some ongoing straining when urinating. He denies any hematuria, dysuria or other urinary symptoms.  Plan: Tamsulosin 0.4mg  daily  Consider referral to urologist if persistent   ADDENDUM: Hx of urinary incontinence for which requiring incontinence supplies. No prior documentation of this noted. Given history of bladder injury in setting of pelvic fracture, will benefit from urology evaluation.   Plan: Incontinence supplies paper work filled out Referral to urology

## 2020-02-07 NOTE — Progress Notes (Signed)
   CC: dyspnea, evaluation for preoperative risk of procedure with general anesthesia   HPI:  [name] is a 59 y.o. male with PMHx as listed below presenting with concerns of dyspnea on exertion and evaluation for preoperative risk of oral surgery under general anesthesia. He notes slightly worsening dyspnea requiring him to use his rescue inhaler. He also endorses worsening nasal congestion and post nasal drip. Please see problem based charting for complete assessment and plan.  Past Medical History:  Diagnosis Date  . Allergic rhinitis 04/25/2012  . Asthma, chronic 10/21/2010   Followed in Pulmonary clinic/ [facility]/ [place]   - HFA 50% 01/04/2011  > 90% 02/04/2011   - PFT's wnl 02/04/2011  - no intubation before  - has been hospitalized once for asthma in 2012 - quit smoking in 2012 - allergic to aspirin and pollen   10/11/2014>>Repeat pulmonary function tests. Performed for disability application FEV1/FVC=68.5%, FEV1 48%. Consistent with COPD, severe Please see results under media   . Chronic pain syndrome 02/03/2011   Involving the shoulder joint  05/20/2013: right shoulder x ray >>Degenerative changes AC joint Seen by PT by 11/2013 >> improvement 03/13/2014 D/c from PT due to orange card being expired and pt cancelling appt   . COPD (chronic obstructive pulmonary disease) (HCC)    exas 6/13, potentially due to ASA use.   . Obesity (BMI 30-39.9)   . Substance abuse (HCC)    cocaine 35 years ago   Review of Systems:  Negative except as stated in HPI.   Physical Exam:  Vitals:   02/07/20 1053 02/07/20 1135  BP: (!) 162/82 (!) 147/83  Pulse: 64 60  Temp: 98.4 F (36.9 C)   TempSrc: Oral   SpO2: 93%   Weight: 236 lb 14.4 oz (107.5 kg)   Height: 5\' 11"  (1.803 m)    Physical Exam  Constitutional: Appears well-developed and well-nourished. No acute distress.  HENT: Normocephalic and atraumatic, EOMI, conjunctiva normal, moist mucous membranes; no significant cervical  lymphadenopathy; no tenderness to palpation of maxillary, frontal or ethmoid sinuses; mild cobblestoning  noted at the posterior oropharynx Cardiovascular: Normal rate, regular rhythm, S1 and S2 present, no murmurs, rubs, gallops.  Distal pulses intact Respiratory: No respiratory distress, no accessory muscle use.  Effort is normal.  Lungs are clear to auscultation bilaterally. GI: Nondistended, soft, nontender to palpation, normal active bowel sounds Musculoskeletal: Normal bulk and tone.  No peripheral edema noted. Neurological: Is alert and oriented x4, no apparent focal deficits noted. Skin: Warm and dry.  No rash, erythema, lesions noted. Psychiatric: Normal mood and affect. Behavior is normal. Judgment and thought content normal.    Assessment & Plan:   See Encounters Tab for problem based charting.  Patient discussed with Dr. 

## 2020-02-07 NOTE — Assessment & Plan Note (Signed)
BP 162/82 (repeat 147/83). He is on amlodipine 10mg  daily and losartan 100mg  daily. Appears that this has been around his normal range. He would benefit from tighter control with goal BP <140/80. Will continue to monitor for now. I suspect that the alpha-antagonist activity from his tamsulosin may also help with decreasing the BP to goal.  Plan: Continue amlodipine 10mg  daily and losartan 100mg  daily Follow up at next visit

## 2020-02-07 NOTE — Patient Instructions (Addendum)
[name],  It was a pleasure seeing you in clinic. Today we discussed:   Chronic nasal congestion: I have sent a prescription for Flonase. Please use this daily. I will also send referral to ENT for your chronic sinusitis.  COPD/Asthma: Please continue to take your Dulera twice daily and Albuterol neb and rescue inhaler as needed. Please follow up with the pulmonologist.   BPH: I have sent a prescription for tamsulosin 0.4mg  daily.   If you have any questions or concerns, please call our clinic at [phone] between 9am-5pm and after hours call [phone] and ask for the internal medicine resident on call. If you feel you are having a medical emergency please call 911.   Thank you, we look forward to helping you remain healthy!

## 2020-02-07 NOTE — Assessment & Plan Note (Signed)
Patient has a history of allergic rhinitis for which he has been evaluated multiple times and is on zyrtec and nasonex. He denies any significant relief with current regimen. He has been trying for referral to ENT multiple times in the past; however, due to lack of insurance, has not been able to do so. He notes that his nasal congestion and post nasal drip are bothersome and notes that nothing significantly helps. Discussed that he has prescription for flonase. He is unaware of this and notes that he has not tried it yet. He is willing to try at this time.  Plan: Flonase 1 spray daily Referral to ENT

## 2020-02-10 NOTE — Progress Notes (Signed)
Internal Medicine Clinic Attending ° °Case discussed with Dr. Aslam  At the time of the visit.  We reviewed the resident’s history and exam and pertinent patient test results.  I agree with the assessment, diagnosis, and plan of care documented in the resident’s note.  °

## 2020-02-11 ENCOUNTER — Telehealth: Payer: Self-pay | Admitting: *Deleted

## 2020-02-11 ENCOUNTER — Other Ambulatory Visit: Payer: Self-pay | Admitting: *Deleted

## 2020-02-11 NOTE — Telephone Encounter (Signed)
Per front office, Transportation has been arranged for pt to come here tomorrow for labs then CXR. Pt called / informed to be ready by 0900 AM; Bened[facility] Goad or [employer] will be picking him up - they will be calling him to let him know who will be coming, kind of car. Pt voiced understanding.

## 2020-02-11 NOTE — Telephone Encounter (Signed)
Thank you :)

## 2020-02-12 ENCOUNTER — Ambulatory Visit (HOSPITAL_COMMUNITY)
Admission: RE | Admit: 2020-02-12 | Discharge: 2020-02-12 | Disposition: A | Discharge status: Home / Self Care | Payer: Medicaid Other | Source: Ambulatory Visit | Attending: Internal Medicine | Admitting: Internal Medicine

## 2020-02-12 ENCOUNTER — Other Ambulatory Visit: Payer: Self-pay

## 2020-02-12 ENCOUNTER — Other Ambulatory Visit: Payer: Medicaid Other

## 2020-02-12 DIAGNOSIS — J449 Chronic obstructive pulmonary disease, unspecified: Secondary | ICD-10-CM | POA: Diagnosis not present

## 2020-02-12 DIAGNOSIS — I10 Essential (primary) hypertension: Secondary | ICD-10-CM | POA: Diagnosis not present

## 2020-02-12 DIAGNOSIS — J4489 Other specified chronic obstructive pulmonary disease: Secondary | ICD-10-CM

## 2020-02-12 DIAGNOSIS — R05 Cough: Secondary | ICD-10-CM | POA: Diagnosis not present

## 2020-02-12 DIAGNOSIS — R0602 Shortness of breath: Secondary | ICD-10-CM | POA: Diagnosis not present

## 2020-02-12 DIAGNOSIS — J310 Chronic rhinitis: Secondary | ICD-10-CM | POA: Diagnosis not present

## 2020-02-13 LAB — CBC WITH DIFFERENTIAL/PLATELET
Basophils Absolute: 0 10*3/uL (ref 0.0–0.2)
Basos: 1 %
EOS (ABSOLUTE): 0.8 10*3/uL — ABNORMAL HIGH (ref 0.0–0.4)
Eos: 11 %
Hematocrit: 49 % (ref 37.5–51.0)
Hemoglobin: 15.6 g/dL (ref 13.0–17.7)
Immature Grans (Abs): 0 10*3/uL (ref 0.0–0.1)
Immature Granulocytes: 0 %
Lymphocytes Absolute: 2.7 10*3/uL (ref 0.7–3.1)
Lymphs: 37 %
MCH: 28.9 pg (ref 26.6–33.0)
MCHC: 31.8 g/dL (ref 31.5–35.7)
MCV: 91 fL (ref 79–97)
Monocytes Absolute: 0.7 10*3/uL (ref 0.1–0.9)
Monocytes: 10 %
Neutrophils Absolute: 3 10*3/uL (ref 1.4–7.0)
Neutrophils: 41 %
Platelets: 246 10*3/uL (ref 150–450)
RBC: 5.39 x10E6/uL (ref 4.14–5.80)
RDW: 14.2 % (ref 11.6–15.4)
WBC: 7.1 10*3/uL (ref 3.4–10.8)

## 2020-02-13 LAB — BMP8+ANION GAP
Anion Gap: 15 mmol/L (ref 10.0–18.0)
BUN/Creatinine Ratio: 9 — ABNORMAL LOW (ref 10–24)
BUN: 11 mg/dL (ref 8–27)
CO2: 24 mmol/L (ref 20–29)
Calcium: 8.7 mg/dL (ref 8.6–10.2)
Chloride: 104 mmol/L (ref 96–106)
Creatinine, Ser: 1.18 mg/dL (ref 0.76–1.27)
GFR calc Af Amer: 77 mL/min/{1.73_m2} (ref >59)
GFR calc non Af Amer: 67 mL/min/{1.73_m2} (ref >59)
Glucose: 97 mg/dL (ref 65–99)
Potassium: 3.8 mmol/L (ref 3.5–5.2)
Sodium: 143 mmol/L (ref 134–144)

## 2020-02-13 LAB — HEPATITIS C ANTIBODY: Hep C Virus Ab: 0.3 s/co ratio (ref 0.0–0.9)

## 2020-02-19 ENCOUNTER — Telehealth: Payer: Self-pay | Admitting: *Deleted

## 2020-02-19 NOTE — Telephone Encounter (Signed)
Order, CMN, and OV notes from 02/07/2020 addressing need for urinary incontinence supplies faxed to Avie Arenas at [employer]. Fax confirmation receipt received. Kinnie Feil, BSN, RN-BC ;

## 2020-02-19 NOTE — Addendum Note (Signed)
Addended by: Eliezer Bottom on: 02/19/2020 11:50 AM   Modules accepted: Orders

## 2020-02-20 ENCOUNTER — Telehealth: Payer: Self-pay | Admitting: Student

## 2020-02-20 DIAGNOSIS — J343 Hypertrophy of nasal turbinates: Secondary | ICD-10-CM | POA: Diagnosis not present

## 2020-02-20 DIAGNOSIS — R430 Anosmia: Secondary | ICD-10-CM | POA: Diagnosis not present

## 2020-02-20 DIAGNOSIS — J310 Chronic rhinitis: Secondary | ICD-10-CM | POA: Diagnosis not present

## 2020-02-20 DIAGNOSIS — J330 Polyp of nasal cavity: Secondary | ICD-10-CM | POA: Diagnosis not present

## 2020-02-20 NOTE — Telephone Encounter (Signed)
   [name] DOB: [birth year] MRN: [id number]   RIDER WAIVER AND RELEASE OF LIABILITY  For purposes of improving physical access to our facilities, [facility] is pleased to partner with third parties to provide [facility] patients or other authorized individuals the option of convenient, on-demand ground transportation services (the Chiropractor") through use of the technology service that enables users to request on-demand ground transportation from independent third-party providers.  By opting to use and accept these [employer], I, the undersigned, hereby agree on behalf of myself, and on behalf of any minor child using the [employer] for whom I am the parent or legal guardian, as follows:  1. Science writer provided to me are provided by independent third-party transportation providers who are not [employer] or employees and who are unaffiliated with [employer]. 2. [facility] is neither a transportation carrier nor a common or public carrier. 3. [facility] has no control over the quality or safety of the transportation that occurs as a result of the [employer]. 4. [facility] [facility] cannot guarantee that any third-party transportation provider will complete any arranged transportation service. 5. [facility] makes no representation, warranty, or guarantee regarding the reliability, timeliness, quality, safety, suitability, or availability of any of the Transport Services or that they will be error free. 6. I fully understand that traveling by vehicle involves risks and dangers of serious bodily injury, including permanent disability, paralysis, and death. I agree, on behalf of myself and on behalf of any minor child using the Transport Services for whom I am the parent or legal guardian, that the entire risk arising out of my use of the [employer] remains solely with me, to the maximum extent permitted under applicable law. 7. The [employer]  are provided "as is" and "as available." [facility] disclaims all representations and warranties, express, implied or statutory, not expressly set out in these terms, including the implied warranties of merchantability and fitness for a particular purpose. 8. I hereby waive and release [facility], its agents, employees, officers, directors, representatives, insurers, attorneys, assigns, successors, subsidiaries, and affiliates from any and all past, present, or future claims, demands, liabilities, actions, causes of action, or suits of any kind directly or indirectly arising from acceptance and use of the [employer]. 9. I further waive and release [facility] and its affiliates from all present and future liability and responsibility for any injury or death to persons or damages to property caused by or related to the use of the [employer]. 10. I have read this Waiver and Release of Liability, and I understand the terms used in it and their legal significance. This Waiver is freely and voluntarily given with the understanding that my right (as well as the right of any minor child for whom I am the parent or legal guardian using the [employer]) to legal recourse against [facility] in connection with the [employer] is knowingly surrendered in return for use of these services.   I attest that I read the consent document to [name], gave [name] the opportunity to ask questions and answered the questions asked (if any). I affirm that [name] then provided consent for he's participation in this program.        [name]

## 2020-03-01 DIAGNOSIS — R32 Unspecified urinary incontinence: Secondary | ICD-10-CM | POA: Diagnosis not present

## 2020-03-16 ENCOUNTER — Other Ambulatory Visit: Payer: Self-pay | Admitting: Otolaryngology

## 2020-03-16 DIAGNOSIS — J339 Nasal polyp, unspecified: Secondary | ICD-10-CM

## 2020-03-16 DIAGNOSIS — J329 Chronic sinusitis, unspecified: Secondary | ICD-10-CM

## 2020-03-25 ENCOUNTER — Other Ambulatory Visit: Payer: Self-pay

## 2020-03-25 ENCOUNTER — Ambulatory Visit: Payer: Medicaid Other | Admitting: Pulmonary Disease

## 2020-03-25 ENCOUNTER — Encounter: Payer: Self-pay | Admitting: Pulmonary Disease

## 2020-03-25 ENCOUNTER — Institutional Professional Consult (permissible substitution): Payer: Medicaid Other | Admitting: Pulmonary Disease

## 2020-03-25 VITALS — BP 172/90 | HR 79 | Temp 97.5°F | Ht 71.0 in | Wt 254.4 lb

## 2020-03-25 DIAGNOSIS — J449 Chronic obstructive pulmonary disease, unspecified: Secondary | ICD-10-CM | POA: Diagnosis not present

## 2020-03-25 DIAGNOSIS — J441 Chronic obstructive pulmonary disease with (acute) exacerbation: Secondary | ICD-10-CM

## 2020-03-25 DIAGNOSIS — J4489 Other specified chronic obstructive pulmonary disease: Secondary | ICD-10-CM

## 2020-03-25 MED ORDER — AZITHROMYCIN 250 MG PO TABS: ORAL_TABLET | ORAL | 0 refills | Status: DC

## 2020-03-25 MED ORDER — AZITHROMYCIN 250 MG PO TABS
ORAL_TABLET | ORAL | 0 refills | Status: DC
Start: 2020-03-25 — End: 2020-03-25

## 2020-03-25 MED ORDER — SPIRIVA RESPIMAT 2.5 MCG/ACT IN AERS: 2.0000 | INHALATION_SPRAY | Freq: Every day | RESPIRATORY_TRACT | 0 refills | Status: DC

## 2020-03-25 MED ORDER — PREDNISONE 20 MG PO TABS
40.0000 mg | ORAL_TABLET | Freq: Every day | ORAL | 0 refills | Status: AC
Start: 2020-03-25 — End: 2020-03-30

## 2020-03-25 NOTE — Patient Instructions (Addendum)
COPD Exacerbation --CONTINUE Dulera TWO puffs TWICE a day --START Spiriva TWO puffs ONCE a day --START prednisone as instructed --START Z-pack as instructed  Follow-up with me at 12:00 PM on 04/09/20. (OK to override blocked time per Dr. Everardo All)  Please bring dental procedure clearance on next visit to evaluate

## 2020-03-25 NOTE — Progress Notes (Signed)
Subjective:   PATIENT ID: [name] GENDER: male DOB: 1961-03-07, MRN: [id number]   HPI  Chief Complaint  Patient presents with   Consult    shortness of breath with exertion, wheezing    Reason for Visit: New consult for asthma  [name] is a 59 year old male former smoker who wishes to re-establish care for asthma/COPD management.   He reports his symptoms are poorly controlled. He has coughing, wheezing and shortness of breath. His cough productive with yellow sputum. Heat and exertion worsens his symptoms. He uses Dulera two puffs twice a day. He reports using his albuterol inhaler at least 2-3 times a day. He previously was seen by Dr. Sherene Sires for COPD/asthma in 2012.  Denies chest pain, fever, chills, lower extremity edema.  Social History: Former smoker  1ppd x 40 yrs  Environmental exposures:  Chef x 18 years  I have personally reviewed patient's past medical/family/social history, allergies, current medications  Past Medical History:  Diagnosis Date   Allergic rhinitis 04/25/2012   Asthma, chronic 10/21/2010   Followed in Pulmonary clinic/ [facility]/ [place]   - HFA 50% 01/04/2011  > 90% 02/04/2011   - PFT's wnl 02/04/2011  - no intubation before  - has been hospitalized once for asthma in 2012 - quit smoking in 2012 - allergic to aspirin and pollen   10/11/2014>>Repeat pulmonary function tests. Performed for disability application FEV1/FVC=68.5%, FEV1 48%. Consistent with COPD, severe Please see results under media    Chronic pain syndrome 02/03/2011   Involving the shoulder joint  05/20/2013: right shoulder x ray >>Degenerative changes AC joint Seen by PT by 11/2013 >> improvement 03/13/2014 D/c from PT due to orange card being expired and pt cancelling appt    COPD (chronic obstructive pulmonary disease) (HCC)    exas 6/13, potentially due to ASA use.    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  Tobacco Use   Smoking status: Former Smoker    Packs/day: 1.00    Years: 15.00    Pack years: 15.00    Types: Cigarettes    Quit date: 10/30/2009    Years since quitting: 10.4   Smokeless tobacco: Never Used  Substance and Sexual Activity   Alcohol use: Not on file    Comment: Beer on wkends   Drug use: No    Comment: former cocaine use 35 years ago   Sexual activity: Not on file    Comment: unemployed, was a Investment banker, operational    Allergies  Allergen Reactions   Aspirin     SOB   Nsaids      Outpatient Medications Prior to Visit  Medication Sig Dispense Refill   albuterol (PROVENTIL HFA) 108 (90 Base) MCG/ACT inhaler Inhale 2 puffs into the lungs every 6 (six) hours as needed for wheezing or shortness of breath. 1 g 0   albuterol (PROVENTIL) (2.5 MG/3ML) 0.083% nebulizer solution INHALE 1 VIAL VIA NEBULIZER EVERY 6 HOURS AS NEEDED FOR WHEEZING OR SHORTNESS OF BREATH 75 mL 4   amLODipine (NORVASC) 10 MG tablet Take 1 tablet (10  mg total) by mouth daily. 90 tablet 1   atorvastatin (LIPITOR) 40 MG tablet Take 1 tablet (40 mg total) by mouth daily. 90 tablet 3   cetirizine (ZYRTEC) 10 MG tablet Take 1 tablet (10 mg total) by mouth daily. 90 tablet 1   fluticasone (FLONASE) 50 MCG/ACT nasal spray Place 1 spray into both nostrils daily. 1 g 0   losartan (COZAAR) 100 MG tablet Take 1 tablet (100 mg total) by mouth daily. 90 tablet 3   mometasone-formoterol (DULERA) 200-5 MCG/ACT AERO Inhale 2 puffs into the lungs 2 (two) times daily. 8.8 g 0   pregabalin (LYRICA) 75 MG capsule Take 1 capsule (75 mg total) by mouth 2 (two) times daily. 180 capsule 2   tamsulosin (FLOMAX) 0.4 MG CAPS capsule Take 1 capsule (0.4 mg total) by mouth daily. 30 capsule  1   No facility-administered medications prior to visit.    ROS   Objective:   Vitals:   03/25/20 1601  BP: (!) 172/90  Pulse: 79  Temp: (!) 97.5 F (36.4 C)  TempSrc: Other (Comment)  SpO2: 94%  Weight: 254 lb 6.4 oz (115.4 kg)  Height: 5\' 11"  (1.803 m)   SpO2: 94 % (room air) O2 Device: None (Room air)  Physical Exam: General: Well-appearing, no acute distress HENT: [place], AT Eyes: EOMI, no scleral icterus Respiratory: Clear to auscultation bilaterally.  No crackles, wheezing or rales Cardiovascular: RRR, -M/R/G, no JVD GI: BS+, soft, nontender Extremities:-Edema,-tenderness Neuro: AAO x4, CNII-XII grossly intact Skin: Intact, no rashes or bruising Psych: Normal mood, normal affect  Data Reviewed:  Imaging:  PFT:  Labs:      Assessment & Plan:   Discussion: 59 year old male with COPD. In active exacerbation. Discussed inhaler technique and compliance. Discussed clinical course of COPD.   COPD Exacerbation --CONTINUE Dulera TWO puffs TWICE a day --START Spiriva TWO puffs ONCE a day --START prednisone as instructed --START Z-pack as instructed  Follow-up with me at 12:00 PM on 04/09/20. (OK to override blocked time per Dr. 06/09/20)  Please bring dental procedure clearance on next visit to evaluate  Health Maintenance Immunization History  Administered Date(s) Administered   Influenza Split 04/22/2011   Influenza,inj,Quad PF,6+ Mos 05/20/2013, 05/21/2014, 05/14/2018   Pneumococcal Polysaccharide-23 04/22/2011   CT Lung Screen - not indicated  No orders of the defined types were placed in this encounter.  Meds ordered this encounter  Medications   predniSONE (DELTASONE) 20 MG tablet    Sig: Take 2 tablets (40 mg total) by mouth daily with breakfast for 5 days.    Dispense:  10 tablet    Refill:  0   DISCONTD: azithromycin (ZITHROMAX) 250 MG tablet    Sig: Take two tablets on the first day. Then take one tablet once a day until completed.     Dispense:  6 tablet    Refill:  0   DISCONTD: azithromycin (ZITHROMAX) 250 MG tablet    Sig: Take two tablets on the first day. Then take one tablet once a day until completed.    Dispense:  6 tablet    Refill:  0   DISCONTD: azithromycin (ZITHROMAX) 250 MG tablet    Sig: Take two tablets on the first day. Then take one tablet once a day until completed.    Dispense:  6 tablet    Refill:  0   Tiotropium Bromide Monohydrate (SPIRIVA RESPIMAT) 2.5 MCG/ACT AERS    Sig: Inhale 2 puffs into the lungs daily.  Dispense:  4 g    Refill:  0    Order Specific Question:   Lot Number?    Answer:   [id number]    Order Specific Question:   Expiration Date?    Answer:   09/29/2020    Order Specific Question:   Quantity    Answer:   1   azithromycin (ZITHROMAX) 250 MG tablet    Sig: Take two tablets on the first day. Then take one tablet once a day until completed.    Dispense:  6 tablet    Refill:  0    No follow-ups on file. F/u 04/09/20  I have spent a total time of 45-minutes on the day of the appointment reviewing prior documentation, coordinating care and discussing medical diagnosis and plan with the patient/family. Imaging, labs and tests included in this note have been reviewed and interpreted independently by me.  Austin Pinkham Mechele Collin, MD [facility] 03/25/2020 4:25 PM  Office Number [phone]

## 2020-03-27 ENCOUNTER — Ambulatory Visit
Admission: RE | Admit: 2020-03-27 | Discharge: 2020-03-27 | Disposition: A | Discharge status: Home / Self Care | Payer: Medicaid Other | Source: Ambulatory Visit | Attending: Otolaryngology | Admitting: Otolaryngology

## 2020-03-27 ENCOUNTER — Other Ambulatory Visit: Payer: Self-pay

## 2020-03-27 DIAGNOSIS — J3489 Other specified disorders of nose and nasal sinuses: Secondary | ICD-10-CM | POA: Diagnosis not present

## 2020-03-27 DIAGNOSIS — J339 Nasal polyp, unspecified: Secondary | ICD-10-CM

## 2020-03-27 DIAGNOSIS — J0100 Acute maxillary sinusitis, unspecified: Secondary | ICD-10-CM | POA: Diagnosis not present

## 2020-03-27 DIAGNOSIS — J322 Chronic ethmoidal sinusitis: Secondary | ICD-10-CM | POA: Diagnosis not present

## 2020-03-27 DIAGNOSIS — J320 Chronic maxillary sinusitis: Secondary | ICD-10-CM | POA: Diagnosis not present

## 2020-03-27 DIAGNOSIS — J329 Chronic sinusitis, unspecified: Secondary | ICD-10-CM

## 2020-04-03 DIAGNOSIS — J441 Chronic obstructive pulmonary disease with (acute) exacerbation: Secondary | ICD-10-CM | POA: Insufficient documentation

## 2020-04-09 ENCOUNTER — Ambulatory Visit: Payer: Medicaid Other | Admitting: Pulmonary Disease

## 2020-04-09 ENCOUNTER — Other Ambulatory Visit: Payer: Self-pay

## 2020-04-09 ENCOUNTER — Encounter: Payer: Self-pay | Admitting: Primary Care

## 2020-04-09 ENCOUNTER — Ambulatory Visit (INDEPENDENT_AMBULATORY_CARE_PROVIDER_SITE_OTHER): Payer: Medicaid Other | Admitting: Primary Care

## 2020-04-09 DIAGNOSIS — J441 Chronic obstructive pulmonary disease with (acute) exacerbation: Secondary | ICD-10-CM

## 2020-04-09 MED ORDER — AZITHROMYCIN 250 MG PO TABS
ORAL_TABLET | ORAL | 0 refills | Status: DC
Start: 2020-04-09 — End: 2020-05-05

## 2020-04-09 MED ORDER — SPIRIVA RESPIMAT 2.5 MCG/ACT IN AERS: 2.0000 | INHALATION_SPRAY | Freq: Every day | RESPIRATORY_TRACT | 5 refills | Status: DC

## 2020-04-09 MED ORDER — PREDNISONE 10 MG PO TABS: ORAL_TABLET | ORAL | 0 refills | Status: DC

## 2020-04-09 NOTE — Assessment & Plan Note (Addendum)
-   Seen on 03/25/20 to re-establish care. Treated for AECOPD with zpack (never received) and prednisone 40mg  x 5 days. Slight improvement in dyspnea with steriods. Patient continues to have productive cough and wheezing on exam. Resending Z-pack and extending prednisone course  - Continue Dulera 200 two puffs twice daily and Spiriva respimat two puffs once daily  - Encouraged patient to get covid vaccine, contact information given to make an appointment  - Follow-up 3 months with Dr. 

## 2020-04-09 NOTE — Patient Instructions (Addendum)
Pleasure meeting you today [name]  Recommendations: - Continue Dulera 200 two puffs twice daily - Continue Spiriva respimat two puffs once daily  - Resending in Z-pack and extending prednisone   Follow-up: - 3 months with Dr. Everardo All   ______________________________________________________________________________  On March 23, 2020, the FDA provided full approval to the Pfizer-BioNTech vaccine for the prevention of COVID-19 disease in individuals 37 and older. This is yet another positive step in the efforts to reduce serious from COVID-19 in our communities. The Pfizer vaccine is also authorized for emergency use for individuals 12 to 15 for prevention of serious COVID-19 related illness. Additionally, vaccines by [name] and [name] have been authorized for emergency use by the U.S. Food and Drug Administration and are recommended by the U.S. Centers for Disease Control and Prevention as safe and effective treatments for the prevention of serious illness from COVID-19.  While walk-ins are encouraged, vaccination appointments are still preferred. They can be made by going to [email or web address]. People without internet access or email accounts can call [phone], Monday through Friday between 7 a.m. and 7 p.m. for personal assistance.

## 2020-04-09 NOTE — Progress Notes (Signed)
@Patient  ID: , male    DOB: [birth year], 59 y.o.   MRN: [id number]  Chief Complaint  Patient presents with  . Follow-up    Feeling better, sob-better, no cough or wheeze    Referring provider: [id number], *  HPI: 59 year old male, former smoker quit in 2011 (15-pack-year history).  Past medical history significant for COPD with asthma, hypertension, chronic pain syndrome, right pulmonary lesion.  Patient of Dr. 2012, last seen in August 2021 to re-establish care. He was treated for acute COPD exacerbation with Z-Pak and prednisone.  He was started on Spiriva 2 puffs in addition to continuing Dulera twice daily.  04/09/2020  Patient presents today for 1 to 2-week follow-up for COPD exacerbation.  He is feeling some better.  Shortness of breath has improved.  He still has residual productive cough.  He only received prescription for prednisone 40mg  x 5 days but never was given prescription for azithromycin. He continues to take [facility] two puffs twice daily and is s still using sample of Spiriva respimat as prescribed. Denies fever, chills, sweats, chest tightness. He has not been vaccinated for Covid.  Discussed concerns with patient regarding vaccine, encouraged him to call and make an appointment through [facility] to receive vaccine.    Allergies  Allergen Reactions  . Aspirin     SOB  . Nsaids     Immunization History  Administered Date(s) Administered  . Influenza Split 04/22/2011, 05/10/2019  . Influenza,inj,Quad PF,6+ Mos 05/20/2013, 05/21/2014, 05/14/2018  . Pneumococcal Polysaccharide-23 04/22/2011    Past Medical History:  Diagnosis Date  . Allergic rhinitis 04/25/2012  . Asthma, chronic 10/21/2010   Followed in Pulmonary clinic/ [facility]/ [facility]   - HFA 50% 01/04/2011  > 90% 02/04/2011   - PFT's wnl 02/04/2011  - no intubation before  - has been hospitalized once for asthma in 2012 - quit smoking in 2012 - allergic to aspirin and pollen    10/11/2014>>Repeat pulmonary function tests. Performed for disability application FEV1/FVC=68.5%, FEV1 48%. Consistent with COPD, severe Please see results under media   . Chronic pain syndrome 02/03/2011   Involving the shoulder joint  05/20/2013: right shoulder x ray >>Degenerative changes AC joint Seen by PT by 11/2013 >> improvement 03/13/2014 D/c from PT due to orange card being expired and pt cancelling appt   . COPD (chronic obstructive pulmonary disease) (HCC)    exas 6/13, potentially due to ASA use.   03/15/2014 Hyperlipidemia   . Hypertension   . Obesity (BMI 30-39.9)   . Substance abuse (HCC)    cocaine 35 years ago    Tobacco History: Social History   Tobacco Use  Smoking Status Former Smoker  . Packs/day: 1.00  . Years: 15.00  . Pack years: 15.00  . Types: Cigarettes  . Quit date: 10/30/2009  . Years since quitting: 10.4  Smokeless Tobacco Never Used   Counseling given: Not Answered   Outpatient Medications Prior to Visit  Medication Sig Dispense Refill  . albuterol (PROVENTIL HFA) 108 (90 Base) MCG/ACT inhaler Inhale 2 puffs into the lungs every 6 (six) hours as needed for wheezing or shortness of breath. 1 g 0  . albuterol (PROVENTIL) (2.5 MG/3ML) 0.083% nebulizer solution INHALE 1 VIAL VIA NEBULIZER EVERY 6 HOURS AS NEEDED FOR WHEEZING OR SHORTNESS OF BREATH 75 mL 4  . amLODipine (NORVASC) 10 MG tablet Take 1 tablet (10 mg total) by mouth daily. 90 tablet 1  . atorvastatin (LIPITOR) 40 MG tablet Take 1  tablet (40 mg total) by mouth daily. 90 tablet 3  . cetirizine (ZYRTEC) 10 MG tablet Take 1 tablet (10 mg total) by mouth daily. 90 tablet 1  . fluticasone (FLONASE) 50 MCG/ACT nasal spray Place 1 spray into both nostrils daily. 1 g 0  . losartan (COZAAR) 100 MG tablet Take 1 tablet (100 mg total) by mouth daily. 90 tablet 3  . mometasone-formoterol (DULERA) 200-5 MCG/ACT AERO Inhale 2 puffs into the lungs 2 (two) times daily. 8.8 g 0  . pregabalin (LYRICA) 75 MG capsule Take 1  capsule (75 mg total) by mouth 2 (two) times daily. 180 capsule 2  . tamsulosin (FLOMAX) 0.4 MG CAPS capsule Take 1 capsule (0.4 mg total) by mouth daily. 30 capsule 1  . Tiotropium Bromide Monohydrate (SPIRIVA RESPIMAT) 2.5 MCG/ACT AERS Inhale 2 puffs into the lungs daily. 4 g 0  . azithromycin (ZITHROMAX) 250 MG tablet Take two tablets on the first day. Then take one tablet once a day until completed. (Patient not taking: Reported on 04/09/2020) 6 tablet 0   No facility-administered medications prior to visit.      Review of Systems  Review of Systems  Constitutional: Negative.   Respiratory: Positive for cough and shortness of breath. Negative for chest tightness and wheezing.      Physical Exam  BP (!) 152/68 (BP Location: Right Arm, Cuff Size: Normal)   Pulse 67   Temp 98.1 F (36.7 C) (Oral)   Ht 5\' 11"  (1.803 m)   Wt 250 lb 6.4 oz (113.6 kg)   SpO2 94%   BMI 34.92 kg/m  Physical Exam Constitutional:      Appearance: Normal appearance.  HENT:     Head: Normocephalic and atraumatic.     Mouth/Throat:     Comments: Deferred d/t masking Cardiovascular:     Rate and Rhythm: Normal rate and regular rhythm.  Pulmonary:     Effort: Pulmonary effort is normal.     Breath sounds: Normal breath sounds.     Comments: Cough Skin:    General: Skin is warm and dry.  Neurological:     General: No focal deficit present.     Mental Status: He is alert and oriented to person, place, and time. Mental status is at baseline.  Psychiatric:        Mood and Affect: Mood normal.        Behavior: Behavior normal.        Thought Content: Thought content normal.        Judgment: Judgment normal.      Lab Results:  CBC    Component Value Date/Time   WBC 7.1 02/12/2020 1007   WBC 10.8 (H) 03/28/2012 1415   RBC 5.39 02/12/2020 1007   RBC 5.02 03/28/2012 1415   HGB 15.6 02/12/2020 1007   HCT 49.0 02/12/2020 1007   PLT 246 02/12/2020 1007   MCV 91 02/12/2020 1007   MCH 28.9  02/12/2020 1007   MCH 30.1 03/28/2012 1415   MCHC 31.8 02/12/2020 1007   MCHC 33.0 03/28/2012 1415   RDW 14.2 02/12/2020 1007   LYMPHSABS 2.7 02/12/2020 1007   MONOABS 0.9 03/28/2012 1415   EOSABS 0.8 (H) 02/12/2020 1007   BASOSABS 0.0 02/12/2020 1007    BMET    Component Value Date/Time   NA 143 02/12/2020 1007   K 3.8 02/12/2020 1007   CL 104 02/12/2020 1007   CO2 24 02/12/2020 1007   GLUCOSE 97 02/12/2020 1007   GLUCOSE  81 02/26/2014 1501   BUN 11 02/12/2020 1007   CREATININE 1.18 02/12/2020 1007   CREATININE 1.27 02/26/2014 1501   CALCIUM 8.7 02/12/2020 1007   GFRNONAA 67 02/12/2020 1007   GFRNONAA 64 02/26/2014 1501   GFRAA 77 02/12/2020 1007   GFRAA 74 02/26/2014 1501    BNP No results found for: BNP  ProBNP    Component Value Date/Time   PROBNP 60.0 11/23/2010 0113    Imaging: CT MAXILLOFACIAL WO CONTRAST  Result Date: 03/27/2020 CLINICAL DATA:  Fusion protocol.  Sinus pain and pressure. EXAM: CT MAXILLOFACIAL WITHOUT CONTRAST TECHNIQUE: Multidetector CT images of the paranasal sinuses were obtained using the standard protocol without intravenous contrast. COMPARISON:  None. FINDINGS: Paranasal sinuses: Frontal: Complete opacification of the left frontal sinus with chronic mucoperiosteal thickening. Subtotal opacification of the right frontal sinus with perhaps minimal mucoperiosteal thickening. Complete opacification in both frontal ethmoid junction regions. Ethmoid: Complete opacification of both ethmoid sinus regions both anterior and posterior. Maxillary: Complete opacification of the left maxillary sinus with chronic mucoperiosteal thickening. Subtotal opacification of the right maxillary sinus without visible mucoperiosteal thickening. Sphenoid: Complete opacification of the left division with mucoperiosteal thickening. Subtotal opacification of the right division without mucoperiosteal thickening. Right ostiomeatal unit: Obscured by regional soft tissue  disease. Left ostiomeatal unit: Obscured by regional soft tissue disease. Nasal passages: Upper nasal passages are obstructed by soft tissue thickening, right more than left. Inferior nasal passages are patent. Intact nasal septum is midline. Anatomy: Minor pneumatization superior to both anterior ethmoid notches. Left olfactory groove is broader than the right. Depth approximately 7 mm. Sellar sphenoid pneumatization pattern. No dehiscence of carotid or optic canals. No onodi cell. Other: None IMPRESSION: 1. Widespread chronic inflammatory changes throughout the paranasal sinuses as outlined above. Ostiomeatal units obscured by regional soft tissue disease. 2. Complete opacification of the paranasal sinuses on the left with mucoperiosteal thickening. 3. Subtotal opacification of the paranasal sinuses on the right without the chronic mucoperiosteal changes. Electronically Signed   By: Paulina Fusi M.D.   On: 03/27/2020 13:55     Assessment & Plan:   COPD with acute exacerbation (HCC) - Seen on 03/25/20 to re-establish care. Treated for AECOPD with zpack (never received) and prednisone 40mg  x 5 days. Slight improvement in dyspnea with steriods. Patient continues to have productive cough and wheezing on exam. Resending Z-pack and extending prednisone course  - Continue Dulera 200 two puffs twice daily and Spiriva respimat two puffs once daily  - Encouraged patient to get covid vaccine, contact information given to make an appointment  - Follow-up 3 months with Dr. , NP 04/09/2020

## 2020-04-13 DIAGNOSIS — R32 Unspecified urinary incontinence: Secondary | ICD-10-CM | POA: Diagnosis not present

## 2020-04-21 ENCOUNTER — Other Ambulatory Visit: Payer: Self-pay | Admitting: Internal Medicine

## 2020-04-21 ENCOUNTER — Ambulatory Visit: Payer: Medicaid Other

## 2020-05-01 ENCOUNTER — Other Ambulatory Visit: Payer: Self-pay | Admitting: Student

## 2020-05-01 DIAGNOSIS — J4540 Moderate persistent asthma, uncomplicated: Secondary | ICD-10-CM

## 2020-05-01 DIAGNOSIS — I10 Essential (primary) hypertension: Secondary | ICD-10-CM

## 2020-05-01 DIAGNOSIS — J449 Chronic obstructive pulmonary disease, unspecified: Secondary | ICD-10-CM

## 2020-05-01 MED ORDER — ALBUTEROL SULFATE (2.5 MG/3ML) 0.083% IN NEBU: INHALATION_SOLUTION | RESPIRATORY_TRACT | 4 refills | Status: DC

## 2020-05-01 MED ORDER — ALBUTEROL SULFATE HFA 108 (90 BASE) MCG/ACT IN AERS: 2.0000 | INHALATION_SPRAY | Freq: Four times a day (QID) | RESPIRATORY_TRACT | 2 refills | Status: DC | PRN

## 2020-05-01 MED ORDER — AMLODIPINE BESYLATE 10 MG PO TABS: 10.0000 mg | ORAL_TABLET | Freq: Every day | ORAL | 1 refills | Status: DC

## 2020-05-01 NOTE — Telephone Encounter (Signed)
Need refill on albuterol (PROVENTIL HFA) 108 (90 Base) MCG/ACT inhaler  albuterol (PROVENTIL) (2.5 MG/3ML) 0.083% nebulizer solution amLODipine (NORVASC) 10 MG tablet Emergency inhaler ;pt contact [phone]   [id number] - [place], [place] - [address]

## 2020-05-01 NOTE — Telephone Encounter (Signed)
There should be refills on albuterol solution and norvasc - called pt, no answer,

## 2020-05-05 ENCOUNTER — Other Ambulatory Visit: Payer: Self-pay

## 2020-05-05 ENCOUNTER — Encounter (HOSPITAL_BASED_OUTPATIENT_CLINIC_OR_DEPARTMENT_OTHER): Payer: Self-pay | Admitting: Oral Surgery

## 2020-05-08 ENCOUNTER — Encounter (HOSPITAL_BASED_OUTPATIENT_CLINIC_OR_DEPARTMENT_OTHER)
Admission: RE | Admit: 2020-05-08 | Discharge: 2020-05-08 | Disposition: A | Discharge status: Home / Self Care | Payer: Medicaid Other | Source: Ambulatory Visit | Attending: Oral Surgery | Admitting: Oral Surgery

## 2020-05-08 ENCOUNTER — Other Ambulatory Visit (HOSPITAL_COMMUNITY)
Admission: RE | Admit: 2020-05-08 | Discharge: 2020-05-08 | Disposition: A | Discharge status: Home / Self Care | Payer: Medicaid Other | Source: Ambulatory Visit | Attending: Oral Surgery | Admitting: Oral Surgery

## 2020-05-08 DIAGNOSIS — Z01812 Encounter for preprocedural laboratory examination: Secondary | ICD-10-CM | POA: Insufficient documentation

## 2020-05-08 DIAGNOSIS — Z20822 Contact with and (suspected) exposure to covid-19: Secondary | ICD-10-CM | POA: Insufficient documentation

## 2020-05-08 LAB — SARS CORONAVIRUS 2 (TAT 6-24 HRS): SARS Coronavirus 2: NEGATIVE

## 2020-05-11 NOTE — Progress Notes (Signed)
Called and left message to confirm time change and make sure that he will be able to arrive at 1015 for surgery time 1145am tomorrow 05/12/2020 due to transportation.

## 2020-05-12 ENCOUNTER — Encounter (HOSPITAL_BASED_OUTPATIENT_CLINIC_OR_DEPARTMENT_OTHER): Payer: Self-pay | Admitting: Oral Surgery

## 2020-05-12 ENCOUNTER — Ambulatory Visit (HOSPITAL_BASED_OUTPATIENT_CLINIC_OR_DEPARTMENT_OTHER)
Admission: RE | Admit: 2020-05-12 | Discharge: 2020-05-12 | Disposition: A | Discharge status: Home / Self Care | Payer: Medicaid Other | Attending: Oral Surgery | Admitting: Oral Surgery

## 2020-05-12 ENCOUNTER — Encounter (HOSPITAL_BASED_OUTPATIENT_CLINIC_OR_DEPARTMENT_OTHER)
Admission: RE | Disposition: A | Discharge status: Home / Self Care | Payer: Self-pay | Source: Home / Self Care | Attending: Oral Surgery

## 2020-05-12 ENCOUNTER — Ambulatory Visit (HOSPITAL_BASED_OUTPATIENT_CLINIC_OR_DEPARTMENT_OTHER): Payer: Medicaid Other | Admitting: Anesthesiology

## 2020-05-12 ENCOUNTER — Other Ambulatory Visit (HOSPITAL_BASED_OUTPATIENT_CLINIC_OR_DEPARTMENT_OTHER): Payer: Self-pay | Admitting: Oral Surgery

## 2020-05-12 ENCOUNTER — Other Ambulatory Visit: Payer: Self-pay

## 2020-05-12 DIAGNOSIS — K219 Gastro-esophageal reflux disease without esophagitis: Secondary | ICD-10-CM | POA: Insufficient documentation

## 2020-05-12 DIAGNOSIS — Z886 Allergy status to analgesic agent status: Secondary | ICD-10-CM | POA: Insufficient documentation

## 2020-05-12 DIAGNOSIS — K029 Dental caries, unspecified: Secondary | ICD-10-CM | POA: Diagnosis not present

## 2020-05-12 DIAGNOSIS — J449 Chronic obstructive pulmonary disease, unspecified: Secondary | ICD-10-CM | POA: Insufficient documentation

## 2020-05-12 DIAGNOSIS — E669 Obesity, unspecified: Secondary | ICD-10-CM | POA: Insufficient documentation

## 2020-05-12 DIAGNOSIS — E785 Hyperlipidemia, unspecified: Secondary | ICD-10-CM | POA: Insufficient documentation

## 2020-05-12 DIAGNOSIS — K0530 Chronic periodontitis, unspecified: Secondary | ICD-10-CM | POA: Insufficient documentation

## 2020-05-12 DIAGNOSIS — Z87891 Personal history of nicotine dependence: Secondary | ICD-10-CM | POA: Insufficient documentation

## 2020-05-12 DIAGNOSIS — M278 Other specified diseases of jaws: Secondary | ICD-10-CM | POA: Diagnosis not present

## 2020-05-12 DIAGNOSIS — Z6834 Body mass index (BMI) 34.0-34.9, adult: Secondary | ICD-10-CM | POA: Diagnosis not present

## 2020-05-12 DIAGNOSIS — K056 Periodontal disease, unspecified: Secondary | ICD-10-CM | POA: Diagnosis not present

## 2020-05-12 DIAGNOSIS — I10 Essential (primary) hypertension: Secondary | ICD-10-CM | POA: Diagnosis not present

## 2020-05-12 DIAGNOSIS — K0889 Other specified disorders of teeth and supporting structures: Secondary | ICD-10-CM | POA: Diagnosis not present

## 2020-05-12 HISTORY — DX: Gastro-esophageal reflux disease without esophagitis: K21.9

## 2020-05-12 HISTORY — PX: ALVEOLOPLASTY: SHX5710

## 2020-05-12 HISTORY — PX: TOOTH EXTRACTION: SHX859

## 2020-05-12 SURGERY — DENTAL RESTORATION/EXTRACTIONS
Anesthesia: General | Site: Mouth

## 2020-05-12 MED ORDER — FENTANYL CITRATE (PF) 100 MCG/2ML IJ SOLN: 25.0000 ug | INTRAMUSCULAR | Status: DC | PRN

## 2020-05-12 MED ORDER — AMISULPRIDE (ANTIEMETIC) 5 MG/2ML IV SOLN: 10.0000 mg | Freq: Once | INTRAVENOUS | Status: DC | PRN

## 2020-05-12 MED ORDER — CEFAZOLIN SODIUM-DEXTROSE 2-4 GM/100ML-% IV SOLN
INTRAVENOUS | Status: AC
  Filled 2020-05-12: qty 100

## 2020-05-12 MED ORDER — PHENYLEPHRINE HCL (PRESSORS) 10 MG/ML IV SOLN
INTRAVENOUS | Status: DC | PRN
  Administered 2020-05-12 (×2): 80 ug via INTRAVENOUS

## 2020-05-12 MED ORDER — MIDAZOLAM HCL 5 MG/5ML IJ SOLN
INTRAMUSCULAR | Status: DC | PRN
  Administered 2020-05-12: 2 mg via INTRAVENOUS

## 2020-05-12 MED ORDER — LACTATED RINGERS IV SOLN: INTRAVENOUS | Status: DC

## 2020-05-12 MED ORDER — SUGAMMADEX SODIUM 200 MG/2ML IV SOLN
INTRAVENOUS | Status: DC | PRN
  Administered 2020-05-12: 250 mg via INTRAVENOUS

## 2020-05-12 MED ORDER — DEXAMETHASONE SODIUM PHOSPHATE 10 MG/ML IJ SOLN
INTRAMUSCULAR | Status: AC
  Filled 2020-05-12: qty 1

## 2020-05-12 MED ORDER — PROPOFOL 10 MG/ML IV BOLUS
INTRAVENOUS | Status: AC
  Filled 2020-05-12: qty 20

## 2020-05-12 MED ORDER — IPRATROPIUM-ALBUTEROL 0.5-2.5 (3) MG/3ML IN SOLN
3.0000 mL | Freq: Once | RESPIRATORY_TRACT | Status: AC
  Administered 2020-05-12: 3 mL via RESPIRATORY_TRACT

## 2020-05-12 MED ORDER — OXYCODONE-ACETAMINOPHEN 5-325 MG PO TABS: 1.0000 | ORAL_TABLET | ORAL | 0 refills | Status: DC | PRN

## 2020-05-12 MED ORDER — OXYCODONE HCL 5 MG/5ML PO SOLN: 5.0000 mg | Freq: Once | ORAL | Status: DC | PRN

## 2020-05-12 MED ORDER — PROPOFOL 10 MG/ML IV BOLUS
INTRAVENOUS | Status: DC | PRN
  Administered 2020-05-12: 200 mg via INTRAVENOUS

## 2020-05-12 MED ORDER — EPHEDRINE SULFATE 50 MG/ML IJ SOLN
INTRAMUSCULAR | Status: DC | PRN
  Administered 2020-05-12 (×3): 10 mg via INTRAVENOUS

## 2020-05-12 MED ORDER — AMOXICILLIN 500 MG PO CAPS: 500.0000 mg | ORAL_CAPSULE | Freq: Three times a day (TID) | ORAL | 0 refills | Status: DC

## 2020-05-12 MED ORDER — FENTANYL CITRATE (PF) 100 MCG/2ML IJ SOLN
INTRAMUSCULAR | Status: DC | PRN
  Administered 2020-05-12: 100 ug via INTRAVENOUS

## 2020-05-12 MED ORDER — LIDOCAINE 2% (20 MG/ML) 5 ML SYRINGE
INTRAMUSCULAR | Status: AC
  Filled 2020-05-12: qty 5

## 2020-05-12 MED ORDER — ONDANSETRON HCL 4 MG/2ML IJ SOLN
INTRAMUSCULAR | Status: AC
  Filled 2020-05-12: qty 2

## 2020-05-12 MED ORDER — SUGAMMADEX SODIUM 500 MG/5ML IV SOLN
INTRAVENOUS | Status: AC
  Filled 2020-05-12: qty 5

## 2020-05-12 MED ORDER — ONDANSETRON HCL 4 MG/2ML IJ SOLN: 4.0000 mg | Freq: Once | INTRAMUSCULAR | Status: DC | PRN

## 2020-05-12 MED ORDER — OXYCODONE HCL 5 MG PO TABS: 5.0000 mg | ORAL_TABLET | Freq: Once | ORAL | Status: DC | PRN

## 2020-05-12 MED ORDER — IPRATROPIUM-ALBUTEROL 0.5-2.5 (3) MG/3ML IN SOLN
RESPIRATORY_TRACT | Status: AC
  Filled 2020-05-12: qty 3

## 2020-05-12 MED ORDER — MIDAZOLAM HCL 2 MG/2ML IJ SOLN
INTRAMUSCULAR | Status: AC
  Filled 2020-05-12: qty 2

## 2020-05-12 MED ORDER — IPRATROPIUM-ALBUTEROL 0.5-2.5 (3) MG/3ML IN SOLN: 3.0000 mL | RESPIRATORY_TRACT | Status: DC

## 2020-05-12 MED ORDER — SODIUM CHLORIDE 0.9 % IV SOLN
INTRAVENOUS | Status: AC | PRN
  Administered 2020-05-12: 500 mL via INTRAMUSCULAR

## 2020-05-12 MED ORDER — FENTANYL CITRATE (PF) 100 MCG/2ML IJ SOLN
INTRAMUSCULAR | Status: AC
  Filled 2020-05-12: qty 2

## 2020-05-12 MED ORDER — ROCURONIUM BROMIDE 100 MG/10ML IV SOLN
INTRAVENOUS | Status: DC | PRN
  Administered 2020-05-12: 50 mg via INTRAVENOUS

## 2020-05-12 MED ORDER — LIDOCAINE-EPINEPHRINE 2 %-1:100000 IJ SOLN
INTRAMUSCULAR | Status: DC | PRN
  Administered 2020-05-12: 20 mL via INTRADERMAL

## 2020-05-12 MED ORDER — ALBUTEROL SULFATE HFA 108 (90 BASE) MCG/ACT IN AERS
INHALATION_SPRAY | RESPIRATORY_TRACT | Status: DC | PRN
  Administered 2020-05-12: 2 via RESPIRATORY_TRACT

## 2020-05-12 MED ORDER — DEXAMETHASONE SODIUM PHOSPHATE 4 MG/ML IJ SOLN
INTRAMUSCULAR | Status: DC | PRN
  Administered 2020-05-12: 5 mg via INTRAVENOUS

## 2020-05-12 MED ORDER — LIDOCAINE HCL (CARDIAC) PF 100 MG/5ML IV SOSY
PREFILLED_SYRINGE | INTRAVENOUS | Status: DC | PRN
  Administered 2020-05-12: 100 mg via INTRAVENOUS

## 2020-05-12 MED ORDER — CEFAZOLIN SODIUM-DEXTROSE 2-4 GM/100ML-% IV SOLN
2.0000 g | INTRAVENOUS | Status: AC
  Administered 2020-05-12: 2 g via INTRAVENOUS

## 2020-05-12 MED FILL — OXYCODONE-APAP 5-325MG: 5-325 | 5 days supply | Qty: 30 | Fill #0

## 2020-05-12 MED FILL — AMOXICILLIN 500 MG CAPSULE: 500 | 7 days supply | Qty: 21 | Fill #0

## 2020-05-12 SURGICAL SUPPLY — 46 items
BLADE SURG 15 STRL LF DISP TIS (BLADE) ×1 IMPLANT
BLADE SURG 15 STRL SS (BLADE) ×3
BNDG COHESIVE 2X5 TAN STRL LF (GAUZE/BANDAGES/DRESSINGS) IMPLANT
BNDG EYE OVAL (GAUZE/BANDAGES/DRESSINGS) IMPLANT
BUR CROSS CUT FISSURE 1.6 (BURR) ×2 IMPLANT
BUR CROSS CUT FISSURE 1.6MM (BURR) ×1
BUR EGG ELITE 4.0 (BURR) IMPLANT
BUR EGG ELITE 4.0MM (BURR)
CANISTER SUCT 1200ML W/VALVE (MISCELLANEOUS) ×3 IMPLANT
CATH ROBINSON RED A/P 10FR (CATHETERS) IMPLANT
COVER BACK TABLE 60X90IN (DRAPES) ×3 IMPLANT
COVER MAYO STAND STRL (DRAPES) ×3 IMPLANT
COVER WAND RF STERILE (DRAPES) IMPLANT
DECANTER SPIKE VIAL GLASS SM (MISCELLANEOUS) IMPLANT
DRAPE U-SHAPE 76X120 STRL (DRAPES) ×3 IMPLANT
GAUZE PACKING FOLDED 2  STR (GAUZE/BANDAGES/DRESSINGS) ×3
GAUZE PACKING FOLDED 2 STR (GAUZE/BANDAGES/DRESSINGS) ×1 IMPLANT
GAUZE PACKING IODOFORM 1/4X15 (PACKING) IMPLANT
GLOVE BIO SURGEON STRL SZ 6.5 (GLOVE) ×2 IMPLANT
GLOVE BIO SURGEON STRL SZ7.5 (GLOVE) ×3 IMPLANT
GLOVE BIO SURGEON STRL SZ8 (GLOVE) ×3 IMPLANT
GLOVE BIO SURGEONS STRL SZ 6.5 (GLOVE) ×1
GLOVE BIOGEL PI IND STRL 6.5 (GLOVE) ×1 IMPLANT
GLOVE BIOGEL PI INDICATOR 6.5 (GLOVE) ×2
GOWN STRL REUS W/ TWL LRG LVL3 (GOWN DISPOSABLE) ×1 IMPLANT
GOWN STRL REUS W/ TWL XL LVL3 (GOWN DISPOSABLE) ×1 IMPLANT
GOWN STRL REUS W/TWL LRG LVL3 (GOWN DISPOSABLE) ×3
GOWN STRL REUS W/TWL XL LVL3 (GOWN DISPOSABLE) ×3
IV NS 500ML (IV SOLUTION) ×3
IV NS 500ML BAXH (IV SOLUTION) ×1 IMPLANT
NEEDLE HYPO 22GX1.5 SAFETY (NEEDLE) ×3 IMPLANT
NS IRRIG 1000ML POUR BTL (IV SOLUTION) ×3 IMPLANT
PACK BASIN DAY SURGERY FS (CUSTOM PROCEDURE TRAY) ×3 IMPLANT
SLEEVE IRRIGATION ELITE 7 (MISCELLANEOUS) ×3 IMPLANT
SLEEVE SCD COMPRESS KNEE MED (MISCELLANEOUS) IMPLANT
SPONGE SURGIFOAM ABS GEL 12-7 (HEMOSTASIS) IMPLANT
SUT CHROMIC 3 0 PS 2 (SUTURE) ×3 IMPLANT
SYR BULB EAR ULCER 3OZ GRN STR (SYRINGE) ×3 IMPLANT
SYR CONTROL 10ML LL (SYRINGE) ×3 IMPLANT
TOOTHBRUSH ADULT (PERSONAL CARE ITEMS) IMPLANT
TOWEL GREEN STERILE FF (TOWEL DISPOSABLE) ×3 IMPLANT
TRAY DSU PREP LF (CUSTOM PROCEDURE TRAY) IMPLANT
TUBE CONNECTING 20'X1/4 (TUBING) ×1
TUBE CONNECTING 20X1/4 (TUBING) ×2 IMPLANT
TUBING IRRIGATION (MISCELLANEOUS) ×3 IMPLANT
YANKAUER SUCT BULB TIP NO VENT (SUCTIONS) ×3 IMPLANT

## 2020-05-12 NOTE — Transfer of Care (Signed)
Immediate Anesthesia Transfer of Care Note  Patient: [name]  Procedure(s) Performed: DENTAL RESTORATION/EXTRACTIONS (N/A Mouth) ALVEOLOPLASTY (N/A Mouth)  Patient Location: PACU  Anesthesia Type:General  Level of Consciousness: sedated  Airway & Oxygen Therapy: Patient Spontanous Breathing and Patient connected to face mask oxygen  Post-op Assessment: Report given to RN and Post -op Vital signs reviewed and stable  Post vital signs: Reviewed and stable  Last Vitals:  Vitals Value Taken Time  BP 157/109 05/12/20 1122  Temp 37 C 05/12/20 1122  Pulse 99 05/12/20 1126  Resp 18 05/12/20 1126  SpO2 97 % 05/12/20 1126  Vitals shown include unvalidated device data.  Last Pain:  Vitals:   05/12/20 0954  TempSrc: Oral  PainSc: 0-No pain      Patients Stated Pain Goal: 2 (05/12/20 0954)  Complications: No complications documented.

## 2020-05-12 NOTE — Anesthesia Postprocedure Evaluation (Signed)
Anesthesia Post Note  Patient: [name]  Procedure(s) Performed: DENTAL RESTORATION/EXTRACTIONS (N/A Mouth) ALVEOLOPLASTY (N/A Mouth)     Patient location during evaluation: PACU Anesthesia Type: General Level of consciousness: awake and alert Pain management: pain level controlled Vital Signs Assessment: post-procedure vital signs reviewed and stable Respiratory status: spontaneous breathing, nonlabored ventilation and respiratory function stable Cardiovascular status: blood pressure returned to baseline and stable Postop Assessment: no apparent nausea or vomiting Anesthetic complications: yes Comments: Pt experienced significant bronchospasm on induction, as well as continued intraoperative episodes of hypoxia. Would recommend that patient not have surgery at outpatient surgery center in future.   No complications documented.  Last Vitals:  Vitals:   05/12/20 1215 05/12/20 1315  BP: 136/86 115/65  Pulse: 86 83  Resp: 14 16  Temp:  37.2 C  SpO2: 93% 100%    Last Pain:  Vitals:   05/12/20 1315  TempSrc:   PainSc: 1                  Lucretia Kern

## 2020-05-12 NOTE — Discharge Instructions (Signed)

## 2020-05-12 NOTE — Anesthesia Preprocedure Evaluation (Addendum)
Anesthesia Evaluation  Patient identified by MRN, date of birth, ID band Patient awake    Reviewed: Allergy & Precautions, NPO status , Patient's Chart, lab work & pertinent test results  History of Anesthesia Complications Negative for: history of anesthetic complications  Airway Mallampati: II  TM Distance: >3 FB Neck ROM: Full    Dental  (+) Poor Dentition, Missing, Loose   Pulmonary asthma , COPD, former smoker,     + decreased breath sounds(-) wheezing      Cardiovascular hypertension, Normal cardiovascular exam     Neuro/Psych negative neurological ROS  negative psych ROS   GI/Hepatic Neg liver ROS, GERD  ,  Endo/Other  negative endocrine ROS  Renal/GU negative Renal ROS  negative genitourinary   Musculoskeletal negative musculoskeletal ROS (+)   Abdominal   Peds  Hematology negative hematology ROS (+)   Anesthesia Other Findings   Reproductive/Obstetrics                            Anesthesia Physical Anesthesia Plan  ASA: III  Anesthesia Plan: General   Post-op Pain Management:    Induction: Intravenous  PONV Risk Score and Plan: 2 and Ondansetron, Dexamethasone, Treatment may vary due to age or medical condition and Midazolam  Airway Management Planned: Nasal ETT  Additional Equipment: None  Intra-op Plan:   Post-operative Plan: Extubation in OR  Informed Consent: I have reviewed the patients History and Physical, chart, labs and discussed the procedure including the risks, benefits and alternatives for the proposed anesthesia with the patient or authorized representative who has indicated his/her understanding and acceptance.     Dental advisory given  Plan Discussed with:   Anesthesia Plan Comments:        Anesthesia Quick Evaluation

## 2020-05-12 NOTE — Anesthesia Procedure Notes (Signed)
Procedure Name: Intubation Date/Time: 05/12/2020 10:25 AM Performed by: Maryella Shivers, CRNA Pre-anesthesia Checklist: Patient identified, Emergency Drugs available, Suction available and Patient being monitored Patient Re-evaluated:Patient Re-evaluated prior to induction Oxygen Delivery Method: Circle system utilized Preoxygenation: Pre-oxygenation with 100% oxygen Induction Type: IV induction Ventilation: Mask ventilation without difficulty Laryngoscope Size: Mac and 4 Grade View: Grade I Nasal Tubes: Nasal prep performed and Nasal Rae Tube size: 7.0 mm Placement Confirmation: ETT inserted through vocal cords under direct vision,  positive ETCO2 and breath sounds checked- equal and bilateral Secured at: 27 cm Tube secured with: Tape Dental Injury: Teeth and Oropharynx as per pre-operative assessment

## 2020-05-12 NOTE — H&P (Signed)
HISTORY AND PHYSICAL  [name] is a 59 y.o. male patient with CC: Painful teeth  No diagnosis found.  Past Medical History:  Diagnosis Date  . Allergic rhinitis 04/25/2012  . Asthma, chronic 10/21/2010   Followed in Pulmonary clinic/ [facility]/ [facility]   - HFA 50% 01/04/2011  > 90% 02/04/2011   - PFT's wnl 02/04/2011  - no intubation before  - has been hospitalized once for asthma in 2012 - quit smoking in 2012 - allergic to aspirin and pollen   10/11/2014>>Repeat pulmonary function tests. Performed for disability application FEV1/FVC=68.5%, FEV1 48%. Consistent with COPD, severe Please see results under media   . Chronic pain syndrome 02/03/2011   Involving the shoulder joint  05/20/2013: right shoulder x ray >>Degenerative changes AC joint Seen by PT by 11/2013 >> improvement 03/13/2014 D/c from PT due to orange card being expired and pt cancelling appt   . COPD (chronic obstructive pulmonary disease) (HCC)    exas 6/13, potentially due to ASA use.   Marland Kitchen GERD (gastroesophageal reflux disease)   . Hyperlipidemia   . Hypertension   . Obesity (BMI 30-39.9)   . Substance abuse (HCC)    cocaine 35 years ago    Current Facility-Administered Medications  Medication Dose Route Frequency Provider Last Rate Last Admin  . ceFAZolin (ANCEF) IVPB 2g/100 mL premix  2 g Intravenous On Call to OR Ocie Doyne, DDS      . lactated ringers infusion   Intravenous Continuous Achille Rich, MD       Allergies  Allergen Reactions  . Aspirin     SOB  . Nsaids    Active Problems:   * No active hospital problems. *  Vitals: Blood pressure (!) 167/91, pulse 76, temperature 98 F (36.7 C), temperature source Oral, resp. rate 18, height 5\' 11"  (1.803 m), weight 112.9 kg, SpO2 94 %. Lab results:No results found for this or any previous visit (from the past 24 hour(s)). Radiology Results: No results found. General appearance: alert, cooperative, no distress and mildly obese Head: Normocephalic, without  obvious abnormality, atraumatic Eyes: negative Nose: Nares normal. Septum midline. Mucosa normal. No drainage or sinus tenderness. Throat: Multiple carious teeth, residual roots. Pharynx clear.  Neck: no adenopathy and supple, symmetrical, trachea midline Resp: clear to auscultation bilaterally Cardio: regular rate and rhythm, S1, S2 normal, no murmur, click, rub or gallop  Assessment: Multiple non-restorable teeth secondary to dental caries, severe chronic periodontitis.  Plan: Multiple dental extractions with alveoloplasty. GA, Day surgery.   05/12/2020

## 2020-05-12 NOTE — Op Note (Signed)
05/12/2020  11:07 AM  PATIENT:  [name]  59 y.o. male  PRE-OPERATIVE DIAGNOSIS:  NON RESTORABLE TEETH # 4, 6, 7, 8, 11, 17, 20, 21, 22, 23, 24, 25, 26, 27, 28, 29, 31  POST-OPERATIVE DIAGNOSIS:  SAME + bilateral mandibular lingual tori  PROCEDURE:  Procedure(s): EXTRACTION TEETH # 4, 6, 7, 8, 11, 17, 20, 21, 22, 23, 24, 25, 26, 27, 28, 29, 31; ALVEOLOPLASTY RIGHT AND LEFT MAXILLA AND MANDIBLE, REMOVAL BILATERAL MANDIBULAR LINGUAL TORI  SURGEON:  Surgeon(s): Ocie Doyne, DDS  ANESTHESIA:   local and general  EBL:  minimal  DRAINS: none   SPECIMEN:  No Specimen  COUNTS:  YES  PLAN OF CARE: Discharge to home after PACU  PATIENT DISPOSITION:  PACU - hemodynamically stable.   PROCEDURE DETAILS: Dictation # [id number]  [name], DMD 05/12/2020 11:07 AM

## 2020-05-12 NOTE — Op Note (Signed)
[name], Ali MEDICAL RECORD [id number] ACCOUNT [id number] DATE OF BIRTH:[birth year] FACILITY: [facility] LOCATION: [facility] PHYSICIAN:Kenita Bines M. Demonta Wombles, DDS  OPERATIVE REPORT  DATE OF PROCEDURE:  05/12/2020  PREOPERATIVE DIAGNOSIS:  Nonrestorable teeth numbers 4, 6, 7, 8, 11, 17, 20, 21, 22, 23, 24, 25, 26, 27, 28, 29, 31, secondary to severe dental caries and periodontal disease.  POSTOPERATIVE DIAGNOSES:  Nonrestorable teeth numbers 4, 6, 7, 8, 11, 17, 20, 21, 22, 23, 24, 25, 26, 27, 28, 29, 31, secondary to severe dental caries and periodontal disease, bilateral mandibular lingual tori.  PROCEDURE:  Extraction of teeth numbers 4, 6, 7, 8, 11, 17, 20, 21, 22, 23, 24, 25, 26, 27, 28, 29, 31, alveoplasty right and left maxilla and mandible, removal of bilateral mandibular lingual tori.  SURGEON:  Ocie Doyne, DDS  ANESTHESIA:  General nasal intubation, Dr. Darlin Drop attending  DESCRIPTION OF PROCEDURE:  The patient was taken to the operating room and placed on the table in supine position.  General anesthesia was administered intravenously and a nasal endotracheal tube was placed and secured.  The eyes were protected and the  patient was draped for surgery.  A timeout was performed.  The posterior pharynx was suctioned and a throat pack was placed.  2% lidocaine 1:100,000 epinephrine was infiltrated and an inferior alveolar block on the right and left sides.  Buccal  infiltration was given in the anterior mandible and buccal and palatal infiltration was given in the maxilla.  A 15 blade was used to make an incision beginning at tooth number 17 carried forward to tooth number 20.  At this point, the incision was  created both in the buccal and lingual gingival sulcus around teeth numbers 20, 21, 22, 23, 24, 25 and 26.  The periosteum was reflected from around these teeth.  The teeth were elevated with a 301 elevator and then removed from the mouth with the dental  forceps.  The sockets were  curetted.  The tissue was trimmed.  The periosteum was reflected to expose the alveolar crest, which was irregular in contour and the lingual exposure was done.  There was noted to be a palatal torus present that would  interfere with denture seating so the decision was made to remove the lingual torus.  At this time, an egg bur and the Stryker handpiece under irrigation was used to reduce the lingual torus.  Then, alveoplasty was performed in the left mandible.  Then,  this area was irrigated and closed with 3-0 chromic.  The left maxilla was operated.  A 15 blade was used to make an incision around tooth number 11 and carried forward along the alveolar crest to teeth numbers 8, 7, 6 and continued across to tooth  number 4.  The periosteum was reflected from around these teeth.  The teeth were elevated and removed with the dental forceps.  The sockets were curetted.  The periosteum was reflected to expose the alveolar crest, which was irregular in contour owing to  several missing teeth.  Alveoplasty was then performed in the right and left maxilla using the egg bur under irrigation and the Stryker handpiece followed by the bone file.  Then, the area was irrigated and closed with 3-0 chromic.  The sweetheart  retractor and bite block were repositioned to the other side of the mouth.  Then, an incision was created around teeth number 31, carried forward anteriorly around teeth numbers 29, 28, 27 and then the tissue was reflected with a periosteal  elevator.   The teeth were elevated with a 301 elevator and removed from the mouth with the dental forceps.  Sockets were curetted.  The periosteum was reflected to expose the alveolar crest.  Another lingual torus was noted in the right mandible.  This was smoothed  using the Stryker handpiece with egg bur under irrigation.  Then, alveoplasty was performed in the right mandible.  The areas were further smoothed with a bone file then irrigated and closed with 3-0  chromic.  Then, the oral cavity was irrigated and  suctioned.  The areas were palpated to make sure a good contour was achieved.  Then, the throat pack was removed.  The patient was left in care of anesthesia for extubation with plans for discharge home after recovery room today.    ESTIMATED BLOOD LOSS:  Minimal.  COMPLICATIONS:  None.  SPECIMENS:  None.  CN/NUANCE  D:05/12/2020 T:05/12/2020 JOB:[id number]

## 2020-05-13 ENCOUNTER — Encounter (HOSPITAL_BASED_OUTPATIENT_CLINIC_OR_DEPARTMENT_OTHER): Payer: Self-pay | Admitting: Oral Surgery

## 2020-05-14 DIAGNOSIS — R32 Unspecified urinary incontinence: Secondary | ICD-10-CM | POA: Diagnosis not present

## 2020-05-22 DIAGNOSIS — J324 Chronic pansinusitis: Secondary | ICD-10-CM | POA: Diagnosis not present

## 2020-05-22 DIAGNOSIS — J338 Other polyp of sinus: Secondary | ICD-10-CM | POA: Diagnosis not present

## 2020-06-02 NOTE — Progress Notes (Signed)
Reviewed chart and patient's recent anesthesia  event at [facility] with Dr. Clemens Catholic. Pt is not an outpt surgery candidate. Heather at Dr. Avel Sensor office aware.

## 2020-06-05 ENCOUNTER — Other Ambulatory Visit (HOSPITAL_COMMUNITY): Payer: Medicaid Other

## 2020-06-17 DIAGNOSIS — I10 Essential (primary) hypertension: Secondary | ICD-10-CM | POA: Diagnosis not present

## 2020-06-17 DIAGNOSIS — R32 Unspecified urinary incontinence: Secondary | ICD-10-CM | POA: Diagnosis not present

## 2020-06-24 DIAGNOSIS — H5213 Myopia, bilateral: Secondary | ICD-10-CM | POA: Diagnosis not present

## 2020-07-03 ENCOUNTER — Other Ambulatory Visit (HOSPITAL_COMMUNITY): Payer: Self-pay | Admitting: Urology

## 2020-07-03 DIAGNOSIS — R3916 Straining to void: Secondary | ICD-10-CM | POA: Diagnosis not present

## 2020-07-03 DIAGNOSIS — N401 Enlarged prostate with lower urinary tract symptoms: Secondary | ICD-10-CM | POA: Diagnosis not present

## 2020-07-03 MED FILL — TAMSULOSIN HCL 0.4 MG CAP: 0.4 | 30 days supply | Qty: 30 | Fill #0

## 2020-07-09 DIAGNOSIS — I10 Essential (primary) hypertension: Secondary | ICD-10-CM | POA: Diagnosis not present

## 2020-07-09 DIAGNOSIS — R32 Unspecified urinary incontinence: Secondary | ICD-10-CM | POA: Diagnosis not present

## 2020-07-09 DIAGNOSIS — J449 Chronic obstructive pulmonary disease, unspecified: Secondary | ICD-10-CM | POA: Diagnosis not present

## 2020-07-13 ENCOUNTER — Ambulatory Visit: Admission: RE | Admit: 2020-07-13 | Payer: Medicaid Other | Source: Home / Self Care | Admitting: Otolaryngology

## 2020-07-13 ENCOUNTER — Encounter: Admission: RE | Payer: Self-pay | Source: Home / Self Care

## 2020-07-13 SURGERY — SURGERY, PARANASAL SINUS, ENDOSCOPIC, WITH NASAL SEPTOPLASTY, TURBINOPLASTY, AND MAXILLARY SINUSOTOMY
Anesthesia: General | Laterality: Bilateral

## 2020-07-29 ENCOUNTER — Other Ambulatory Visit: Payer: Self-pay | Admitting: Internal Medicine

## 2020-07-29 DIAGNOSIS — J4540 Moderate persistent asthma, uncomplicated: Secondary | ICD-10-CM

## 2020-08-01 DIAGNOSIS — R32 Unspecified urinary incontinence: Secondary | ICD-10-CM | POA: Diagnosis not present

## 2020-08-05 ENCOUNTER — Ambulatory Visit: Payer: Medicaid Other | Admitting: Student

## 2020-08-05 ENCOUNTER — Other Ambulatory Visit: Payer: Self-pay | Admitting: Student

## 2020-08-05 ENCOUNTER — Encounter: Payer: Self-pay | Admitting: Student

## 2020-08-05 ENCOUNTER — Other Ambulatory Visit: Payer: Self-pay

## 2020-08-05 VITALS — BP 144/75 | HR 76 | Temp 98.0°F | Ht 71.0 in | Wt 238.4 lb

## 2020-08-05 DIAGNOSIS — J441 Chronic obstructive pulmonary disease with (acute) exacerbation: Secondary | ICD-10-CM

## 2020-08-05 DIAGNOSIS — Z0000 Encounter for general adult medical examination without abnormal findings: Secondary | ICD-10-CM

## 2020-08-05 DIAGNOSIS — Z23 Encounter for immunization: Secondary | ICD-10-CM

## 2020-08-05 DIAGNOSIS — J449 Chronic obstructive pulmonary disease, unspecified: Secondary | ICD-10-CM

## 2020-08-05 DIAGNOSIS — J4540 Moderate persistent asthma, uncomplicated: Secondary | ICD-10-CM

## 2020-08-05 MED ORDER — ALBUTEROL SULFATE HFA 108 (90 BASE) MCG/ACT IN AERS: 2.0000 | INHALATION_SPRAY | Freq: Four times a day (QID) | RESPIRATORY_TRACT | 2 refills | Status: DC | PRN

## 2020-08-05 MED ORDER — ALBUTEROL SULFATE (2.5 MG/3ML) 0.083% IN NEBU
2.5000 mg | INHALATION_SOLUTION | Freq: Once | RESPIRATORY_TRACT | Status: AC
  Administered 2020-08-05: 2.5 mg via RESPIRATORY_TRACT

## 2020-08-05 MED ORDER — ALBUTEROL SULFATE (2.5 MG/3ML) 0.083% IN NEBU: INHALATION_SOLUTION | RESPIRATORY_TRACT | 4 refills | Status: DC

## 2020-08-05 MED FILL — TAMSULOSIN HCL 0.4 MG CAP: 0.4 | 30 days supply | Qty: 30 | Fill #0

## 2020-08-05 MED FILL — ALBUTEROL 0.083% INHAL SOLN: (2.5 MG/3ML | 6 days supply | Qty: 75 | Fill #0

## 2020-08-05 NOTE — Patient Instructions (Addendum)
Thank you, [name] for allowing [place] to provide your care today. Today we discussed the COPD and the medications for it.  We gave you albuterol treatment because of your wheezing.  We checked your oxygen level while sitting and after walking you were able to maintain an oxygen levels of 92%. We have also sent prescriptions for you to pick up albuterol inhaler and vials. We want you to come back to see [place] next week so we can continue to assess your lungs.  You received the flu and tetanus vaccine today.  I have ordered the following tests: Pulse ox while ambulating  Continue taking all your medications as prescribed.  Please follow-up in 1 week  Should you have any questions or concerns please call the internal medicine clinic at [phone].    Sharrell Ku, MD, MPH [facility]   My Chart Access: [email or web address]   If you have not already done so, please get your COVID 19 vaccine  To schedule an appointment for a COVID vaccine choice any of the following: Go to [email or web address]   Go to [email or web address]                  Call [phone]                                     Call [phone] and select Option 2

## 2020-08-05 NOTE — Progress Notes (Addendum)
   CC: Med refill/COPD follow up  HPI:  [name] is a 60 y.o. male with a past medical history as below who is here for medical refills and follow up of COPD.Please see problem based charting for evaluation, assessment and plan.  Past Medical History:  Diagnosis Date  . Allergic rhinitis 04/25/2012  . Asthma, chronic 10/21/2010   Followed in Pulmonary clinic/ [facility]/ [place]   - HFA 50% 01/04/2011  > 90% 02/04/2011   - PFT's wnl 02/04/2011  - no intubation before  - has been hospitalized once for asthma in 2012 - quit smoking in 2012 - allergic to aspirin and pollen   10/11/2014>>Repeat pulmonary function tests. Performed for disability application FEV1/FVC=68.5%, FEV1 48%. Consistent with COPD, severe Please see results under media   . Chronic pain syndrome 02/03/2011   Involving the shoulder joint  05/20/2013: right shoulder x ray >>Degenerative changes AC joint Seen by PT by 11/2013 >> improvement 03/13/2014 D/c from PT due to orange card being expired and pt cancelling appt   . COPD (chronic obstructive pulmonary disease) (HCC)    exas 6/13, potentially due to ASA use.   Marland Kitchen GERD (gastroesophageal reflux disease)   . Hyperlipidemia   . Hypertension   . Obesity (BMI 30-39.9)   . Substance abuse (HCC)    cocaine 35 years ago   Review of Systems:   Constitutional: Negative for fever, chills or weight changes HEENT: Negative for sore throat Respiratory: Positive for shortness of breath, wheezing and cough.  Positive for PND.  Cardiac: Negative for chest pain Extremities: Negative for leg swelling  Physical Exam: General: Pleasant elderly man. No acute distress. Neck: Supple.  No JVD.  Normal ROM Cardiac: RRR. No murmurs, rubs or gallops.  No LE edema. Respiratory: Mild increased work of breathing.  No accessory muscle use. Mild expiratory wheezes in the upper bases.  Mild bibasilar rales lower lung bases. Abdominal: Soft, symmetric and non tender.  Normal BS. Skin: Warm, dry and  intact without rashes or lesions Extremities: Atraumatic. Full ROM. Pulse palpable. Neuro: A&O x 3. Moves all extremities  Vitals:   08/05/20 1412  BP: (!) 144/75  Pulse: 76  Temp: 98 F (36.7 C)  TempSrc: Oral  SpO2: 90%  Weight: 238 lb 6.4 oz (108.1 kg)  Height: 5\' 11"  (1.803 m)    Assessment & Plan:   See Encounters Tab for problem based charting.  Patient discussed with Dr. , MD, MPH

## 2020-08-05 NOTE — Assessment & Plan Note (Addendum)
Patient presented today for refill of his albuterol inhaler and nebulizer solution. Patient states he has had more shortness of breath in the last few days. States he gets out of breath after walking just 10 feet. He has been waking up multiple times in the middle of the night due to shortness of breath and often has a coughing fit. He sleeps on one pillow and able to lay flat at home. He endorses chronic back pain. He denies any fever, chills, headaches, dizziness, chest pain or leg swelling. States he has received both doses of the [employer] vaccines.On exam, lungs are clear to auscultation bilaterally, there is mild rales in the lower bases and mild expiratory wheezes in the upper bases. There is mild increased work of breathing but no accessory muscle use. Gave patient an albuterol nebulized treatment in clinic with some improvement in shortness of breath. O2 sat at rest was 92%. Patient was ambulated and O2 sat remained at 92%. Albuterol inhaler and nebulizer refills were sent to [facility] outpatient pharmacy.   Of note, Pharmacy tech informed me that patient refilled his albuterol inhaler on 12/27 and has run out in less than 2 weeks so insurance will not cover the next prescription.  Out-of-pocket expense will be $34 and patien states he cannot afford this. Patient was informed to use his albuterol nebulizer at home and come back to clinic tomorrow so we could give him some funds to help cover his albuterol inhaler.  --Received flu vaccine today --Continue albuterol nebulizer prn  --Continue Dulera 2 puffs twice daily --Continue Spiriva 2 puffs daily --Pending refill for albuterol inhaler --Follow-up in 1 week --Encourage patient to get a booster at next visit --Clinic pharmacist referral sent for med rec and med education --Consider starting work-up for heart failure if symptoms are not improved while on the appropriate COPD therapy.  ADDENDUM Patient came back for cash fund from patient funds to help  with inhalers. Provided patient with $25, 2 bus passes and good Rx card. Resending albuterol inhaler, Spiriva and Dulera to [facility]. Patient received medication counseling from our clinic pharmacist, Cheral Almas.   Addendum to the addendum Called in prednisone 40 mg daily for 5 days for patient's COPD exacerbation to his local pharmacy [facility]).  Patient states he does not have the $3 for the medicine but will ask a neighbor for the money so he can pick up this prescription. --Start prednisone 40 mg daily for 5 days --Scheduled to follow-up next week (08/12/2020)

## 2020-08-05 NOTE — Assessment & Plan Note (Signed)
Patient received a flu vaccine today.  He also received a tetanus vaccine.  Patient due for colonoscopy. --Consider FIT test at next follow-up

## 2020-08-06 ENCOUNTER — Telehealth: Payer: Self-pay | Admitting: Pharmacist

## 2020-08-06 MED ORDER — ALBUTEROL SULFATE HFA 108 (90 BASE) MCG/ACT IN AERS: 2.0000 | INHALATION_SPRAY | Freq: Four times a day (QID) | RESPIRATORY_TRACT | 2 refills | Status: DC | PRN

## 2020-08-06 MED ORDER — SPIRIVA RESPIMAT 2.5 MCG/ACT IN AERS
2.0000 | INHALATION_SPRAY | Freq: Every day | RESPIRATORY_TRACT | 5 refills | Status: DC
Start: 2020-08-06 — End: 2020-12-23

## 2020-08-06 MED ORDER — DULERA 200-5 MCG/ACT IN AERO: 2.0000 | INHALATION_SPRAY | Freq: Two times a day (BID) | RESPIRATORY_TRACT | 0 refills | Status: DC

## 2020-08-06 NOTE — Telephone Encounter (Addendum)
Called patient to discuss inhaler use per Dr. Kirke Corin.   Patient went to pharmacy and was unable to pick up albuterol inhaler due to last pick-up on 07/27/20. Patient states he has been using albuterol inhaler 2 puffs by mouth as needed, typically in the morning, evening, and bedtime. Based on patient running out of albuterol inhaler 10 days from last pick-up, patient was averaging around 20 puffs/day. Discussed with patient this is above recommended amount he should be using this inhaler, and if he is finding he needs relief to help him breathe that regularly then he needs to call the clinic back.  Frequent use of albuterol inhaler is likely due to non-adherance to [facility] and Spiriva inhalers. Patient mentions he has been using the [facility] 2 puffs nightly and Spiriva 2 puffs nightly, but then later mentions he ran out of these inhalers. Patient is expecting to come in today to pick up money to pay for inhalers, and I plan to provide patient proper inhaler technique counseling at that time.

## 2020-08-06 NOTE — Telephone Encounter (Signed)
Thanks Rachelle, I am leaving the $25 on my desk in case I am seeing a patient when he gets here.

## 2020-08-06 NOTE — Addendum Note (Signed)
Addended bySharrell Ku on: 08/06/2020 04:14 PM   Modules accepted: Orders

## 2020-08-06 NOTE — Telephone Encounter (Signed)
Patient presented to clinic and we supplied him money to buy his three inhalers (Albuterol, Dulera, Spiriva).  Dr. Kirke Corin sent refills in for the medications to [facility] on [address] and patient is on his way to pick up medications.  I spent 20 minutes counseling patient on proper inhaler technique utilizing teach back method. Patient demonstrated understanding. I went over instructions for use of each inhaler and the side effects and benefits of each one. Discussed with patient he will not feel better in one night, but his maintenance inhalers, with proper and consistent use should help him feel better over time. Patient has follow-up appointment with Dr. Mcarthur Rossetti on 08/12/20 and inhaler technique and use should be assessed at that time.   Provided patient my direct number if he has any issues when he goes to pharmacy to pick up his medications.  Instructed patient to schedule appointment with Dr. Everardo All at his earliest convenience as note on 04/09/20 recommended 3 month follow-up.

## 2020-08-07 ENCOUNTER — Telehealth: Payer: Self-pay | Admitting: Pharmacist

## 2020-08-07 MED ORDER — PREDNISONE 20 MG PO TABS: 40.0000 mg | ORAL_TABLET | Freq: Every day | ORAL | 0 refills | Status: AC

## 2020-08-07 NOTE — Telephone Encounter (Signed)
I see. I hope he is able to get it. I added a short course of prednisone as well at the request of Dr. Heide Spark and he said he will ask a neighbor for $3 so he can pick that up when he picks up the [facility].

## 2020-08-07 NOTE — Addendum Note (Signed)
Addended bySharrell Ku on: 08/07/2020 12:18 PM   Modules accepted: Orders

## 2020-08-07 NOTE — Telephone Encounter (Signed)
Great. We'll see how he does with the prescriptions and education.

## 2020-08-07 NOTE — Telephone Encounter (Signed)
Patient called to let me know he was unable to pick-up his Dulera at [facility] yesterday as they were out of stock. [facility] ordered the medication and it should arrive today. Patient is waiting for them to call him when it is ready and then he will pick-up the medication. Instructed patient in the meantime to continue to take Spiriva and to call me if there is any further issues with picking up the [facility].  I will call patient on 08/11/20 to verify he was able to receive the medication.

## 2020-08-10 NOTE — Progress Notes (Signed)
Internal Medicine Clinic Attending  Case discussed with Dr. Amponsah  At the time of the visit.  We reviewed the resident's history and exam and pertinent patient test results.  I agree with the assessment, diagnosis, and plan of care documented in the resident's note.  

## 2020-08-11 ENCOUNTER — Telehealth: Payer: Self-pay | Admitting: Pharmacist

## 2020-08-11 NOTE — Telephone Encounter (Signed)
Thanks Rachelle. I hope he is able to get it soon.

## 2020-08-11 NOTE — Telephone Encounter (Signed)
I called and followed-up with Mr. Foister regarding our conversation about his Dulera inhaler last week.  [facility] did not have his Dulera inhaler in stock and was supposed to call the patient when it became available. As of today [facility] has yet to call the patient to pick up the medication. I called and spoke to the pharmacist at [facility] and he stated it is expected to arrive on the order tomorrow. I asked the pharmacist to call my direct number if the inhaler does not arrive tomorrow as I would like the patient to begin the inhaler sooner rather than later based on his history. I communicated this information with the patient.   Patient did state he was able to pick up his short course of prednisone and is feeling much better.  He has f/u appointment with Dr. Mcarthur Rossetti tomorrow.

## 2020-08-12 ENCOUNTER — Ambulatory Visit (INDEPENDENT_AMBULATORY_CARE_PROVIDER_SITE_OTHER): Payer: Medicaid Other | Admitting: Internal Medicine

## 2020-08-12 ENCOUNTER — Encounter: Payer: Self-pay | Admitting: Internal Medicine

## 2020-08-12 ENCOUNTER — Other Ambulatory Visit: Payer: Self-pay

## 2020-08-12 DIAGNOSIS — J449 Chronic obstructive pulmonary disease, unspecified: Secondary | ICD-10-CM

## 2020-08-12 MED ORDER — BUDESONIDE-FORMOTEROL FUMARATE 160-4.5 MCG/ACT IN AERO: 2.0000 | INHALATION_SPRAY | Freq: Two times a day (BID) | RESPIRATORY_TRACT | 12 refills | Status: DC

## 2020-08-12 NOTE — Telephone Encounter (Signed)
Thank you [name], I appreciate your assistance with [name]'s medications

## 2020-08-12 NOTE — Telephone Encounter (Signed)
Dr. Mcarthur Rossetti,  Could you send in a rx for Symbicort 160 rather than Dulera. Pharmacy is unable to get [facility] in right now and I don't want the patient continuing with his maintenance inhaler. They have symbicort in stock and once you send it in I can reach out to patient to provide him this information and answer any questions.  Thanks! Zamiyah Resendes

## 2020-08-12 NOTE — Progress Notes (Signed)
SATURATION QUALIFICATIONS: (This note is used to comply with regulatory documentation for home oxygen)  Patient Saturations on Room Air at Rest = 94%  Patient Saturations on Room Air while Ambulating = 88%  

## 2020-08-12 NOTE — Patient Instructions (Addendum)
[name],  It was a pleasure seeing you in clinic. Today we discussed:   COPD:  I am glad that you are feeling better today. Please complete the course. Please get your Elwin Sleight at your earliest convenience from [place].   If you have worsening shortness of breath, please contact us.   If you have any questions or concerns, please call our clinic at [phone] between 9am-5pm and after hours call [phone] and ask for the internal medicine resident on call. If you feel you are having a medical emergency please call 911.   Thank you, we look forward to helping you remain healthy!

## 2020-08-12 NOTE — Telephone Encounter (Signed)
Hi [name],  I sent in the Rx for Symbicort 160. Patient endorsed needing $1.60 to cover medication cost at this visit which was given to him. He also endorsed that he probably will not be able to pick up his Rx until 1-2 days. Just FYI.  Thank you for your help!

## 2020-08-13 NOTE — Telephone Encounter (Signed)
No worries, thank you for your help!

## 2020-08-13 NOTE — Telephone Encounter (Signed)
[name] called to let me know he picked up his Symbicort inhaler. We reviewed how to properly use the inhaler. Patient states he has used this inhaler before and had no previous issues. He had no questions today regarding the medication.

## 2020-08-13 NOTE — Telephone Encounter (Signed)
I just re-read my message and realize I meant to say I didn't want him continuing withOUT his maintenance inhaler. Thanks so much Dr. Mcarthur Rossetti! I will call him either tomorrow or Monday to see if he was able to pick it up.

## 2020-08-14 NOTE — Assessment & Plan Note (Addendum)
Mr [name] presents for follow up of his COPD exacerbation from 08/07/2020 at which time he was prescribed Dulera, Spiriva, albuterol nebulizer, and prednisone. He notes that he has been taking prednisone for two days and feels better. He still notes having to rest frequently while walking but does note that he is able to walk with his dog for approximately 1 mile. He denies any productive cough at this time. He notes that he was unable to get his Elwin Sleight due to supply shortage at the pharmacy but has been using Spiriva daily and his albuterol nebulizer.  On examination, patient noted to have mild end-expiratory wheezing in upper lung fields. O2 sats 94% at rest, dropped to ~88% with exertion but improved to 98% with rest.  Patient encouraged to adhere to his current medication regimen for optimized COPD control but did discuss possible progression towards supplemental oxygen for which patient expresses understanding.  Plan: - Continue Spiriva 2 puffs daily - Will switch to Symbicort 160 2 puff bid as Elwin Sleight is unavailable at his pharmacy at this time  - Continue albuterol nebulizer and inhaler prn - Encouraged to continue prednisone as prescribed - F/u in 4 weeks

## 2020-08-14 NOTE — Progress Notes (Signed)
   CC: f/u COPD  HPI:  [name] is a 60 y.o. male with PMHx as listed below presenting for follow up of his COPD. No acute concerns at this time. Please see problem based charting for complete assessment and plan.  Past Medical History:  Diagnosis Date  . Allergic rhinitis 04/25/2012  . Asthma, chronic 10/21/2010   [facility] [place]   - HFA 50% 01/04/2011  > 90% 02/04/2011   - PFT's wnl 02/04/2011  - no intubation before  - has been hospitalized once for asthma in 2012 - quit smoking in 2012 - allergic to aspirin and pollen   10/11/2014>>Repeat pulmonary function tests. Performed for disability application FEV1/FVC=68.5%, FEV1 48%. Consistent with COPD, severe Please see results under media   . Chronic pain syndrome 02/03/2011   Involving the shoulder joint  05/20/2013: right shoulder x ray >>Degenerative changes AC joint Seen by PT by 11/2013 >> improvement 03/13/2014 D/c from PT due to orange card being expired and pt cancelling appt   . COPD (chronic obstructive pulmonary disease) (HCC)    [place] 6/13, potentially due to ASA use.   Marland Kitchen GERD (gastroesophageal reflux disease)   . Hyperlipidemia   . Hypertension   . Obesity (BMI 30-39.9)   . Substance abuse (HCC)    cocaine 35 years ago   Review of Systems:  Negative except as stated in HPI.  Physical Exam:  Vitals:   08/12/20 1434 08/12/20 1442 08/12/20 1443 08/12/20 1444  BP:      Pulse:      Resp:  (!) 91 (!) 88   Temp:      TempSrc:      SpO2: 92%   98%  Weight:      Height:       Physical Exam  Constitutional: Elderly male, appears well-developed and well-nourished. No distress.  Cardiovascular: Normal rate, regular rhythm, S1 and S2 present, no murmurs, rubs, gallops.  Distal pulses intact Respiratory: No respiratory distress, no accessory muscle use.  Effort is normal.  Mild expiratory wheezing in upper lung fields Musculoskeletal: Normal bulk and tone.  No peripheral edema  noted. Neurological: Is alert and oriented x4, no apparent focal deficits noted. Skin: Warm and dry.  No rash, erythema, lesions noted.  Assessment & Plan:   See Encounters Tab for problem based charting.  Patient discussed with Dr. Antony Contras

## 2020-08-19 ENCOUNTER — Other Ambulatory Visit: Payer: Self-pay | Admitting: Internal Medicine

## 2020-08-19 DIAGNOSIS — J4489 Other specified chronic obstructive pulmonary disease: Secondary | ICD-10-CM

## 2020-08-19 DIAGNOSIS — J449 Chronic obstructive pulmonary disease, unspecified: Secondary | ICD-10-CM

## 2020-08-19 NOTE — Progress Notes (Signed)
Internal Medicine Clinic Attending ° °Case discussed with Dr. Aslam  At the time of the visit.  We reviewed the resident’s history and exam and pertinent patient test results.  I agree with the assessment, diagnosis, and plan of care documented in the resident’s note.  °

## 2020-08-20 DIAGNOSIS — H5203 Hypermetropia, bilateral: Secondary | ICD-10-CM | POA: Diagnosis not present

## 2020-09-03 DIAGNOSIS — R32 Unspecified urinary incontinence: Secondary | ICD-10-CM | POA: Diagnosis not present

## 2020-09-29 DIAGNOSIS — R32 Unspecified urinary incontinence: Secondary | ICD-10-CM | POA: Diagnosis not present

## 2020-10-06 ENCOUNTER — Other Ambulatory Visit: Payer: Self-pay

## 2020-10-06 ENCOUNTER — Ambulatory Visit (INDEPENDENT_AMBULATORY_CARE_PROVIDER_SITE_OTHER): Payer: Medicaid Other | Admitting: Pulmonary Disease

## 2020-10-06 ENCOUNTER — Encounter: Payer: Self-pay | Admitting: Pulmonary Disease

## 2020-10-06 VITALS — BP 142/84 | HR 70 | Temp 98.6°F | Ht 71.0 in | Wt 243.0 lb

## 2020-10-06 DIAGNOSIS — Z5181 Encounter for therapeutic drug level monitoring: Secondary | ICD-10-CM | POA: Diagnosis not present

## 2020-10-06 DIAGNOSIS — J449 Chronic obstructive pulmonary disease, unspecified: Secondary | ICD-10-CM | POA: Diagnosis not present

## 2020-10-06 MED ORDER — AZITHROMYCIN 250 MG PO TABS: 250.0000 mg | ORAL_TABLET | Freq: Every day | ORAL | 4 refills | Status: DC

## 2020-10-06 NOTE — Patient Instructions (Signed)
COPD-asthma overlap --CONTINUE Symbicort 160-4.5 TWO puffs TWICE daily --CONTINUE Spiriva 2.5 mcg TWO puffs ONCE a day --START azithromycin 250 mg daily for three months  Follow-up with me in 3 months

## 2020-10-06 NOTE — Progress Notes (Signed)
Subjective:   PATIENT ID: [name] GENDER: male DOB: 01/12/1961, MRN: [id number]   HPI  Chief Complaint  Patient presents with   Follow-up    No complaints    Reason for Visit: Follow-up for asthma  [name] is a 60 year old male former smoker with asthma/COPD who presents for follow-up.  He reports his symptoms are currently well-controlled at this time. He has had at least 3 outpatient COPD exacerbations including last month he required steroids and antibiotics. This happens when he runs out of medications. Otherwise he is compliant with his Symbicort and Spiriva and now able to afford the inhalers. He still does have shortness of breath and wheezing with exertion. He does have a daily productive cough that is sometimes clear/light yellow. Denies fevers and chills. His symptoms limit his activity and he does feel fatigued.  Social History: Former smoker  1ppd x 40 yrs. Quit in 2011  Environmental exposures:  Chef x 18 years  I have personally reviewed patient's past medical/family/social history/allergies/current medications.  Past Medical History:  Diagnosis Date   Allergic rhinitis 04/25/2012   Asthma, chronic 10/21/2010   Followed in Pulmonary clinic/  [facility]/ [facility]   - HFA 50% 01/04/2011  > 90% 02/04/2011   - PFT's wnl 02/04/2011  - no intubation before  - has been hospitalized once for asthma in 2012 - quit smoking in 2012 - allergic to aspirin and pollen   10/11/2014>>Repeat pulmonary function tests. Performed for disability application FEV1/FVC=68.5%, FEV1 48%. Consistent with COPD, severe Please see results under media    Chronic pain syndrome 02/03/2011   Involving the shoulder joint  05/20/2013: right shoulder x ray >>Degenerative changes AC joint Seen by PT by 11/2013 >> improvement 03/13/2014 D/c from PT due to orange card being expired and pt cancelling appt    COPD (chronic obstructive pulmonary disease) (HCC)    [place] 6/13, potentially due to ASA use.     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  Tobacco Use   Smoking status: Former Smoker    Packs/day: 1.00    Years: 15.00    Pack years: 15.00    Types: Cigarettes    Quit date: 10/30/2009    Years since quitting: 10.9   Smokeless tobacco: Never Used  Substance and Sexual Activity   Alcohol use: Not on file    Comment: Beer on [employer]   Drug use: No    Comment: former cocaine use 35 years ago   Sexual activity: Not on file    Comment: unemployed, was a Investment banker, operational    Allergies  Allergen Reactions   Aspirin     SOB   Nsaids      Outpatient Medications Prior to Visit  Medication Sig Dispense Refill   albuterol (PROVENTIL) (2.5 MG/3ML) 0.083% nebulizer solution USE 1 VIAL VIA NEBULIZER EVERY 6 HOURS AS NEEDED FOR WHEEZING OR SHORTNESS OF BREATH 75 mL 4   amLODipine (NORVASC) 10 MG tablet Take 1 tablet (10 mg total) by mouth daily. 90 tablet 1  amoxicillin (AMOXIL) 500 MG capsule Take 1 capsule (500 mg total) by mouth 3 (three) times daily. 21 capsule 0   atorvastatin (LIPITOR) 40 MG tablet Take 1 tablet (40 mg total) by mouth daily. 90 tablet 3   budesonide-formoterol (SYMBICORT) 160-4.5 MCG/ACT inhaler Inhale 2 puffs into the lungs 2 (two) times daily. 1 each 12   cetirizine (ZYRTEC) 10 MG tablet Take 1 tablet (10 mg total) by mouth daily. 90 tablet 1   fluticasone (FLONASE) 50 MCG/ACT nasal spray Place 1 spray into both nostrils daily. 1 g 0   losartan (COZAAR) 100 MG tablet Take 1 tablet (100 mg total) by mouth daily. 90 tablet 3   oxyCODONE-acetaminophen (PERCOCET) 5-325 MG tablet Take 1 tablet by mouth every 4 (four)  hours as needed. 30 tablet 0   pregabalin (LYRICA) 75 MG capsule Take 1 capsule (75 mg total) by mouth 2 (two) times daily. 180 capsule 2   PROAIR HFA 108 (90 Base) MCG/ACT inhaler INHALE 2 PUFFS INTO THE LUNGS EVERY 6 HOURS AS NEEDED FOR WHEEZING OR SHORTNESS OF BREATH 8.5 g 1   tamsulosin (FLOMAX) 0.4 MG CAPS capsule TAKE 1 CAPSULE(0.4 MG) BY MOUTH DAILY 30 capsule 1   Tiotropium Bromide Monohydrate (SPIRIVA RESPIMAT) 2.5 MCG/ACT AERS Inhale 2 puffs into the lungs daily. 2 g 5   No facility-administered medications prior to visit.    Review of Systems  Constitutional: Positive for malaise/fatigue. Negative for chills, diaphoresis, fever and weight loss.  HENT: Negative for congestion, ear pain and sore throat.   Respiratory: Positive for cough, sputum production, shortness of breath and wheezing. Negative for hemoptysis.   Cardiovascular: Negative for chest pain, palpitations and leg swelling.  Gastrointestinal: Negative for abdominal pain, heartburn and nausea.  Genitourinary: Negative for frequency.  Musculoskeletal: Negative for joint pain and myalgias.  Skin: Negative for itching and rash.  Neurological: Negative for dizziness, weakness and headaches.  Endo/Heme/Allergies: Does not bruise/bleed easily.  Psychiatric/Behavioral: Negative for depression. The patient is not nervous/anxious.      Objective:   Vitals:   10/06/20 1619  BP: (!) 142/84  Pulse: 70  Temp: 98.6 F (37 C)  SpO2: 95%  Weight: 243 lb (110.2 kg)  Height: 5\' 11"  (1.803 m)       Physical Exam: General: Well-appearing, no acute distress HENT: [place], AT Eyes: EOMI, no scleral icterus Respiratory: Diminished breath sounds bilaterally.  Occasional expiratory wheeze Cardiovascular: RRR, -M/R/G, no JVD Extremities:-Edema,-tenderness Neuro: AAO x4, CNII-XII grossly intact Psych: Normal mood, normal affect  Data Reviewed:  Imaging: CTA 09/25/10 - No PE. Bilateral peribronchial thickening, mucoid  impaction. Reactive hilar and mediastinal lymphadenopathy. CXR 02/12/20 - Right middle lobe atelectasis  PFT: 02/04/11 FVC 4.11 (80%) FEV1 3.25 (87%) Ratio 80  TLC 83% DLCO 111% Interpretation: Normal PFTs. No significant bronchodilator response  Labs: CBC    Component Value Date/Time   WBC 7.1 02/12/2020 1007   WBC 10.8 (H) 03/28/2012 1415   RBC 5.39 02/12/2020 1007   RBC 5.02 03/28/2012 1415   HGB 15.6 02/12/2020 1007   HCT 49.0 02/12/2020 1007   PLT 246 02/12/2020 1007   MCV 91 02/12/2020 1007   MCH 28.9 02/12/2020 1007   MCH 30.1 03/28/2012 1415   MCHC 31.8 02/12/2020 1007   MCHC 33.0 03/28/2012 1415   RDW 14.2 02/12/2020 1007   LYMPHSABS 2.7 02/12/2020 1007   MONOABS 0.9 03/28/2012 1415   EOSABS 0.8 (H) 02/12/2020 1007   BASOSABS 0.0 02/12/2020 1007   Absolute eos 02/12/20  800  Imaging, labs and test noted above have been reviewed independently by me.    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 Not in exacerbation now. Compliant with his medications at this time however continues to have symptoms of bronchitis. We discussed starting chronic macrolide therapy to reduce exacerbations and potentially improve symptoms. If he continues to have symptoms on current therapy, consider biologic therapy for asthma management.  COPD-asthma overlap - uncontrolled. Not in active exacerbation --CONTINUE Symbicort 160-4.5 TWO puffs TWICE daily --CONTINUE Spiriva 2.5 mcg TWO puffs ONCE a day --START azithromycin 250 mg daily for three months  Health Maintenance Immunization History  Administered Date(s) Administered   Influenza Split 04/22/2011, 05/10/2019   Influenza,inj,Quad PF,6+ Mos 05/20/2013, 05/21/2014, 05/14/2018, 08/05/2020   Pneumococcal Polysaccharide-23 04/22/2011   Tdap 08/05/2020   CT Lung Screen - discuss at next visit. Meets  criteria  Orders Placed This Encounter  Procedures   EKG 12-Lead   Meds ordered this encounter  Medications   azithromycin (ZITHROMAX) 250 MG tablet    Sig: Take 1 tablet (250 mg total) by mouth daily.    Dispense:  30 tablet    Refill:  4    Return in about 3 months (around 01/06/2021).   Anthonette Lesage Mechele Collin, MD [facility] 10/06/2020 10:31 AM  Office Number [phone]

## 2020-10-07 ENCOUNTER — Encounter: Payer: Self-pay | Admitting: Pulmonary Disease

## 2020-10-30 DIAGNOSIS — R32 Unspecified urinary incontinence: Secondary | ICD-10-CM | POA: Diagnosis not present

## 2020-11-25 ENCOUNTER — Other Ambulatory Visit: Payer: Self-pay

## 2020-11-25 DIAGNOSIS — I10 Essential (primary) hypertension: Secondary | ICD-10-CM

## 2020-11-25 MED ORDER — LOSARTAN POTASSIUM 100 MG PO TABS: 100.0000 mg | ORAL_TABLET | Freq: Every day | ORAL | 3 refills | Status: DC

## 2020-11-25 NOTE — Telephone Encounter (Signed)
RTC, patient states he went to pick up his RX for Losartan and it was $89.  The pharmacist told him it was because the prescribing MD was not medicaid approved (Dr. Lyn Hollingshead).  Will send to yellow team to resend RX, patient states he is out of his Losartan and wants to pick this up today. Thank you, SChaplin, RN,BSN

## 2020-11-25 NOTE — Telephone Encounter (Signed)
Pls contact pt [phone] regarding medicine

## 2020-11-30 ENCOUNTER — Other Ambulatory Visit (HOSPITAL_COMMUNITY): Payer: Self-pay

## 2020-11-30 ENCOUNTER — Other Ambulatory Visit: Payer: Self-pay

## 2020-11-30 DIAGNOSIS — J4540 Moderate persistent asthma, uncomplicated: Secondary | ICD-10-CM

## 2020-11-30 DIAGNOSIS — G8929 Other chronic pain: Secondary | ICD-10-CM

## 2020-11-30 MED ORDER — ALBUTEROL SULFATE (2.5 MG/3ML) 0.083% IN NEBU: INHALATION_SOLUTION | RESPIRATORY_TRACT | 4 refills | Status: DC

## 2020-11-30 MED ORDER — PREGABALIN 75 MG PO CAPS: 75.0000 mg | ORAL_CAPSULE | Freq: Two times a day (BID) | ORAL | 2 refills | Status: DC

## 2020-11-30 MED FILL — Albuterol Sulfate Soln Nebu 0.083% (2.5 MG/3ML): RESPIRATORY_TRACT | 18 days supply | Qty: 75 | Fill #0 | Status: CN

## 2020-11-30 NOTE — Telephone Encounter (Signed)
albuterol (PROVENTIL) (2.5 MG/3ML) 0.083% nebulizer solution  pregabalin (LYRICA) 75 MG capsule, refill request @  [id number] [place], [place] - [address] AT [address] Phone:  [phone]  Fax:  [phone]

## 2020-11-30 NOTE — Telephone Encounter (Signed)
Pt was called / informed of refills. 

## 2020-11-30 NOTE — Telephone Encounter (Signed)
Call transfer from office - stated pt sounds sob. Pt requesting a refill on Lyrica and albuterol neb solution. Informed pt he has 1 refill at [facility] pharmacy but stated wants rx sent to [facility] which is closer to his home. I asked about his breathing; stated he was just outside w/o a mask and the pollen is causing the sob.

## 2020-11-30 NOTE — Telephone Encounter (Signed)
Call from pt-missed a call from RN Pt informed of refills-[facility] confirmed pharmacy Pt wanted to make sure prescribing MD was medicaid approved Call to pharmacy-medication went through with no problems. No further action needed, phone call complete.Kingsley Spittle Cassady5/2/20223:31 PM

## 2020-12-01 DIAGNOSIS — R32 Unspecified urinary incontinence: Secondary | ICD-10-CM | POA: Diagnosis not present

## 2020-12-22 ENCOUNTER — Other Ambulatory Visit: Payer: Self-pay | Admitting: Internal Medicine

## 2020-12-22 DIAGNOSIS — J449 Chronic obstructive pulmonary disease, unspecified: Secondary | ICD-10-CM

## 2020-12-23 ENCOUNTER — Encounter: Payer: Self-pay | Admitting: Student

## 2020-12-23 ENCOUNTER — Other Ambulatory Visit: Payer: Self-pay

## 2020-12-23 ENCOUNTER — Ambulatory Visit (INDEPENDENT_AMBULATORY_CARE_PROVIDER_SITE_OTHER): Payer: Medicaid Other | Admitting: Student

## 2020-12-23 VITALS — BP 141/73 | HR 71 | Temp 97.9°F | Ht 71.0 in | Wt 241.3 lb

## 2020-12-23 DIAGNOSIS — J4540 Moderate persistent asthma, uncomplicated: Secondary | ICD-10-CM | POA: Diagnosis not present

## 2020-12-23 DIAGNOSIS — J441 Chronic obstructive pulmonary disease with (acute) exacerbation: Secondary | ICD-10-CM

## 2020-12-23 MED ORDER — IPRATROPIUM-ALBUTEROL 0.5-2.5 (3) MG/3ML IN SOLN
3.0000 mL | Freq: Once | RESPIRATORY_TRACT | Status: AC
  Administered 2020-12-23: 3 mL via RESPIRATORY_TRACT

## 2020-12-23 MED ORDER — BUDESONIDE-FORMOTEROL FUMARATE 160-4.5 MCG/ACT IN AERO
2.0000 | INHALATION_SPRAY | Freq: Two times a day (BID) | RESPIRATORY_TRACT | 12 refills | Status: DC
Start: 2020-12-23 — End: 2021-01-25

## 2020-12-23 MED ORDER — PREDNISONE 20 MG PO TABS: 40.0000 mg | ORAL_TABLET | Freq: Every day | ORAL | 0 refills | Status: AC

## 2020-12-23 MED ORDER — SPIRIVA RESPIMAT 2.5 MCG/ACT IN AERS
2.0000 | INHALATION_SPRAY | Freq: Every day | RESPIRATORY_TRACT | 5 refills | Status: DC
Start: 2020-12-23 — End: 2021-01-25

## 2020-12-23 NOTE — Patient Instructions (Signed)
Thank you, [name] for allowing [place] to provide your care today. Today we discussed   COPD exacerbation We will give you a breathing treatment while you are here and give you a 5-day course of prednisone.  Please continue your home medications and home inhalers.  Please continue on your azithromycin as prescribed by your pulmonary doctor.  I would recommend calling them trying to see them before the end of the week.  You are already scheduled to see them early next month. If you feel as though your breathing is not improving or you are getting worse please call our clinic.  If you feel like you are having a medical emergency please go to the closest emergency room or call 911.  I have ordered the following labs for you:  Lab Orders  No laboratory test(s) ordered today      Referrals ordered today:   Referral Orders  No referral(s) requested today     I have ordered the following medication/changed the following medications:   Stop the following medications: Medications Discontinued During This Encounter  Medication Reason  . Tiotropium Bromide Monohydrate (SPIRIVA RESPIMAT) 2.5 MCG/ACT AERS Reorder  . budesonide-formoterol (SYMBICORT) 160-4.5 MCG/ACT inhaler Reorder     Start the following medications: Meds ordered this encounter  Medications  . Tiotropium Bromide Monohydrate (SPIRIVA RESPIMAT) 2.5 MCG/ACT AERS    Sig: Inhale 2 puffs into the lungs daily.    Dispense:  2 g    Refill:  5  . budesonide-formoterol (SYMBICORT) 160-4.5 MCG/ACT inhaler    Sig: Inhale 2 puffs into the lungs 2 (two) times daily.    Dispense:  1 each    Refill:  12  . predniSONE (DELTASONE) 20 MG tablet    Sig: Take 2 tablets (40 mg total) by mouth daily with breakfast for 5 days.    Dispense:  10 tablet    Refill:  0  . ipratropium-albuterol (DUONEB) 0.5-2.5 (3) MG/3ML nebulizer solution 3 mL     Follow up: 1 month for follow-up for your COPD or sooner if needed.   Should you have any  questions or concerns please call the internal medicine clinic at [phone].     Thalia Bloodgood, D.O. [facility]

## 2020-12-23 NOTE — Assessment & Plan Note (Signed)
Assessment: Patient presented clinic with worsening cough with increased sputum production as well as shortness of breath for the past few days.  Notes that he was outside more frequently and exposed to increased months of pollen.  He notes that pollen has caused dissipation of his COPD and asthma in the past.  He denies any systemic symptoms of fevers or chills.   Physical exam is consistent with decreased breath sounds throughout as well as diffuse wheezing.  Suspect patient is having COPD exacerbation.  Gave patient DuoNeb treatment while in clinic and afterwards, pulmonary exam is much improved with no more wheezing.  Patient stated he felt better at this time.  Patient appears to be on 23-month azithromycin and states that he has been taking this.  Also encourage patient to continue Symbicort as well as albuterol and Spiriva inhalers.  We will write patient for 5-day course of 40 mg prednisone.  Patient has follow-up appointment with his pulmonologist coming up, recommended calling and scheduling a sooner appointment.  Patient instructed to go to closest ED if feels as though symptoms are worsening.   Plan: -DuoNeb while in clinic -Continue home inhalers, prednisone 40 mg for 5 days -Follow-up with his pulmonologist

## 2020-12-23 NOTE — Progress Notes (Signed)
CC: Worsening shortness of breath,increased cough  HPI:  [name] is a 60 y.o. male with a past medical history stated below and presents today for increased cough sputum production, shortness of breath.  Please see problem based assessment and plan for additional details.  Past Medical History:  Diagnosis Date  . Allergic rhinitis 04/25/2012  . Asthma, chronic 10/21/2010   Followed in Pulmonary clinic/ [facility]/ [facility]   - HFA 50% 01/04/2011  > 90% 02/04/2011   - PFT's wnl 02/04/2011  - no intubation before  - has been hospitalized once for asthma in 2012 - quit smoking in 2012 - allergic to aspirin and pollen   10/11/2014>>Repeat pulmonary function tests. Performed for disability application FEV1/FVC=68.5%, FEV1 48%. Consistent with COPD, severe Please see results under media   . Chronic pain syndrome 02/03/2011   Involving the shoulder joint  05/20/2013: right shoulder x ray >>Degenerative changes AC joint Seen by PT by 11/2013 >> improvement 03/13/2014 D/c from PT due to orange card being expired and pt cancelling appt   . COPD (chronic obstructive pulmonary disease) (HCC)    [place] 6/13, potentially due to ASA use.   Marland Kitchen GERD (gastroesophageal reflux disease)   . Hyperlipidemia   . Hypertension   . Obesity (BMI 30-39.9)   . Substance abuse (HCC)    cocaine 35 years ago    Current Outpatient Medications on File Prior to Visit  Medication Sig Dispense Refill  . albuterol (PROVENTIL) (2.5 MG/3ML) 0.083% nebulizer solution USE 1 VIAL VIA NEBULIZER EVERY 6 HOURS AS NEEDED FOR WHEEZING OR SHORTNESS OF BREATH 75 mL 4  . amLODipine (NORVASC) 10 MG tablet Take 1 tablet (10 mg total) by mouth daily. 90 tablet 1  . amoxicillin (AMOXIL) 500 MG capsule TAKE 1 CAPSULE (500 MG TOTAL) BY MOUTH 3 (THREE) TIMES DAILY. 21 capsule 0  . atorvastatin (LIPITOR) 40 MG tablet Take 1 tablet (40 mg total) by mouth daily. 90 tablet 3  . azithromycin (ZITHROMAX) 250 MG tablet Take 1 tablet (250 mg total) by  mouth daily. 30 tablet 4  . cetirizine (ZYRTEC) 10 MG tablet Take 1 tablet (10 mg total) by mouth daily. 90 tablet 1  . fluticasone (FLONASE) 50 MCG/ACT nasal spray Place 1 spray into both nostrils daily. 1 g 0  . losartan (COZAAR) 100 MG tablet Take 1 tablet (100 mg total) by mouth daily. 90 tablet 3  . pregabalin (LYRICA) 75 MG capsule Take 1 capsule (75 mg total) by mouth 2 (two) times daily. 180 capsule 2  . PROAIR HFA 108 (90 Base) MCG/ACT inhaler INHALE 2 PUFFS INTO THE LUNGS EVERY 6 HOURS AS NEEDED FOR WHEEZING OR SHORTNESS OF BREATH 8.5 g 1  . tamsulosin (FLOMAX) 0.4 MG CAPS capsule TAKE 1 CAPSULE(0.4 MG) BY MOUTH DAILY 30 capsule 1  . tamsulosin (FLOMAX) 0.4 MG CAPS capsule TAKE 1 CAPSULE BY MOUTH EVERY DAY 30 capsule 11   No current facility-administered medications on file prior to visit.    Family History  Problem Relation Age of Onset  . Pancreatic cancer Mother   . Alcohol abuse Father   . Sarcoidosis Brother     Social History   Socioeconomic History  . Marital status: Single    Spouse name: Not on file  . Number of children: Not on file  . Years of education: Not on file  . Highest education level: Not on file  Occupational History  . Occupation: Unemployed    Comment: worked as a  cook  Tobacco Use  . Smoking status: Former Smoker    Packs/day: 1.00    Years: 40.00    Pack years: 40.00    Types: Cigarettes    Quit date: 10/30/2009    Years since quitting: 11.1  . Smokeless tobacco: Never Used  Substance and Sexual Activity  . Alcohol use: Not on file    Comment: Beer on wkends  . Drug use: No    Comment: former cocaine use 35 years ago  . Sexual activity: Not on file    Comment: unemployed, was a Investment banker, operational  Other Topics Concern  . Not on file  Social History Narrative  . Not on file   Social Determinants of Health   Financial Resource Strain: Not on file  Food Insecurity: Not on file  Transportation Needs: Not on file  Physical Activity: Not on file   Stress: Not on file  Social Connections: Not on file  Intimate Partner Violence: Not on file    Review of Systems: ROS negative except for what is noted on the assessment and plan.  Vitals:   12/23/20 1356 12/23/20 1404  BP: (!) 148/80 (!) 141/73  Pulse: 71 71  Temp: 97.9 F (36.6 C)   TempSrc: Oral   SpO2: 95%   Weight: 241 lb 4.8 oz (109.5 kg)   Height: 5\' 11"  (1.803 m)      Physical Exam: Constitutional: Ill-appearing, no acute distress HENT: normocephalic atraumatic Eyes: conjunctiva non-erythematous Neck: supple Cardiovascular: regular rate and rhythm, no m/r/g Pulmonary/Chest: Normal work of breathing on room air, diffuse wheezing and decreased breath sounds throughout MSK: normal bulk and tone Neurological: alert & oriented x 3 Skin: warm and dry Psych: Normal mood and thought process   Assessment & Plan:   See Encounters Tab for problem based charting.  Patient discussed with Dr. , D.O. [facility], PGY-1 Pager: [phone], Phone: [phone] Date 12/23/2020 Time 7:22 PM

## 2020-12-29 ENCOUNTER — Telehealth: Payer: Self-pay | Admitting: *Deleted

## 2020-12-29 NOTE — Progress Notes (Signed)
Internal Medicine Clinic Attending  Case discussed with Dr. Katsadouros  At the time of the visit.  We reviewed the resident's history and exam and pertinent patient test results.  I agree with the assessment, diagnosis, and plan of care documented in the resident's note.  

## 2020-12-29 NOTE — Telephone Encounter (Signed)
Received faxed request for order, CMN, and OV notes addressing need for urinary incontinence supplies from Amy H at [facility]. This has not been addressed since 02/07/2020. Patient has upcoming appt on 6/27 with [facility] and can be addressed at that time.

## 2020-12-30 DIAGNOSIS — R32 Unspecified urinary incontinence: Secondary | ICD-10-CM | POA: Diagnosis not present

## 2021-01-25 ENCOUNTER — Ambulatory Visit (INDEPENDENT_AMBULATORY_CARE_PROVIDER_SITE_OTHER): Payer: Medicaid Other | Admitting: Student

## 2021-01-25 ENCOUNTER — Other Ambulatory Visit: Payer: Self-pay

## 2021-01-25 ENCOUNTER — Encounter: Payer: Self-pay | Admitting: Student

## 2021-01-25 VITALS — BP 135/80 | HR 65 | Temp 98.1°F | Ht 71.0 in | Wt 242.8 lb

## 2021-01-25 DIAGNOSIS — G894 Chronic pain syndrome: Secondary | ICD-10-CM | POA: Diagnosis not present

## 2021-01-25 DIAGNOSIS — Z0000 Encounter for general adult medical examination without abnormal findings: Secondary | ICD-10-CM | POA: Diagnosis not present

## 2021-01-25 DIAGNOSIS — M5442 Lumbago with sciatica, left side: Secondary | ICD-10-CM

## 2021-01-25 DIAGNOSIS — J4540 Moderate persistent asthma, uncomplicated: Secondary | ICD-10-CM | POA: Diagnosis not present

## 2021-01-25 DIAGNOSIS — J449 Chronic obstructive pulmonary disease, unspecified: Secondary | ICD-10-CM

## 2021-01-25 DIAGNOSIS — G8929 Other chronic pain: Secondary | ICD-10-CM | POA: Diagnosis not present

## 2021-01-25 DIAGNOSIS — E7800 Pure hypercholesterolemia, unspecified: Secondary | ICD-10-CM

## 2021-01-25 DIAGNOSIS — J310 Chronic rhinitis: Secondary | ICD-10-CM | POA: Diagnosis not present

## 2021-01-25 DIAGNOSIS — R339 Retention of urine, unspecified: Secondary | ICD-10-CM

## 2021-01-25 DIAGNOSIS — I10 Essential (primary) hypertension: Secondary | ICD-10-CM

## 2021-01-25 MED ORDER — PREGABALIN 100 MG PO CAPS: 100.0000 mg | ORAL_CAPSULE | Freq: Three times a day (TID) | ORAL | 0 refills | Status: DC

## 2021-01-25 MED ORDER — AMLODIPINE BESYLATE 10 MG PO TABS: 10.0000 mg | ORAL_TABLET | Freq: Every day | ORAL | 1 refills | Status: DC

## 2021-01-25 MED ORDER — CETIRIZINE HCL 10 MG PO TABS: 10.0000 mg | ORAL_TABLET | Freq: Every day | ORAL | 1 refills | Status: DC

## 2021-01-25 MED ORDER — SPIRIVA RESPIMAT 2.5 MCG/ACT IN AERS: 2.0000 | INHALATION_SPRAY | Freq: Every day | RESPIRATORY_TRACT | 5 refills | Status: DC

## 2021-01-25 MED ORDER — LOSARTAN POTASSIUM 100 MG PO TABS: 100.0000 mg | ORAL_TABLET | Freq: Every day | ORAL | 3 refills | Status: DC

## 2021-01-25 MED ORDER — BUDESONIDE-FORMOTEROL FUMARATE 160-4.5 MCG/ACT IN AERO: 2.0000 | INHALATION_SPRAY | Freq: Two times a day (BID) | RESPIRATORY_TRACT | 12 refills | Status: DC

## 2021-01-25 MED ORDER — TAMSULOSIN HCL 0.4 MG PO CAPS: 0.4000 mg | ORAL_CAPSULE | Freq: Every day | ORAL | 2 refills | Status: AC

## 2021-01-25 MED ORDER — FLUTICASONE PROPIONATE 50 MCG/ACT NA SUSP: 1.0000 | Freq: Every day | NASAL | 0 refills | Status: DC

## 2021-01-25 MED ORDER — ATORVASTATIN CALCIUM 40 MG PO TABS: 40.0000 mg | ORAL_TABLET | Freq: Every day | ORAL | 3 refills | Status: DC

## 2021-01-25 MED ORDER — PROAIR HFA 108 (90 BASE) MCG/ACT IN AERS: 2.0000 | INHALATION_SPRAY | Freq: Four times a day (QID) | RESPIRATORY_TRACT | 1 refills | Status: DC | PRN

## 2021-01-25 MED ORDER — ALBUTEROL SULFATE (2.5 MG/3ML) 0.083% IN NEBU: INHALATION_SOLUTION | RESPIRATORY_TRACT | 4 refills | Status: DC

## 2021-01-25 NOTE — Progress Notes (Signed)
   CC:  back pain, incontinence supplies  HPI:  [name] is a 60 y.o. with medical history as below presenting to [facility] for back pain and discuss incontinence supplies.  Past Medical History:  Diagnosis Date   Allergic rhinitis 04/25/2012   Asthma, chronic 10/21/2010   Followed in Pulmonary clinic/ [facility]/ [facility]   - HFA 50% 01/04/2011  > 90% 02/04/2011   - PFT's wnl 02/04/2011  - no intubation before  - has been hospitalized once for asthma in 2012 - quit smoking in 2012 - allergic to aspirin and pollen   10/11/2014>>Repeat pulmonary function tests. Performed for disability application FEV1/FVC=68.5%, FEV1 48%. Consistent with COPD, severe Please see results under media    Chronic pain syndrome 02/03/2011   Involving the shoulder joint  05/20/2013: right shoulder x ray >>Degenerative changes AC joint Seen by PT by 11/2013 >> improvement 03/13/2014 D/c from PT due to orange card being expired and pt cancelling appt    COPD (chronic obstructive pulmonary disease) (HCC)    [place] 6/13, potentially due to ASA use.    GERD (gastroesophageal reflux disease)    Hyperlipidemia    Hypertension    Obesity (BMI 30-39.9)    Substance abuse (HCC)    cocaine 35 years ago   Review of Systems:  As per HPI  Physical Exam:  Vitals:   01/25/21 1325  BP: 135/80  Pulse: 65  Temp: 98.1 F (36.7 C)  TempSrc: Oral  SpO2: 98%  Weight: 242 lb 12.8 oz (110.1 kg)  Height: 5\' 11"  (1.803 m)   General: Well-developed, well-nourished. No acute distress CV: Regular rate, rhythm. No murmurs, rubs, gallops.  Pulm: Normal work of breathing on room air. Distant breath sounds at bases, but otherwise clear to auscultation. No wheezing, rhonchi, rales. MSK: Normal bulk, tone. No pitting edema bilaterally. Neuro: Awake, alert, answering questions appropriately. Psych: Flat affect. Normal speech.  Assessment & Plan:   See Encounters Tab for problem based charting.  Patient discussed with Dr. 

## 2021-01-25 NOTE — Assessment & Plan Note (Addendum)
[name] has history of BPH and urinary incontinence. We were able to fill out his paperwork for continued incontinence supplies.  - Tamsulosin 0.4mg  daily - Incontinence supplies paperwork completed

## 2021-01-25 NOTE — Assessment & Plan Note (Signed)
[name] reports that he is doing well after his recent COPD exacerbation. Denies current dyspnea or cough. He notes that he completed his course of steroids and antibiotics. He saw his pulmonologist a few months ago, but mentions that he needs to schedule another appointment.  - Symbicort 2 puffs twice daily - Spiriva 2 puffs daily - Albuterol nebulizer, inhaler as needed - Follow-up with pulmonologist
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
## 2021-01-25 NOTE — Patient Instructions (Signed)
[name], it was a pleasure seeing you today!  Today we discussed: - Back pain: I have increased your pregabalin (LYRICA) to 100mg  twice daily as needed. Please return to clinic if this medication does not help. I am checking your kidney function today to make sure everything looks good.  - COPD: Please make sure to follow-up with your pulmonologist (lung doctor).  I have ordered the following labs today:   Lab Orders  BMP8+Anion Gap    Referrals ordered today:   Referral Orders  No referral(s) requested today     I have ordered the following medication/changed the following medications:    Start the following medications: Meds ordered this encounter  Medications   albuterol (PROVENTIL) (2.5 MG/3ML) 0.083% nebulizer solution    Sig: USE 1 VIAL VIA NEBULIZER EVERY 6 HOURS AS NEEDED FOR WHEEZING OR SHORTNESS OF BREATH    Dispense:  75 mL    Refill:  4   amLODipine (NORVASC) 10 MG tablet    Sig: Take 1 tablet (10 mg total) by mouth daily.    Dispense:  90 tablet    Refill:  1   atorvastatin (LIPITOR) 40 MG tablet    Sig: Take 1 tablet (40 mg total) by mouth daily.    Dispense:  90 tablet    Refill:  3   budesonide-formoterol (SYMBICORT) 160-4.5 MCG/ACT inhaler    Sig: Inhale 2 puffs into the lungs 2 (two) times daily.    Dispense:  1 each    Refill:  12   cetirizine (ZYRTEC) 10 MG tablet    Sig: Take 1 tablet (10 mg total) by mouth daily.    Dispense:  90 tablet    Refill:  1   fluticasone (FLONASE) 50 MCG/ACT nasal spray    Sig: Place 1 spray into both nostrils daily.    Dispense:  1 g    Refill:  0   losartan (COZAAR) 100 MG tablet    Sig: Take 1 tablet (100 mg total) by mouth daily.    Dispense:  90 tablet    Refill:  3   pregabalin (LYRICA) 100 MG capsule    Sig: Take 1 capsule (100 mg total) by mouth 3 (three) times daily.    Dispense:  90 capsule    Refill:  0   PROAIR HFA 108 (90 Base) MCG/ACT inhaler    Sig: Inhale 2 puffs into the lungs every 6  (six) hours as needed for wheezing or shortness of breath.    Dispense:  8.5 g    Refill:  1   tamsulosin (FLOMAX) 0.4 MG CAPS capsule    Sig: Take 1 capsule (0.4 mg total) by mouth daily.    Dispense:  30 capsule    Refill:  2   Tiotropium Bromide Monohydrate (SPIRIVA RESPIMAT) 2.5 MCG/ACT AERS    Sig: Inhale 2 puffs into the lungs daily.    Dispense:  2 g    Refill:  5     Follow-up: 3 months   Please make sure to arrive 15 minutes prior to your next appointment. If you arrive late, you may be asked to reschedule.   We look forward to seeing you next time. Please call our clinic at [phone] if you have any questions or concerns. The best time to call is Monday-Friday from 9am-4pm, but there is someone available 24/7. If after hours or the weekend, call the main hospital number and ask for the Internal Medicine Resident On-Call. If you  need medication refills, please notify your pharmacy one week in advance and they will send [place] a request.  Thank you for letting us take part in your care. Wishing you the best!  Thank you, Evlyn Kanner, MD

## 2021-01-25 NOTE — Assessment & Plan Note (Signed)
-   [name] does not wish to pursue colonoscopy at this time.

## 2021-01-25 NOTE — Assessment & Plan Note (Signed)
BP Readings from Last 3 Encounters:  01/25/21 135/80  12/23/20 (!) 141/73  10/06/20 (!) 142/84   Blood pressure improved from previous visits. He reports compliance with medications and mentions he needs a re-fill. Will check kidney function today, have him follow-up in a few months.  - Amlodipine 10mg  daily - Losartan 100mg  daily - BMET today

## 2021-01-26 ENCOUNTER — Encounter: Payer: Self-pay | Admitting: Student

## 2021-01-26 LAB — BMP8+ANION GAP
Anion Gap: 13 mmol/L (ref 10.0–18.0)
BUN/Creatinine Ratio: 9 — ABNORMAL LOW (ref 10–24)
BUN: 9 mg/dL (ref 8–27)
CO2: 26 mmol/L (ref 20–29)
Calcium: 8.9 mg/dL (ref 8.6–10.2)
Chloride: 104 mmol/L (ref 96–106)
Creatinine, Ser: 1.04 mg/dL (ref 0.76–1.27)
Glucose: 101 mg/dL — ABNORMAL HIGH (ref 65–99)
Potassium: 4.1 mmol/L (ref 3.5–5.2)
Sodium: 143 mmol/L (ref 134–144)
eGFR: 82 mL/min/{1.73_m2} (ref >59)

## 2021-01-26 NOTE — Telephone Encounter (Signed)
Order, CMN, and OV notes from 6/27 addressing need for urinary incontinence supplies faxed to Amy H at [employer] [phone]). Fax confirmation receipt received.

## 2021-01-28 NOTE — Progress Notes (Signed)
Internal Medicine Clinic Attending ? ?Case discussed with Dr. Braswell  At the time of the visit.  We reviewed the resident?s history and exam and pertinent patient test results.  I agree with the assessment, diagnosis, and plan of care documented in the resident?s note.  ?

## 2021-01-29 DIAGNOSIS — I10 Essential (primary) hypertension: Secondary | ICD-10-CM | POA: Diagnosis not present

## 2021-01-29 DIAGNOSIS — J449 Chronic obstructive pulmonary disease, unspecified: Secondary | ICD-10-CM | POA: Diagnosis not present

## 2021-01-29 DIAGNOSIS — R32 Unspecified urinary incontinence: Secondary | ICD-10-CM | POA: Diagnosis not present

## 2021-02-14 IMAGING — CT CT MAXILLOFACIAL W/O CM
1 series · 15 of 30 positions shown, 19 images · non-contrast
Comparison: None.

CLINICAL DATA: Fusion protocol.  Sinus pain and pressure.

EXAM:
CT MAXILLOFACIAL WITHOUT CONTRAST
TECHNIQUE: Multidetector CT images of the paranasal sinuses were obtained using
the standard protocol without intravenous contrast.

[Series 4: soft tissue · axial · 0.40mm/px · z∈[-166,-31]mm · 15 of 147 slices shown, 19 images]
[im 6/147  brain]
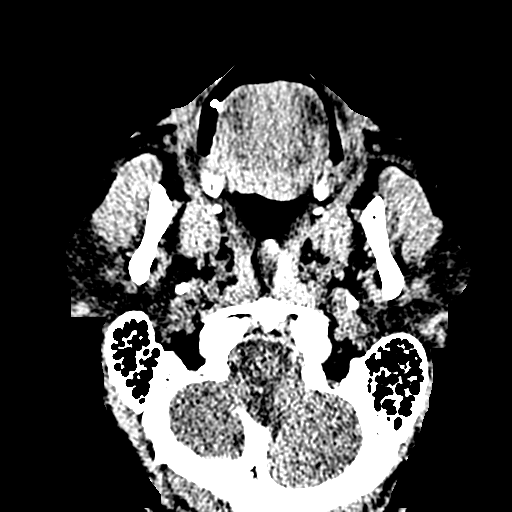
[im 6/147  bone]
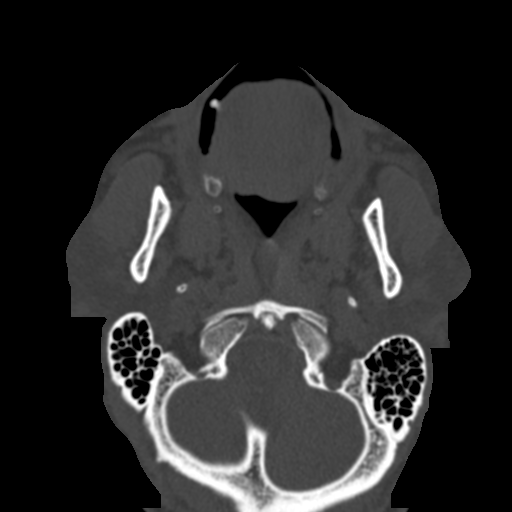
[im 16/147  bone]
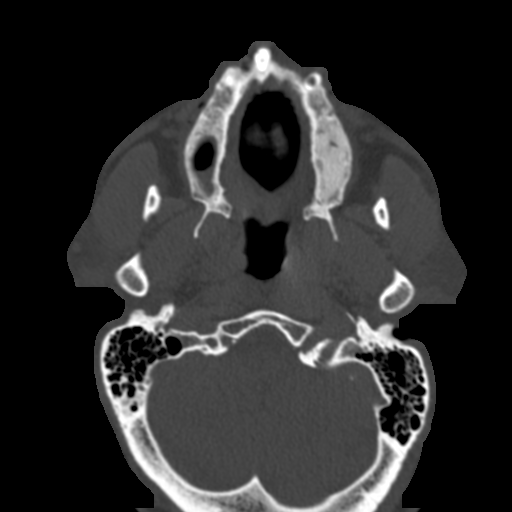
[im 26/147  bone]
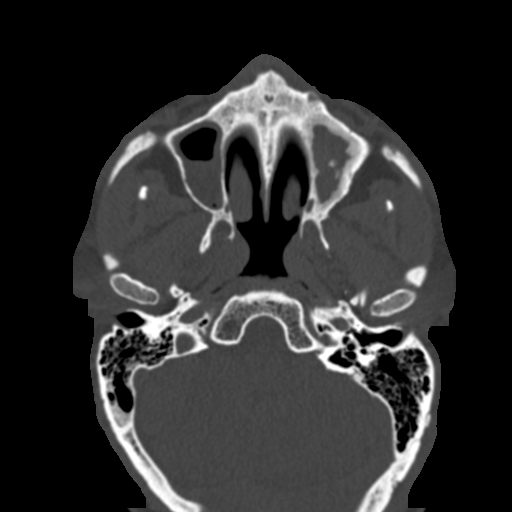
[im 36/147  bone]
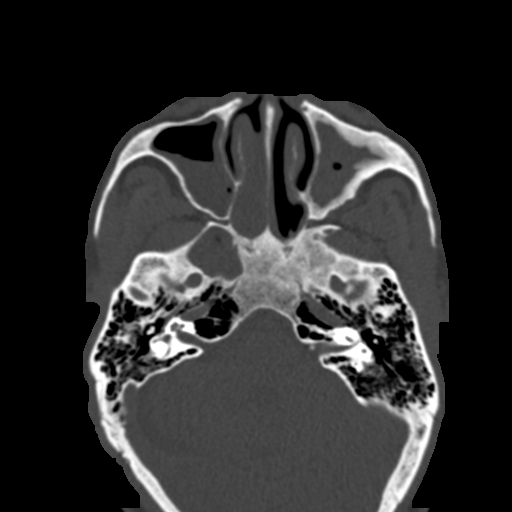
[im 46/147  brain]
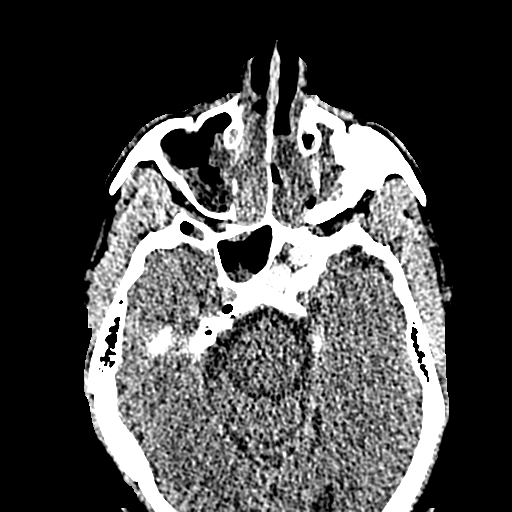
[im 46/147  bone]
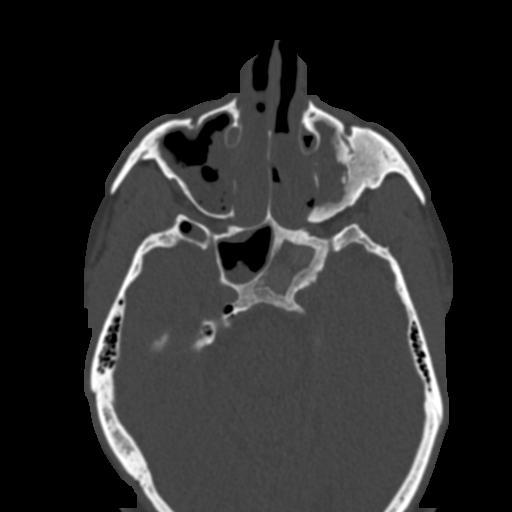
[im 56/147  bone]
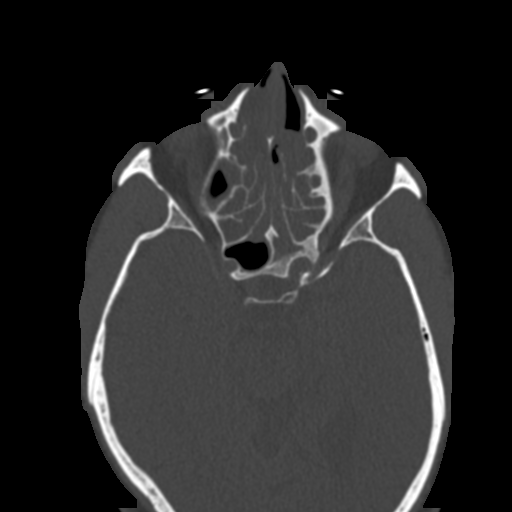
[im 66/147  bone]
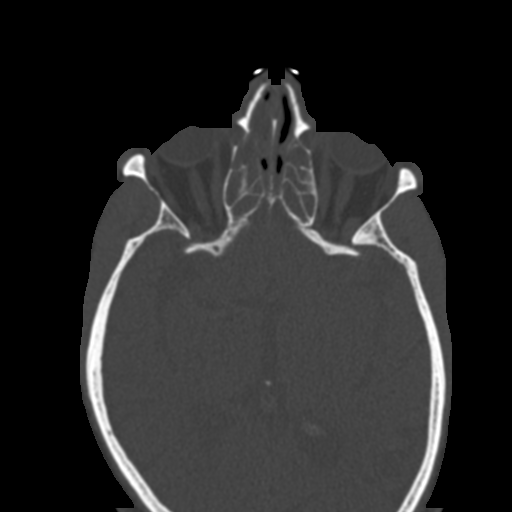
[im 76/147  bone]
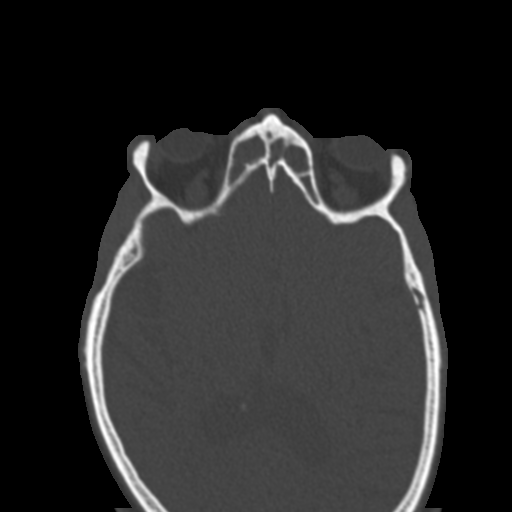
[im 81/147  brain]
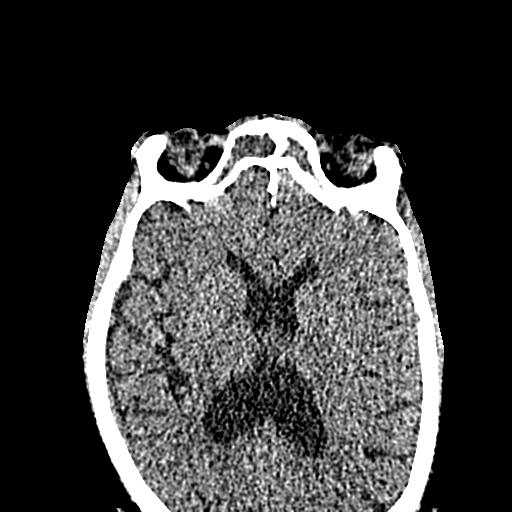
[im 81/147  bone]
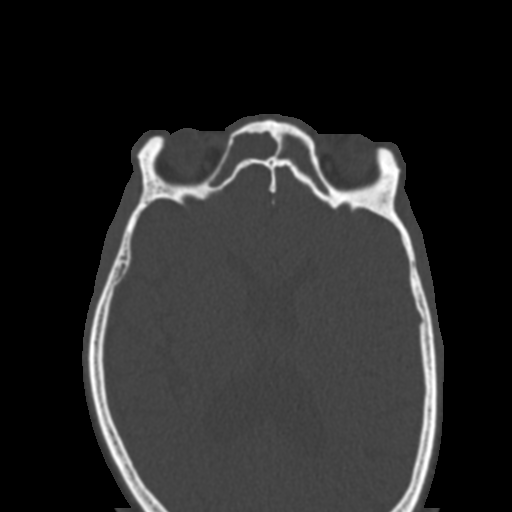
[im 91/147  bone]
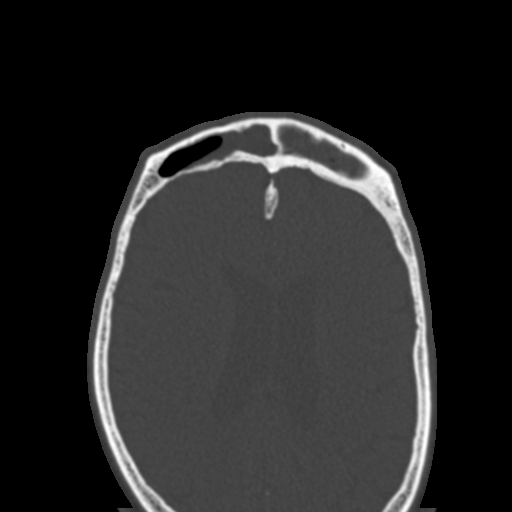
[im 101/147  bone]
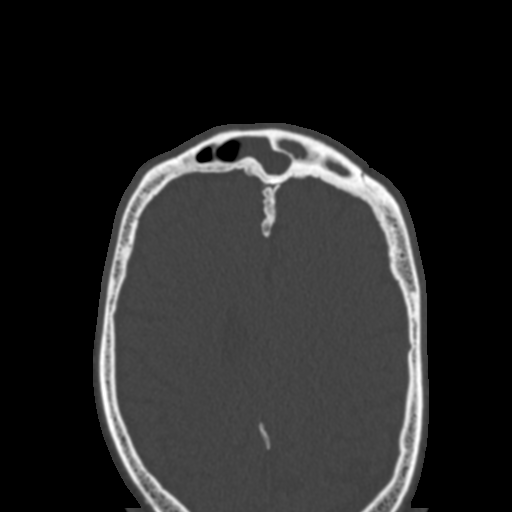
[im 111/147  bone]
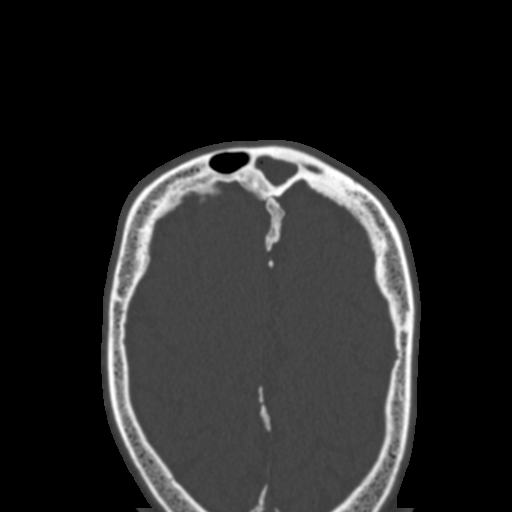
[im 121/147  brain]
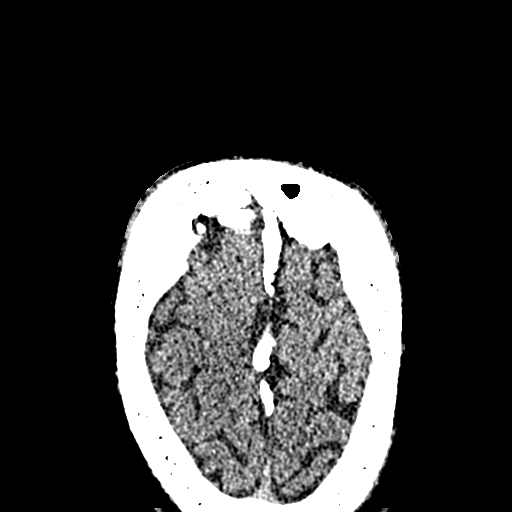
[im 121/147  bone]
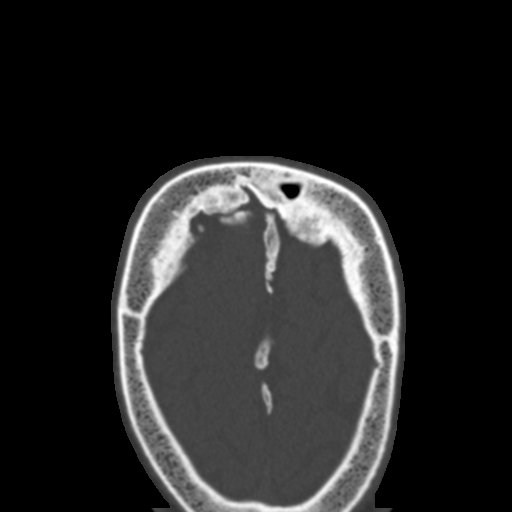
[im 131/147  bone]
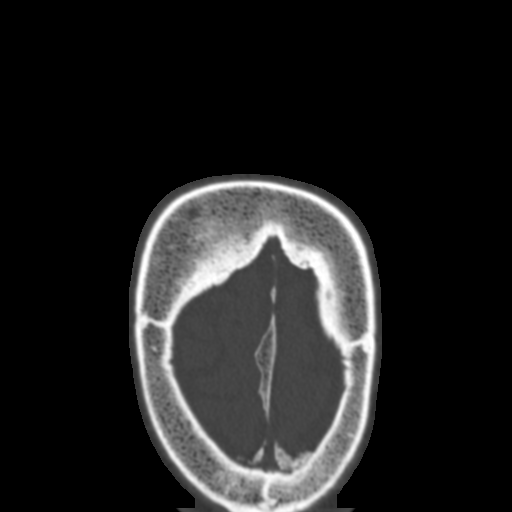
[im 141/147  bone]
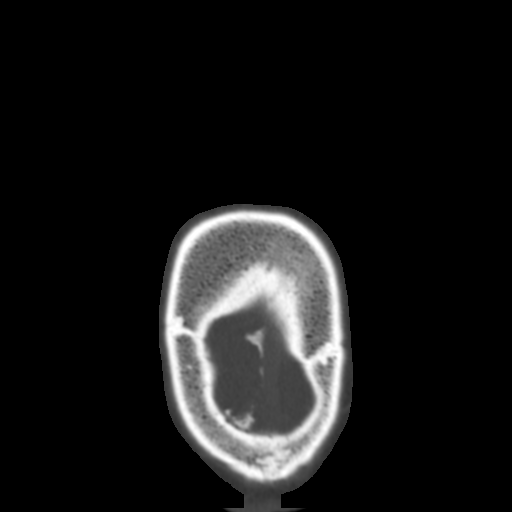

[15 of 30 positions shown; findings below may reference images not displayed]

FINDINGS: Paranasal sinuses:

Frontal: Complete opacification of the left frontal sinus with
chronic mucoperiosteal thickening. Subtotal opacification of the
right frontal sinus with perhaps minimal mucoperiosteal thickening.
Complete opacification in both frontal ethmoid junction regions.

Ethmoid: Complete opacification of both ethmoid sinus regions both
anterior and posterior.

Maxillary: Complete opacification of the left maxillary sinus with
chronic mucoperiosteal thickening. Subtotal opacification of the
right maxillary sinus without visible mucoperiosteal thickening.

Sphenoid: Complete opacification of the left division with
mucoperiosteal thickening. Subtotal opacification of the right
division without mucoperiosteal thickening.

Right ostiomeatal unit: Obscured by regional soft tissue disease.

Left ostiomeatal unit: Obscured by regional soft tissue disease.

Nasal passages: Upper nasal passages are obstructed by soft tissue
thickening, right more than left. Inferior nasal passages are
patent. Intact nasal septum is midline.

Anatomy: Minor pneumatization superior to both anterior ethmoid
notches. Left olfactory groove is broader than the right. Depth
approximately 7 mm. Sellar sphenoid pneumatization pattern. No
dehiscence of carotid or optic canals. No onodi cell.

Other: None
IMPRESSION: 1. Widespread chronic inflammatory changes throughout the paranasal
sinuses as outlined above. Ostiomeatal units obscured by regional
soft tissue disease.
2. Complete opacification of the paranasal sinuses on the left with
mucoperiosteal thickening.
3. Subtotal opacification of the paranasal sinuses on the right
without the chronic mucoperiosteal changes.

## 2021-03-01 DIAGNOSIS — J449 Chronic obstructive pulmonary disease, unspecified: Secondary | ICD-10-CM | POA: Diagnosis not present

## 2021-03-01 DIAGNOSIS — R32 Unspecified urinary incontinence: Secondary | ICD-10-CM | POA: Diagnosis not present

## 2021-03-01 DIAGNOSIS — I10 Essential (primary) hypertension: Secondary | ICD-10-CM | POA: Diagnosis not present

## 2021-03-11 ENCOUNTER — Ambulatory Visit: Payer: Medicaid Other | Admitting: Pulmonary Disease

## 2021-04-01 DIAGNOSIS — R32 Unspecified urinary incontinence: Secondary | ICD-10-CM | POA: Diagnosis not present

## 2021-04-01 DIAGNOSIS — I10 Essential (primary) hypertension: Secondary | ICD-10-CM | POA: Diagnosis not present

## 2021-04-01 DIAGNOSIS — J449 Chronic obstructive pulmonary disease, unspecified: Secondary | ICD-10-CM | POA: Diagnosis not present

## 2021-04-08 ENCOUNTER — Other Ambulatory Visit: Payer: Self-pay | Admitting: Student

## 2021-04-08 DIAGNOSIS — G894 Chronic pain syndrome: Secondary | ICD-10-CM

## 2021-04-12 ENCOUNTER — Ambulatory Visit: Payer: Medicaid Other | Admitting: Pulmonary Disease

## 2021-05-01 DIAGNOSIS — R32 Unspecified urinary incontinence: Secondary | ICD-10-CM | POA: Diagnosis not present

## 2021-05-01 DIAGNOSIS — I10 Essential (primary) hypertension: Secondary | ICD-10-CM | POA: Diagnosis not present

## 2021-05-01 DIAGNOSIS — J449 Chronic obstructive pulmonary disease, unspecified: Secondary | ICD-10-CM | POA: Diagnosis not present

## 2021-05-24 ENCOUNTER — Other Ambulatory Visit: Payer: Self-pay

## 2021-05-24 DIAGNOSIS — J449 Chronic obstructive pulmonary disease, unspecified: Secondary | ICD-10-CM

## 2021-05-24 MED ORDER — PROAIR HFA 108 (90 BASE) MCG/ACT IN AERS: 2.0000 | INHALATION_SPRAY | Freq: Four times a day (QID) | RESPIRATORY_TRACT | 1 refills | Status: DC | PRN

## 2021-06-01 DIAGNOSIS — R32 Unspecified urinary incontinence: Secondary | ICD-10-CM | POA: Diagnosis not present

## 2021-06-01 DIAGNOSIS — I10 Essential (primary) hypertension: Secondary | ICD-10-CM | POA: Diagnosis not present

## 2021-06-01 DIAGNOSIS — J449 Chronic obstructive pulmonary disease, unspecified: Secondary | ICD-10-CM | POA: Diagnosis not present

## 2021-06-25 ENCOUNTER — Other Ambulatory Visit: Payer: Self-pay | Admitting: Student

## 2021-06-25 DIAGNOSIS — G894 Chronic pain syndrome: Secondary | ICD-10-CM

## 2021-07-02 DIAGNOSIS — J449 Chronic obstructive pulmonary disease, unspecified: Secondary | ICD-10-CM | POA: Diagnosis not present

## 2021-07-02 DIAGNOSIS — I10 Essential (primary) hypertension: Secondary | ICD-10-CM | POA: Diagnosis not present

## 2021-07-02 DIAGNOSIS — R32 Unspecified urinary incontinence: Secondary | ICD-10-CM | POA: Diagnosis not present

## 2021-07-19 ENCOUNTER — Encounter: Payer: Medicaid Other | Admitting: Student

## 2021-08-02 DIAGNOSIS — I10 Essential (primary) hypertension: Secondary | ICD-10-CM | POA: Diagnosis not present

## 2021-08-02 DIAGNOSIS — J449 Chronic obstructive pulmonary disease, unspecified: Secondary | ICD-10-CM | POA: Diagnosis not present

## 2021-08-02 DIAGNOSIS — R32 Unspecified urinary incontinence: Secondary | ICD-10-CM | POA: Diagnosis not present

## 2021-08-18 ENCOUNTER — Ambulatory Visit: Payer: Medicaid Other | Admitting: Student

## 2021-08-18 ENCOUNTER — Other Ambulatory Visit: Payer: Self-pay

## 2021-08-18 ENCOUNTER — Encounter: Payer: Self-pay | Admitting: Student

## 2021-08-18 DIAGNOSIS — J449 Chronic obstructive pulmonary disease, unspecified: Secondary | ICD-10-CM

## 2021-08-18 DIAGNOSIS — J310 Chronic rhinitis: Secondary | ICD-10-CM | POA: Diagnosis not present

## 2021-08-18 DIAGNOSIS — J4540 Moderate persistent asthma, uncomplicated: Secondary | ICD-10-CM

## 2021-08-18 DIAGNOSIS — E7800 Pure hypercholesterolemia, unspecified: Secondary | ICD-10-CM

## 2021-08-18 DIAGNOSIS — I10 Essential (primary) hypertension: Secondary | ICD-10-CM | POA: Diagnosis not present

## 2021-08-18 DIAGNOSIS — G894 Chronic pain syndrome: Secondary | ICD-10-CM

## 2021-08-18 MED ORDER — CETIRIZINE HCL 10 MG PO TABS: 10.0000 mg | ORAL_TABLET | Freq: Every day | ORAL | 1 refills | Status: DC

## 2021-08-18 MED ORDER — SPIRIVA RESPIMAT 2.5 MCG/ACT IN AERS: 2.0000 | INHALATION_SPRAY | Freq: Every day | RESPIRATORY_TRACT | 5 refills | Status: DC

## 2021-08-18 MED ORDER — DULOXETINE HCL 60 MG PO CPEP: 60.0000 mg | ORAL_CAPSULE | Freq: Every day | ORAL | 2 refills | Status: DC

## 2021-08-18 MED ORDER — AMLODIPINE BESYLATE 10 MG PO TABS: 10.0000 mg | ORAL_TABLET | Freq: Every day | ORAL | 1 refills | Status: DC

## 2021-08-18 MED ORDER — ALBUTEROL SULFATE (2.5 MG/3ML) 0.083% IN NEBU: 2.5000 mg | INHALATION_SOLUTION | Freq: Four times a day (QID) | RESPIRATORY_TRACT | 12 refills | Status: DC | PRN

## 2021-08-18 MED ORDER — PROAIR HFA 108 (90 BASE) MCG/ACT IN AERS: 2.0000 | INHALATION_SPRAY | Freq: Four times a day (QID) | RESPIRATORY_TRACT | 1 refills | Status: DC | PRN

## 2021-08-18 MED ORDER — ATORVASTATIN CALCIUM 40 MG PO TABS: 40.0000 mg | ORAL_TABLET | Freq: Every day | ORAL | 3 refills | Status: DC

## 2021-08-18 MED ORDER — BUDESONIDE-FORMOTEROL FUMARATE 160-4.5 MCG/ACT IN AERO: 2.0000 | INHALATION_SPRAY | Freq: Two times a day (BID) | RESPIRATORY_TRACT | 12 refills | Status: DC

## 2021-08-18 MED ORDER — FLUTICASONE PROPIONATE 50 MCG/ACT NA SUSP: 1.0000 | Freq: Every day | NASAL | 0 refills | Status: DC

## 2021-08-18 MED ORDER — LOSARTAN POTASSIUM 100 MG PO TABS: 100.0000 mg | ORAL_TABLET | Freq: Every day | ORAL | 3 refills | Status: DC

## 2021-08-18 NOTE — Patient Instructions (Addendum)
Blood pressure Restart your amlodipine and losartan Follow up in 1 month  COPD/Asthma Restart your inhalers Follow up with pulmonology, Contact information below [address] [place], [place], [place] [address]  [phone]  Pain  Start Duloxetine 60 mg daily  Cholesterol Restart you atorvastatin
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
## 2021-08-20 NOTE — Progress Notes (Signed)
Internal Medicine Clinic Attending ? ?Case discussed with Dr. Liang  At the time of the visit.  We reviewed the resident?s history and exam and pertinent patient test results.  I agree with the assessment, diagnosis, and plan of care documented in the resident?s note. ? ?

## 2021-08-20 NOTE — Addendum Note (Signed)
Addended by: Quincy Simmonds on: 08/20/2021 10:31 AM   Modules accepted: Level of Service

## 2021-08-20 NOTE — Assessment & Plan Note (Addendum)
Patient states he has been feeling more short of breath lately as he has run out of his albuterol nebulizer.  Continues to use his Symbicort.  But has been out of his Atrovent and Spiriva after break in about 2 months ago.  States he has been using his nebulizer as they did not take the machine but ran out of his albuterol a couple weeks ago.  He follows with pulmonology for his lung disease.  States he feels that the Spiriva is too strong for him and causes him to cough.  Mild wheezing on exam but does not seem to be in respiratory exam in office today.  Maintains oxygen saturations today.  We will restart him on his Spiriva, Symbicort and Atrovent.  Needs to follow-up with his pulmonologist.  Contact information for pulmonology provided.
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
## 2021-08-20 NOTE — Assessment & Plan Note (Signed)
History of her chronic pain to the right shoulder states pregabalin was not helpful in the past.  Has been off his medications for about 2 months.  Would like to try something else to help with his chronic pain.  We will start on duloxetine 60 mg daily and have him follow-up in 1 month.

## 2021-08-25 ENCOUNTER — Other Ambulatory Visit: Payer: Self-pay

## 2021-08-25 DIAGNOSIS — J449 Chronic obstructive pulmonary disease, unspecified: Secondary | ICD-10-CM

## 2021-08-26 MED ORDER — ALBUTEROL SULFATE HFA 108 (90 BASE) MCG/ACT IN AERS: 2.0000 | INHALATION_SPRAY | Freq: Four times a day (QID) | RESPIRATORY_TRACT | 2 refills | Status: DC | PRN

## 2021-10-01 DIAGNOSIS — I10 Essential (primary) hypertension: Secondary | ICD-10-CM | POA: Diagnosis not present

## 2021-10-01 DIAGNOSIS — J449 Chronic obstructive pulmonary disease, unspecified: Secondary | ICD-10-CM | POA: Diagnosis not present

## 2021-10-01 DIAGNOSIS — R32 Unspecified urinary incontinence: Secondary | ICD-10-CM | POA: Diagnosis not present

## 2021-11-05 DIAGNOSIS — J449 Chronic obstructive pulmonary disease, unspecified: Secondary | ICD-10-CM | POA: Diagnosis not present

## 2021-11-05 DIAGNOSIS — R32 Unspecified urinary incontinence: Secondary | ICD-10-CM | POA: Diagnosis not present

## 2021-11-05 DIAGNOSIS — I10 Essential (primary) hypertension: Secondary | ICD-10-CM | POA: Diagnosis not present

## 2021-12-14 ENCOUNTER — Other Ambulatory Visit: Payer: Self-pay | Admitting: Student

## 2021-12-22 DIAGNOSIS — J449 Chronic obstructive pulmonary disease, unspecified: Secondary | ICD-10-CM | POA: Diagnosis not present

## 2021-12-22 DIAGNOSIS — I10 Essential (primary) hypertension: Secondary | ICD-10-CM | POA: Diagnosis not present

## 2021-12-22 DIAGNOSIS — R32 Unspecified urinary incontinence: Secondary | ICD-10-CM | POA: Diagnosis not present

## 2021-12-29 ENCOUNTER — Encounter: Payer: Self-pay | Admitting: Internal Medicine

## 2021-12-29 ENCOUNTER — Other Ambulatory Visit: Payer: Self-pay

## 2021-12-29 ENCOUNTER — Ambulatory Visit (INDEPENDENT_AMBULATORY_CARE_PROVIDER_SITE_OTHER): Payer: Medicaid Other | Admitting: Internal Medicine

## 2021-12-29 VITALS — BP 122/76 | HR 74 | Temp 97.5°F | Ht 71.0 in | Wt 234.6 lb

## 2021-12-29 DIAGNOSIS — Z87891 Personal history of nicotine dependence: Secondary | ICD-10-CM | POA: Diagnosis not present

## 2021-12-29 DIAGNOSIS — I10 Essential (primary) hypertension: Secondary | ICD-10-CM

## 2021-12-29 DIAGNOSIS — Z125 Encounter for screening for malignant neoplasm of prostate: Secondary | ICD-10-CM

## 2021-12-29 DIAGNOSIS — N39490 Overflow incontinence: Secondary | ICD-10-CM

## 2021-12-29 DIAGNOSIS — J302 Other seasonal allergic rhinitis: Secondary | ICD-10-CM | POA: Diagnosis not present

## 2021-12-29 DIAGNOSIS — J4540 Moderate persistent asthma, uncomplicated: Secondary | ICD-10-CM

## 2021-12-29 DIAGNOSIS — R32 Unspecified urinary incontinence: Secondary | ICD-10-CM | POA: Insufficient documentation

## 2021-12-29 MED ORDER — TAMSULOSIN HCL 0.4 MG PO CAPS: 0.4000 mg | ORAL_CAPSULE | Freq: Every day | ORAL | 0 refills | Status: DC

## 2021-12-29 MED ORDER — IPRATROPIUM-ALBUTEROL 0.5-2.5 (3) MG/3ML IN SOLN
3.0000 mL | Freq: Once | RESPIRATORY_TRACT | Status: AC
  Administered 2021-12-29: 3 mL via RESPIRATORY_TRACT
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
## 2021-12-29 NOTE — Assessment & Plan Note (Addendum)
Home medications include amlodipine 10 mg daily and losartan 100 mg daily.  Patient blood pressure at goal at 122/76.  Continue current medication regimen.   P: -Follow-up BMP

## 2021-12-29 NOTE — Assessment & Plan Note (Addendum)
Patient has history of nasal polyps and patient was scheduled for endoscopic antrospomy tissue removal in 2021, however, patient canceled the procedure.  He is still experiencing difficulty breathing through his nose and congestion.  He would like to go through with the procedure and would like a referral to ENT.    P: -Referral to ENT

## 2021-12-29 NOTE — Patient Instructions (Addendum)
Thank you, [name] for allowing [place] to provide your care today.   I have ordered the following labs for you:  Lab Orders         BMP8+Anion Gap         PSA       Referrals ordered today:   Referral Orders         Ambulatory referral to ENT       Follow up: 3 months   Remember: Continue taking all your medications as prescribed.  Should you have any questions or concerns please call the internal medicine clinic at [phone].    Dellis Filbert, MD [facility]

## 2021-12-29 NOTE — Assessment & Plan Note (Signed)
Home medication include albuterol rescue inhaler and nebulizer.  Patient is compliant with medication.  States he has to use his nebulizer at least once a day due to shortness of breath and nasal congestion.  He has concomitant COPD as well as allergic rhinitis with nasal polyps.  This causes difficulty breathing through his nose and he endorsed with seasonal allergies it exacerbates his symptoms.  On exam, nasal congestion noted.  No increased work of breathing noted.  On lung auscultation expiratory wheezing appreciated at bilateral lung bases.  Patient stated he has not used his nebulizer today.  Nebulizer given in office.  Patient endorsed much improvement in his breathing.
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
## 2021-12-30 LAB — BMP8+ANION GAP
Anion Gap: 16 mmol/L (ref 10.0–18.0)
BUN/Creatinine Ratio: 9 — ABNORMAL LOW (ref 10–24)
BUN: 10 mg/dL (ref 8–27)
CO2: 24 mmol/L (ref 20–29)
Calcium: 9.3 mg/dL (ref 8.6–10.2)
Chloride: 100 mmol/L (ref 96–106)
Creatinine, Ser: 1.12 mg/dL (ref 0.76–1.27)
Glucose: 91 mg/dL (ref 70–99)
Potassium: 3.9 mmol/L (ref 3.5–5.2)
Sodium: 140 mmol/L (ref 134–144)
eGFR: 74 mL/min/{1.73_m2} (ref >59)

## 2021-12-30 LAB — PSA: Prostate Specific Ag, Serum: 11 ng/mL — ABNORMAL HIGH (ref 0.0–4.0)

## 2021-12-30 NOTE — Progress Notes (Signed)
Internal Medicine Clinic Attending ? ?Case discussed with Dr. Ariwodo  At the time of the visit.  We reviewed the resident?s history and exam and pertinent patient test results.  I agree with the assessment, diagnosis, and plan of care documented in the resident?s note.  ?

## 2022-01-03 ENCOUNTER — Telehealth: Payer: Self-pay | Admitting: *Deleted

## 2022-01-03 NOTE — Telephone Encounter (Signed)
Called pt - no answer; left message for pt to call the office. °

## 2022-01-03 NOTE — Telephone Encounter (Signed)
-----   Message from Lianne Bushy sent at 01/03/2022  1:41 PM EDT ----- Please read the last line. Totally out of my job description.  I will make the appt.  Who wants to call him and give him those instructions.  Appt is scheduled for 01/17/22 @ 1130 am.  Thanks,  Irish Elders ----- Message ----- From: Dellis Filbert, MD Sent: 12/30/2021  11:20 AM EDT To: [facility] Front Desk Pool  Good morning,  Could you please schedule this patient for a lab only- PSA level in 2 weeks. Please instruct him to avoid long bicycle rides and avoid ejaculation within 48 hours prior to the test. Thank you!    -Dr. Ruben Im

## 2022-01-04 NOTE — Telephone Encounter (Signed)
Pt called and informed for lab only appt in 2 weeks. Appt schedule 6/19 @ 1130 Am and instructed " to avoid long bicycle rides and avoid ejaculation within 48 hours prior to the test." Per Dr Jeanice Lim.

## 2022-01-05 ENCOUNTER — Other Ambulatory Visit: Payer: Self-pay | Admitting: Student

## 2022-01-14 ENCOUNTER — Other Ambulatory Visit: Payer: Self-pay | Admitting: Student

## 2022-01-14 DIAGNOSIS — N39490 Overflow incontinence: Secondary | ICD-10-CM

## 2022-01-17 ENCOUNTER — Other Ambulatory Visit (INDEPENDENT_AMBULATORY_CARE_PROVIDER_SITE_OTHER): Payer: Medicaid Other

## 2022-01-17 ENCOUNTER — Other Ambulatory Visit: Payer: Self-pay | Admitting: *Deleted

## 2022-01-17 DIAGNOSIS — N39490 Overflow incontinence: Secondary | ICD-10-CM

## 2022-01-17 DIAGNOSIS — R972 Elevated prostate specific antigen [PSA]: Secondary | ICD-10-CM

## 2022-01-17 NOTE — Addendum Note (Signed)
Addended by: Bufford Spikes on: 01/17/2022 12:19 PM   Modules accepted: Orders

## 2022-01-18 LAB — PSA: Prostate Specific Ag, Serum: 6.7 ng/mL — ABNORMAL HIGH (ref 0.0–4.0)

## 2022-01-20 NOTE — Progress Notes (Signed)
Repeat PSA results were elevated. Referral for Urology has been sent.

## 2022-01-26 ENCOUNTER — Emergency Department (HOSPITAL_COMMUNITY)
Admission: EM | Admit: 2022-01-26 | Discharge: 2022-01-26 | Discharge status: Against Medical Advice | Payer: Medicaid Other | Attending: Emergency Medicine | Admitting: Emergency Medicine

## 2022-01-26 DIAGNOSIS — Z5321 Procedure and treatment not carried out due to patient leaving prior to being seen by health care provider: Secondary | ICD-10-CM | POA: Insufficient documentation

## 2022-01-26 DIAGNOSIS — R61 Generalized hyperhidrosis: Secondary | ICD-10-CM | POA: Diagnosis not present

## 2022-01-26 DIAGNOSIS — R55 Syncope and collapse: Secondary | ICD-10-CM | POA: Insufficient documentation

## 2022-01-26 DIAGNOSIS — T675XXA Heat exhaustion, unspecified, initial encounter: Secondary | ICD-10-CM | POA: Diagnosis not present

## 2022-01-26 NOTE — ED Notes (Signed)
Patient stated that he is getting picked up and not staying

## 2022-01-26 NOTE — ED Triage Notes (Signed)
Pt bib ems from [facility]. Pt had a syncopal episode after standing in the sun for 2 hours. Pt was able to lower himself to the floor. +orthostatic hypotension. EKG unremarkable. Given 800cc NS en route.

## 2022-01-27 ENCOUNTER — Ambulatory Visit: Payer: Medicaid Other | Admitting: Urology

## 2022-02-04 ENCOUNTER — Other Ambulatory Visit: Payer: Self-pay | Admitting: Student

## 2022-02-04 DIAGNOSIS — I10 Essential (primary) hypertension: Secondary | ICD-10-CM

## 2022-02-09 DIAGNOSIS — I10 Essential (primary) hypertension: Secondary | ICD-10-CM | POA: Diagnosis not present

## 2022-02-09 DIAGNOSIS — J449 Chronic obstructive pulmonary disease, unspecified: Secondary | ICD-10-CM | POA: Diagnosis not present

## 2022-02-09 DIAGNOSIS — R32 Unspecified urinary incontinence: Secondary | ICD-10-CM | POA: Diagnosis not present

## 2022-02-15 ENCOUNTER — Telehealth: Payer: Self-pay | Admitting: *Deleted

## 2022-02-15 NOTE — Telephone Encounter (Signed)
Call from patient states is having blood in urine x 4 days.  Has had I the past but went away and now is back.  Given appointment for tomorrow am.  Transportation  is an issue for patient.

## 2022-02-16 ENCOUNTER — Ambulatory Visit (INDEPENDENT_AMBULATORY_CARE_PROVIDER_SITE_OTHER): Payer: Medicaid Other | Admitting: Student

## 2022-02-16 VITALS — BP 155/84 | HR 68 | Temp 98.2°F | Wt 231.5 lb

## 2022-02-16 DIAGNOSIS — Z87891 Personal history of nicotine dependence: Secondary | ICD-10-CM

## 2022-02-16 DIAGNOSIS — R319 Hematuria, unspecified: Secondary | ICD-10-CM | POA: Diagnosis present

## 2022-02-16 DIAGNOSIS — R109 Unspecified abdominal pain: Secondary | ICD-10-CM

## 2022-02-16 DIAGNOSIS — I10 Essential (primary) hypertension: Secondary | ICD-10-CM

## 2022-02-16 DIAGNOSIS — E7800 Pure hypercholesterolemia, unspecified: Secondary | ICD-10-CM

## 2022-02-16 DIAGNOSIS — Z1211 Encounter for screening for malignant neoplasm of colon: Secondary | ICD-10-CM
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
## 2022-02-16 NOTE — Assessment & Plan Note (Signed)
Hypertensive in the office today. Regimen of amlodipine 10 mg and losartan 100 mg. Has not been able to afford his flomax, will reassess BP and consider addition of diuretic (triplet therapy pill) if still elevated BP on flomax. He does not have a BP cuff at home.

## 2022-02-16 NOTE — Progress Notes (Signed)
CC: Hematuria follow up  HPI:  [name] is a 61 y.o. male living with a history stated below and presents today for hematuria. Please see problem based assessment and plan for additional details.  Past Medical History:  Diagnosis Date   Allergic rhinitis 04/25/2012   Asthma, chronic 10/21/2010   Followed in Pulmonary clinic/  [facility]   - HFA 50% 01/04/2011  > 90% 02/04/2011   - PFT's wnl 02/04/2011  - no intubation before  - has been hospitalized once for asthma in 2012 - quit smoking in 2012 - allergic to aspirin and pollen   10/11/2014>>Repeat pulmonary function tests. Performed for disability application FEV1/FVC=68.5%, FEV1 48%. Consistent with COPD, severe Please see results under media    Chronic pain syndrome 02/03/2011   Involving the shoulder joint  05/20/2013: right shoulder x ray >>Degenerative changes AC joint Seen by PT by 11/2013 >> improvement 03/13/2014 D/c from PT due to orange card being expired and pt cancelling appt    COPD (chronic obstructive pulmonary disease) (HCC)    [place] 6/13, potentially due to ASA use.    GERD (gastroesophageal reflux disease)    Hyperlipidemia    Hypertension    Obesity (BMI 30-39.9)    Substance abuse (HCC)    cocaine 35 years ago    Current Outpatient Medications on File Prior to Visit  Medication Sig Dispense Refill   albuterol (PROVENTIL) (2.5 MG/3ML) 0.083% nebulizer solution Take 3 mLs (2.5 mg total) by nebulization every 6 (six) hours as needed for wheezing or shortness of breath. 75 mL 12   amLODipine (NORVASC) 10 MG tablet TAKE 1 TABLET(10 MG) BY MOUTH DAILY 90 tablet 1   atorvastatin (LIPITOR) 40 MG tablet Take 1 tablet (40 mg total) by mouth daily. 90 tablet 3   budesonide-formoterol (SYMBICORT) 160-4.5 MCG/ACT inhaler Inhale 2 puffs into the lungs 2 (two) times daily. 1 each 12   cetirizine (ZYRTEC) 10 MG tablet Take 1 tablet (10 mg total) by mouth daily. 90 tablet 1   DULoxetine (CYMBALTA) 60 MG capsule TAKE 1  CAPSULE(60 MG) BY MOUTH DAILY 30 capsule 2   fluticasone (FLONASE) 50 MCG/ACT nasal spray Place 1 spray into both nostrils daily. 1 g 0   losartan (COZAAR) 100 MG tablet Take 1 tablet (100 mg total) by mouth daily. 90 tablet 3   pregabalin (LYRICA) 100 MG capsule TAKE 1 CAPSULE(100 MG) BY MOUTH THREE TIMES DAILY 90 capsule 1   tamsulosin (FLOMAX) 0.4 MG CAPS capsule Take 1 capsule (0.4 mg total) by mouth daily. 90 capsule 0   Tiotropium Bromide Monohydrate (SPIRIVA RESPIMAT) 2.5 MCG/ACT AERS Inhale 2 puffs into the lungs daily. 2 g 5   VENTOLIN HFA 108 (90 Base) MCG/ACT inhaler INHALE 2 PUFFS INTO THE LUNGS EVERY 6 HOURS AS NEEDED FOR WHEEZING OR SHORTNESS OF BREATH 18 g 1   No current facility-administered medications on file prior to visit.    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  Tobacco Use   Smoking status: Former  Packs/day: 1.00    Years: 40.00    Total pack years: 40.00    Types: Cigarettes    Quit date: 10/30/2009    Years since quitting: 12.3   Smokeless tobacco: Never  Substance and Sexual Activity   Alcohol use: Not on file    Comment: Beer on wkends   Drug use: No    Comment: former cocaine use 35 years ago   Sexual activity: Not on file    Comment: unemployed, was a Investment banker, operational  Other Topics Concern   Not on file  Social History Narrative   Not on file   Social Determinants of Health   Financial Resource Strain: Not on file  Food Insecurity: Not on file  Transportation Needs: Not on file  Physical Activity: Not on file  Stress: Not on file  Social Connections: Not on file  Intimate Partner  Violence: Not on file    Review of Systems: ROS negative except for what is noted on the assessment and plan.  Vitals:   02/16/22 0843 02/16/22 0912 02/16/22 0941  BP: (!) 146/82 (!) 154/80 (!) 155/84  Pulse: 72 68 68  Temp: 98.2 F (36.8 C)    TempSrc: Oral    SpO2: 97%    Weight: 231 lb 8 oz (105 kg)      Physical Exam: Constitutional: in no acute distress HENT: normocephalic atraumatic, mucous membranes moist Eyes: conjunctiva non-erythematous Neck: supple Cardiovascular: regular rate and rhythm, no m/r/g Pulmonary/Chest: normal work of breathing on room air MSK: normal bulk and tone. Right CVA tenderness Neurological: alert & oriented x 3, 5/5 strength in bilateral upper and lower extremities, normal gait Skin: warm and dry Psych: normal mood  Assessment & Plan:   Hematuria Patient presents to clinic today with concerns regarding hematuria.  He states he had an episode 3 months ago for 4 days that was associated with right-sided lower back pain.  This resolved and he has not had symptoms until 6 days ago.  He describes the urine color as a grape juice color.  The urine has lightened up over the past few days.  He continues to have the right lower back pain.  He denies fevers or chills or noticing that he has passed anything in his urine.  The only medication changes recently is that over the past 3 to 4 months he has not been unable to afford his Flomax.  Symptoms consistent with nephrolithiasis, especially considering the symptoms started after stopping his Flomax.  We will check a urinalysis and BMP as well as give the patient a strainer. We will also order CT abdomen pelvis without contrast for further evaluation. Given strict return precautions. Follows with urology after bladder/urethral injury in 1988, do not suspect this is etiology.   -given strainer -BMP, UA, CT a/p -return precautions given -restart flomax as soon as he can     Essential hypertension,  benign Hypertensive in the office today. Regimen of amlodipine 10 mg and losartan 100 mg. Has not been able to afford his flomax, will reassess BP and consider addition of diuretic (triplet therapy pill) if still elevated BP on flomax. He does not have a BP cuff at home.   Patient discussed with Dr. Dario Ave, D.O. [facility], PGY-3 Phone: [phone] Date 02/16/2022 Time 11:12 AM

## 2022-02-16 NOTE — Patient Instructions (Signed)
Thank you, [name] for allowing [place] to provide your care today. Today we discussed .  Back Pain Blood in Urine I suspect you are having kidney stones when you do not take your flomax. We will be ordering a CT scan and some lab work to check. If you have any new symptoms of fever, chills, worsening back pain, difficulty with urinating, please call our clinic and be seen.      I have ordered the following labs for you:   Lab Orders         Fecal occult blood, imunochemical         BMP8+Anion Gap         Urinalysis, Complete (81001)         Lipid Profile       Referrals ordered today:   Referral Orders  No referral(s) requested today     I have ordered the following medication/changed the following medications:   Stop the following medications: There are no discontinued medications.   Start the following medications: No orders of the defined types were placed in this encounter.    Follow up:  3 months follow up     Should you have any questions or concerns please call the internal medicine clinic at [phone].    [name], D.O. [facility]

## 2022-02-16 NOTE — Progress Notes (Signed)
Internal Medicine Clinic Attending  Case discussed with the resident at the time of the visit.  We reviewed the resident's history and exam and pertinent patient test results.  I agree with the assessment, diagnosis, and plan of care documented in the resident's note.  

## 2022-02-17 ENCOUNTER — Encounter: Payer: Medicaid Other | Admitting: Student

## 2022-02-17 LAB — MICROSCOPIC EXAMINATION
Bacteria, UA: NONE SEEN
Casts: NONE SEEN /lpf
WBC, UA: >30 /hpf — AB (ref 0–5)

## 2022-02-17 LAB — LIPID PANEL
Chol/HDL Ratio: 2.7 ratio (ref 0.0–5.0)
Cholesterol, Total: 130 mg/dL (ref 100–199)
HDL: 49 mg/dL (ref >39)
LDL Chol Calc (NIH): 67 mg/dL (ref 0–99)
Triglycerides: 70 mg/dL (ref 0–149)
VLDL Cholesterol Cal: 14 mg/dL (ref 5–40)

## 2022-02-17 LAB — URINALYSIS, COMPLETE
Bilirubin, UA: NEGATIVE
Glucose, UA: NEGATIVE
Ketones, UA: NEGATIVE
Nitrite, UA: NEGATIVE
Specific Gravity, UA: 1.016 (ref 1.005–1.030)
Urobilinogen, Ur: 0.2 mg/dL (ref 0.2–1.0)
pH, UA: 5.5 (ref 5.0–7.5)

## 2022-02-17 LAB — BMP8+ANION GAP
Anion Gap: 16 mmol/L (ref 10.0–18.0)
BUN/Creatinine Ratio: 13 (ref 10–24)
BUN: 14 mg/dL (ref 8–27)
CO2: 22 mmol/L (ref 20–29)
Calcium: 9.3 mg/dL (ref 8.6–10.2)
Chloride: 103 mmol/L (ref 96–106)
Creatinine, Ser: 1.04 mg/dL (ref 0.76–1.27)
Glucose: 110 mg/dL — ABNORMAL HIGH (ref 70–99)
Potassium: 4.6 mmol/L (ref 3.5–5.2)
Sodium: 141 mmol/L (ref 134–144)
eGFR: 81 mL/min/{1.73_m2} (ref >59)

## 2022-02-17 NOTE — Assessment & Plan Note (Signed)
Repeat lipid panel performed, total cholesterol of 130 and LDL of 67. Continue atorvastatin 40 mg daily for primary prevention.

## 2022-02-18 ENCOUNTER — Encounter: Payer: Medicaid Other | Admitting: Student

## 2022-02-18 NOTE — Progress Notes (Signed)
Lab results consistent with nephrolithiasis, discussed with patient. Still having some right sided back pain, he will try to pick up his flomax today.

## 2022-02-24 ENCOUNTER — Telehealth: Payer: Self-pay | Admitting: *Deleted

## 2022-02-24 NOTE — Telephone Encounter (Signed)
Called patient lvm regarding radiology appointment with instructions to pick up contrast, also appointment mailed to patient.

## 2022-02-25 ENCOUNTER — Telehealth: Payer: Self-pay

## 2022-02-25 NOTE — Telephone Encounter (Signed)
Returned call to patient. States he never p/u Rx for flomax. He will contact [facility] on [address] to transfer Rx sent 5/31 to [facility] on [address].

## 2022-02-25 NOTE — Telephone Encounter (Signed)
Requesting to speak with a nurse about medication refill, pt do not know the name of the medicine.

## 2022-03-04 ENCOUNTER — Ambulatory Visit (HOSPITAL_COMMUNITY): Payer: Medicaid Other

## 2022-03-17 DIAGNOSIS — Z1211 Encounter for screening for malignant neoplasm of colon: Secondary | ICD-10-CM | POA: Diagnosis not present

## 2022-03-18 ENCOUNTER — Ambulatory Visit (INDEPENDENT_AMBULATORY_CARE_PROVIDER_SITE_OTHER): Payer: Medicaid Other | Admitting: Internal Medicine

## 2022-03-18 ENCOUNTER — Encounter: Payer: Self-pay | Admitting: Internal Medicine

## 2022-03-18 ENCOUNTER — Other Ambulatory Visit: Payer: Self-pay

## 2022-03-18 VITALS — BP 132/76 | HR 75 | Temp 97.6°F | Ht 71.0 in | Wt 242.7 lb

## 2022-03-18 DIAGNOSIS — R319 Hematuria, unspecified: Secondary | ICD-10-CM

## 2022-03-18 DIAGNOSIS — I10 Essential (primary) hypertension: Secondary | ICD-10-CM

## 2022-03-18 DIAGNOSIS — Z87891 Personal history of nicotine dependence: Secondary | ICD-10-CM | POA: Diagnosis not present

## 2022-03-18 DIAGNOSIS — Z1211 Encounter for screening for malignant neoplasm of colon: Secondary | ICD-10-CM

## 2022-03-18 MED ORDER — TAMSULOSIN HCL 0.4 MG PO CAPS
0.4000 mg | ORAL_CAPSULE | Freq: Every day | ORAL | 0 refills | Status: AC
Start: 2022-03-18 — End: 2022-06-16

## 2022-03-18 NOTE — Progress Notes (Signed)
   CC: BP follow up  HPI:  [name] is a 61 y.o. person with a past medical history as detailed below who presents for follow-up of his BP. Please see problem based charting for detailed assessment and plan.  Past Medical History:  Diagnosis Date   Allergic rhinitis 04/25/2012   Asthma, chronic 10/21/2010   Followed in Pulmonary clinic/ [facility]/ [facility]   - HFA 50% 01/04/2011  > 90% 02/04/2011   - PFT's wnl 02/04/2011  - no intubation before  - has been hospitalized once for asthma in 2012 - quit smoking in 2012 - allergic to aspirin and pollen   10/11/2014>>Repeat pulmonary function tests. Performed for disability application FEV1/FVC=68.5%, FEV1 48%. Consistent with COPD, severe Please see results under media    Chronic pain syndrome 02/03/2011   Involving the shoulder joint  05/20/2013: right shoulder x ray >>Degenerative changes AC joint Seen by PT by 11/2013 >> improvement 03/13/2014 D/c from PT due to orange card being expired and pt cancelling appt    COPD (chronic obstructive pulmonary disease) (HCC)    [place] 6/13, potentially due to ASA use.    GERD (gastroesophageal reflux disease)    Hyperlipidemia    Hypertension    Obesity (BMI 30-39.9)    Substance abuse (HCC)    cocaine 35 years ago   Review of Systems:  Negative unless otherwise stated.  Physical Exam:  Vitals:   03/18/22 1015 03/18/22 1023  BP: (!) 140/76 132/76  Pulse: 77 75  Temp: 97.6 F (36.4 C)   TempSrc: Oral   SpO2: 98%   Weight: 242 lb 11.2 oz (110.1 kg)   Height: 5\' 11"  (1.803 m)    Constitutional:Well appearing gentleman, in no acute distress. Cardio:Regular rate and rhythm. No murmurs, rubs, or gallops. Pulm:Clear to auscultation bilaterally. for extremity edema. Skin:Warm and dry. Neuro:Alert and oriented x3. No focal deficit noted. Psych:Pleasant mood and affect.  Assessment & Plan:   See Encounters Tab for problem based charting.  Essential hypertension, benign BP  uncontrolled today, 140/76 with recheck of 132/76. Regimen is amlodipine 10 mg daily and losartan 100 mg daily. He was not previously on Flomax due to financial constraints however has been able to restart this medication and is seeing improvement in BP. Plan:Continue amlodipine 10 mg daily, losartan 100 mg daily, Flomax 0.4 mg daily.  Hematuria Suspected to be due to nephrolithiasis based on UA results and symptom timing in regards to patient not having Flomax. CT abdomen pelvis ordered but not completed to assess for stones due to several deaths in the family. He was restarted on Flomax and has not had recurrent symptoms; he does not believe that he passed a stone. He has not had recurrence of pain or blood in his urine.  Plan:Repeat UA today. If still consistent with nephrolithiasis, will set patient up for CT scan as originally planned.  Patient discussed with Dr.  UYQ:[id number] .

## 2022-03-18 NOTE — Assessment & Plan Note (Signed)
Suspected to be due to nephrolithiasis based on UA results and symptom timing in regards to patient not having Flomax. CT abdomen pelvis ordered but not completed to assess for stones due to several deaths in the family. He was restarted on Flomax and has not had recurrent symptoms; he does not believe that he passed a stone. He has not had recurrence of pain or blood in his urine.  Plan:Repeat UA today. If still consistent with nephrolithiasis, will set patient up for CT scan as originally planned.

## 2022-03-18 NOTE — Patient Instructions (Addendum)
Mr. Mainer,  It was nice seeing you today! Thank you for choosing [facility] for your Primary Care.    Today we talked about:   Blood pressure: Your blood pressure looked great today! Continue taking losartan 100 mg daily, amlodipine 10 mg daily, and Flomax 0.4 mg daily. There are refills already ordered for when you start to run low. CT scan for urine concerns: Since you have not had recurrence of bloody urine or pain, we can hold off on your CT scan. I would like to recheck your urine first to see if there are still findings concerning for a possible kidney stone that would lead [place] to get this scan.  Return to clinic in 3 months.  My best, Dr. August Saucer

## 2022-03-18 NOTE — Assessment & Plan Note (Signed)
BP uncontrolled today, 140/76 with recheck of 132/76. Regimen is amlodipine 10 mg daily and losartan 100 mg daily. He was not previously on Flomax due to financial constraints however has been able to restart this medication and is seeing improvement in BP. Plan:Continue amlodipine 10 mg daily, losartan 100 mg daily, Flomax 0.4 mg daily.

## 2022-03-19 LAB — URINALYSIS, ROUTINE W REFLEX MICROSCOPIC
Bilirubin, UA: NEGATIVE
Glucose, UA: NEGATIVE
Ketones, UA: NEGATIVE
Leukocytes,UA: NEGATIVE
Nitrite, UA: NEGATIVE
RBC, UA: NEGATIVE
Specific Gravity, UA: 1.017 (ref 1.005–1.030)
Urobilinogen, Ur: 0.2 mg/dL (ref 0.2–1.0)
pH, UA: 5.5 (ref 5.0–7.5)

## 2022-03-19 LAB — FECAL OCCULT BLOOD, IMMUNOCHEMICAL: Fecal Occult Bld: POSITIVE — AB

## 2022-03-21 ENCOUNTER — Telehealth: Payer: Self-pay | Admitting: Internal Medicine

## 2022-03-21 DIAGNOSIS — R195 Other fecal abnormalities: Secondary | ICD-10-CM

## 2022-03-21 NOTE — Progress Notes (Signed)
Internal Medicine Clinic Attending ? ?Case discussed with Dr. Dean  At the time of the visit.  We reviewed the resident?s history and exam and pertinent patient test results.  I agree with the assessment, diagnosis, and plan of care documented in the resident?s note.  ?

## 2022-03-21 NOTE — Telephone Encounter (Signed)
Attempted to contact patient regarding test results from last OV. His urine sample came back clear so we will hold off on getting the CT abdomen/pelvis at this time.  His FIT testing did however result positive so I am placing a referral to GI.  Champ Mungo, DO

## 2022-04-08 DIAGNOSIS — J449 Chronic obstructive pulmonary disease, unspecified: Secondary | ICD-10-CM | POA: Diagnosis not present

## 2022-04-08 DIAGNOSIS — I10 Essential (primary) hypertension: Secondary | ICD-10-CM | POA: Diagnosis not present

## 2022-04-08 DIAGNOSIS — R32 Unspecified urinary incontinence: Secondary | ICD-10-CM | POA: Diagnosis not present

## 2022-04-25 ENCOUNTER — Other Ambulatory Visit: Payer: Self-pay | Admitting: Student

## 2022-04-27 ENCOUNTER — Other Ambulatory Visit: Payer: Self-pay

## 2022-04-27 NOTE — Telephone Encounter (Signed)
Ventolin was sent on 9/25 to requested pharmacy.

## 2022-04-27 NOTE — Telephone Encounter (Signed)
albuterol (PROVENTIL) (2.5 MG/3ML) 0.083% nebulizer solution  VENTOLIN HFA 108 (90 Base) MCG/ACT inhaler, REFILL REQUEST @ [facility] [phone] - [place], [place] AT [address].

## 2022-04-28 ENCOUNTER — Telehealth: Payer: Self-pay | Admitting: *Deleted

## 2022-04-28 MED ORDER — ALBUTEROL SULFATE (2.5 MG/3ML) 0.083% IN NEBU: 2.5000 mg | INHALATION_SOLUTION | Freq: Four times a day (QID) | RESPIRATORY_TRACT | 12 refills | Status: DC | PRN

## 2022-04-28 NOTE — Telephone Encounter (Signed)
Call from patient stated did not get refill sent in for his Inhaler or Albuterol Solution.  Call to patient's Pharmacy.  Not sure why medication was not refill will fill for patient today.  Also had a refill left on his Albuterol Solution will fill that for patient today as well.  Will have ready by 11:00 AM.  Patient was informed of.

## 2022-05-09 DIAGNOSIS — I10 Essential (primary) hypertension: Secondary | ICD-10-CM | POA: Diagnosis not present

## 2022-05-09 DIAGNOSIS — J449 Chronic obstructive pulmonary disease, unspecified: Secondary | ICD-10-CM | POA: Diagnosis not present

## 2022-05-09 DIAGNOSIS — R32 Unspecified urinary incontinence: Secondary | ICD-10-CM | POA: Diagnosis not present

## 2022-06-06 ENCOUNTER — Emergency Department (HOSPITAL_COMMUNITY): Payer: Medicaid Other

## 2022-06-06 ENCOUNTER — Emergency Department (HOSPITAL_COMMUNITY)
Admission: EM | Admit: 2022-06-06 | Discharge: 2022-06-06 | Discharge status: Against Medical Advice | Payer: Medicaid Other | Attending: Emergency Medicine | Admitting: Emergency Medicine

## 2022-06-06 ENCOUNTER — Other Ambulatory Visit: Payer: Self-pay

## 2022-06-06 ENCOUNTER — Encounter (HOSPITAL_COMMUNITY): Payer: Self-pay | Admitting: Emergency Medicine

## 2022-06-06 DIAGNOSIS — R0602 Shortness of breath: Secondary | ICD-10-CM | POA: Insufficient documentation

## 2022-06-06 DIAGNOSIS — Z7951 Long term (current) use of inhaled steroids: Secondary | ICD-10-CM | POA: Diagnosis not present

## 2022-06-06 DIAGNOSIS — R0902 Hypoxemia: Secondary | ICD-10-CM | POA: Diagnosis not present

## 2022-06-06 DIAGNOSIS — R0789 Other chest pain: Secondary | ICD-10-CM | POA: Insufficient documentation

## 2022-06-06 DIAGNOSIS — R000 Tachycardia, unspecified: Secondary | ICD-10-CM | POA: Diagnosis not present

## 2022-06-06 DIAGNOSIS — R0689 Other abnormalities of breathing: Secondary | ICD-10-CM | POA: Diagnosis not present

## 2022-06-06 DIAGNOSIS — I10 Essential (primary) hypertension: Secondary | ICD-10-CM | POA: Diagnosis not present

## 2022-06-06 DIAGNOSIS — Z5321 Procedure and treatment not carried out due to patient leaving prior to being seen by health care provider: Secondary | ICD-10-CM | POA: Insufficient documentation

## 2022-06-06 DIAGNOSIS — R062 Wheezing: Secondary | ICD-10-CM | POA: Diagnosis not present

## 2022-06-06 DIAGNOSIS — J449 Chronic obstructive pulmonary disease, unspecified: Secondary | ICD-10-CM | POA: Insufficient documentation

## 2022-06-06 LAB — BASIC METABOLIC PANEL
Anion gap: 11 (ref 5–15)
BUN: 20 mg/dL (ref 8–23)
CO2: 25 mmol/L (ref 22–32)
Calcium: 8.8 mg/dL — ABNORMAL LOW (ref 8.9–10.3)
Chloride: 107 mmol/L (ref 98–111)
Creatinine, Ser: 1.22 mg/dL (ref 0.61–1.24)
GFR, Estimated: >60 mL/min (ref >60)
Glucose, Bld: 129 mg/dL — ABNORMAL HIGH (ref 70–99)
Potassium: 3.9 mmol/L (ref 3.5–5.1)
Sodium: 143 mmol/L (ref 135–145)

## 2022-06-06 LAB — BRAIN NATRIURETIC PEPTIDE: B Natriuretic Peptide: 11.3 pg/mL (ref 0.0–100.0)

## 2022-06-06 LAB — TROPONIN I (HIGH SENSITIVITY): Troponin I (High Sensitivity): 8 ng/L (ref <18)

## 2022-06-06 NOTE — ED Notes (Signed)
Pt approached this tech and EMT Lovena Le, he stated that he wanted his IV removed because he was going to leave because he wasn't going to wait this long to see a doctor. Lovena Le removed the IV and I moved the pt OTF.

## 2022-06-06 NOTE — ED Provider Triage Note (Signed)
Emergency Medicine Provider Triage Evaluation Note  [name] , a 61 y.o. male  was evaluated in triage.  Pt complains of progressively worsening shortness of breath, chest tightness, chest pain since yesterday.  History of COPD not improved with albuterol treatments at home. Administered 5mg  albuterol, 0.5 Atrovent and 2 g of mag in route with EMS with improvement in his symptomatology.  Patient remains tachypneic but with normal oxygen's at this time.  Review of Systems  Positive: As above Negative: Palpitations, CP  Physical Exam  BP (!) 140/78 (BP Location: Right Arm)   Pulse 96   Temp 98.3 F (36.8 C) (Oral)   Resp 17   Ht 5\' 11"  (1.803 m)   Wt 110.1 kg   SpO2 96%   BMI 33.85 kg/m  Gen:   Awake, no distress   Resp:  Increased effort  MSK:   Moves extremities without difficulty  Other:  Wheezing and decreased air movement throughout, tachypneic RRR no m/r/g  Medical Decision Making  Medically screening exam initiated at 6:28 AM.  Appropriate orders placed.  [name] was informed that the remainder of the evaluation will be completed by another provider, this initial triage assessment does not replace that evaluation, and the importance of remaining in the ED until their evaluation is complete.  This chart was dictated using voice recognition software, Dragon. Despite the best efforts of this provider to proofread and correct errors, errors may still occur which can change documentation meaning.    Emeline Darling, PA-C 06/06/22 [phone]

## 2022-06-06 NOTE — ED Triage Notes (Signed)
Per EMS, pt from home, SOB X1 day, tried home meds, no improvements  EMS found him diminished in lowers, wheezng in uppers.  5 of albuterol, .Atrovent, 125 Solumedrol, 2 g of mag,   20G Left AC 148/84 Sinus 88 100% on neb.

## 2022-06-09 DIAGNOSIS — R32 Unspecified urinary incontinence: Secondary | ICD-10-CM | POA: Diagnosis not present

## 2022-06-09 DIAGNOSIS — I10 Essential (primary) hypertension: Secondary | ICD-10-CM | POA: Diagnosis not present

## 2022-06-09 DIAGNOSIS — J449 Chronic obstructive pulmonary disease, unspecified: Secondary | ICD-10-CM | POA: Diagnosis not present

## 2022-06-19 ENCOUNTER — Other Ambulatory Visit: Payer: Self-pay | Admitting: Student

## 2022-06-19 DIAGNOSIS — J310 Chronic rhinitis: Secondary | ICD-10-CM

## 2022-07-28 DIAGNOSIS — R32 Unspecified urinary incontinence: Secondary | ICD-10-CM | POA: Diagnosis not present

## 2022-07-28 DIAGNOSIS — J449 Chronic obstructive pulmonary disease, unspecified: Secondary | ICD-10-CM | POA: Diagnosis not present

## 2022-07-28 DIAGNOSIS — I10 Essential (primary) hypertension: Secondary | ICD-10-CM | POA: Diagnosis not present

## 2022-07-29 ENCOUNTER — Other Ambulatory Visit: Payer: Self-pay | Admitting: Student

## 2022-08-03 ENCOUNTER — Encounter: Payer: Self-pay | Admitting: Gastroenterology

## 2022-08-16 ENCOUNTER — Encounter: Payer: Medicaid Other | Admitting: Internal Medicine

## 2022-08-16 NOTE — Progress Notes (Deleted)
   CC: ***  HPI:[name] is a 62 y.o. male who presents for evaluation of ***. Please see individual problem based A/P for details.  Leg swelling - ddx meds, heart failure, nephrotic, venous thrombosis, liver disease - hx hematuria, htn, takes amlodipine - check UA, cmp   Depression, PHQ-9: Based on the patients  [place] Visit from 12/23/2020 in [place]  PHQ-9 Total Score 0      score we have ***.  Past Medical History:  Diagnosis Date   Allergic rhinitis 04/25/2012   Asthma, chronic 10/21/2010   Followed in Pulmonary clinic/ [facility]/ [facility]   - HFA 50% 01/04/2011  > 90% 02/04/2011   - PFT's wnl 02/04/2011  - no intubation before  - has been hospitalized once for asthma in 2012 - quit smoking in 2012 - allergic to aspirin and pollen   10/11/2014>>Repeat pulmonary function tests. Performed for disability application YTK3/TWS=56.8%, FEV1 48%. Consistent with COPD, severe Please see results under media    Chronic pain syndrome 02/03/2011   Involving the shoulder joint  05/20/2013: right shoulder x ray >>Degenerative changes AC joint Seen by PT by 11/2013 >> improvement 03/13/2014 D/c from PT due to orange card being expired and pt cancelling appt    COPD (chronic obstructive pulmonary disease) ([place])    [place] 6/13, potentially due to ASA use.    GERD (gastroesophageal reflux disease)    Hyperlipidemia    Hypertension    Obesity (BMI 30-39.9)    Substance abuse ([place])    cocaine 35 years ago   Review of Systems:   ROS   Physical Exam: There were no vitals filed for this visit.   General: *** HEENT: Conjunctiva nl , antiicteric sclerae, moist mucous membranes, no exudate or erythema Cardiovascular: Normal rate, regular rhythm.  No murmurs, rubs, or gallops Pulmonary : Equal breath sounds, No wheezes, rales, or rhonchi Abdominal: soft, nontender,  bowel sounds present Ext: No edema in lower extremities, no tenderness to palpation of  lower extremities.   Assessment & Plan:   See Encounters Tab for problem based charting.  Patient {GC/GE:[id number]::"discussed with","seen with"} Dr. {LEXNT:[id number]::"S. Hoffman","Guilloud","Mullen","Narendra","Raines","Vincent","Williams"}

## 2022-09-01 DIAGNOSIS — J324 Chronic pansinusitis: Secondary | ICD-10-CM | POA: Diagnosis not present

## 2022-09-01 DIAGNOSIS — J339 Nasal polyp, unspecified: Secondary | ICD-10-CM | POA: Diagnosis not present

## 2022-09-06 ENCOUNTER — Encounter: Payer: Medicaid Other | Admitting: Student

## 2022-09-13 ENCOUNTER — Ambulatory Visit: Payer: Medicaid Other | Admitting: Gastroenterology

## 2022-09-16 ENCOUNTER — Inpatient Hospital Stay (HOSPITAL_COMMUNITY)
Admission: EM | Admit: 2022-09-16 | Discharge: 2022-09-21 | DRG: 190 | Disposition: A | Discharge status: Home / Self Care | Payer: Medicaid Other | Attending: Internal Medicine | Admitting: Internal Medicine

## 2022-09-16 ENCOUNTER — Emergency Department (HOSPITAL_COMMUNITY): Payer: Medicaid Other

## 2022-09-16 DIAGNOSIS — G8929 Other chronic pain: Secondary | ICD-10-CM | POA: Diagnosis present

## 2022-09-16 DIAGNOSIS — R3916 Straining to void: Secondary | ICD-10-CM | POA: Diagnosis present

## 2022-09-16 DIAGNOSIS — S0232XA Fracture of orbital floor, left side, initial encounter for closed fracture: Secondary | ICD-10-CM | POA: Diagnosis not present

## 2022-09-16 DIAGNOSIS — J441 Chronic obstructive pulmonary disease with (acute) exacerbation: Principal | ICD-10-CM | POA: Diagnosis present

## 2022-09-16 DIAGNOSIS — R062 Wheezing: Secondary | ICD-10-CM | POA: Diagnosis not present

## 2022-09-16 DIAGNOSIS — Z91141 Patient's other noncompliance with medication regimen due to financial hardship: Secondary | ICD-10-CM

## 2022-09-16 DIAGNOSIS — R0902 Hypoxemia: Secondary | ICD-10-CM | POA: Diagnosis not present

## 2022-09-16 DIAGNOSIS — J329 Chronic sinusitis, unspecified: Secondary | ICD-10-CM | POA: Diagnosis not present

## 2022-09-16 DIAGNOSIS — J9621 Acute and chronic respiratory failure with hypoxia: Secondary | ICD-10-CM

## 2022-09-16 DIAGNOSIS — Z1152 Encounter for screening for COVID-19: Secondary | ICD-10-CM

## 2022-09-16 DIAGNOSIS — M549 Dorsalgia, unspecified: Secondary | ICD-10-CM | POA: Diagnosis present

## 2022-09-16 DIAGNOSIS — J339 Nasal polyp, unspecified: Secondary | ICD-10-CM | POA: Diagnosis not present

## 2022-09-16 DIAGNOSIS — J330 Polyp of nasal cavity: Secondary | ICD-10-CM | POA: Diagnosis not present

## 2022-09-16 DIAGNOSIS — I10 Essential (primary) hypertension: Secondary | ICD-10-CM | POA: Diagnosis present

## 2022-09-16 DIAGNOSIS — Z888 Allergy status to other drugs, medicaments and biological substances status: Secondary | ICD-10-CM

## 2022-09-16 DIAGNOSIS — J324 Chronic pansinusitis: Secondary | ICD-10-CM | POA: Diagnosis not present

## 2022-09-16 DIAGNOSIS — Z87891 Personal history of nicotine dependence: Secondary | ICD-10-CM

## 2022-09-16 DIAGNOSIS — J80 Acute respiratory distress syndrome: Secondary | ICD-10-CM | POA: Diagnosis not present

## 2022-09-16 DIAGNOSIS — Z886 Allergy status to analgesic agent status: Secondary | ICD-10-CM

## 2022-09-16 DIAGNOSIS — J3489 Other specified disorders of nose and nasal sinuses: Secondary | ICD-10-CM | POA: Diagnosis not present

## 2022-09-16 DIAGNOSIS — Z597 Insufficient social insurance and welfare support: Secondary | ICD-10-CM

## 2022-09-16 DIAGNOSIS — E785 Hyperlipidemia, unspecified: Secondary | ICD-10-CM | POA: Diagnosis present

## 2022-09-16 DIAGNOSIS — R0602 Shortness of breath: Secondary | ICD-10-CM | POA: Diagnosis not present

## 2022-09-16 DIAGNOSIS — J9601 Acute respiratory failure with hypoxia: Secondary | ICD-10-CM | POA: Diagnosis present

## 2022-09-16 DIAGNOSIS — N401 Enlarged prostate with lower urinary tract symptoms: Secondary | ICD-10-CM | POA: Diagnosis present

## 2022-09-16 LAB — RESP PANEL BY RT-PCR (RSV, FLU A&B, COVID)  RVPGX2
Influenza A by PCR: NEGATIVE
Influenza B by PCR: NEGATIVE
Resp Syncytial Virus by PCR: NEGATIVE
SARS Coronavirus 2 by RT PCR: NEGATIVE

## 2022-09-16 LAB — COMPREHENSIVE METABOLIC PANEL
ALT: 16 U/L (ref 0–44)
AST: 19 U/L (ref 15–41)
Albumin: 3.6 g/dL (ref 3.5–5.0)
Alkaline Phosphatase: 62 U/L (ref 38–126)
Anion gap: 7 (ref 5–15)
BUN: 16 mg/dL (ref 8–23)
CO2: 33 mmol/L — ABNORMAL HIGH (ref 22–32)
Calcium: 8.5 mg/dL — ABNORMAL LOW (ref 8.9–10.3)
Chloride: 98 mmol/L (ref 98–111)
Creatinine, Ser: 1.3 mg/dL — ABNORMAL HIGH (ref 0.61–1.24)
GFR, Estimated: >60 mL/min (ref >60)
Glucose, Bld: 172 mg/dL — ABNORMAL HIGH (ref 70–99)
Potassium: 3.4 mmol/L — ABNORMAL LOW (ref 3.5–5.1)
Sodium: 138 mmol/L (ref 135–145)
Total Bilirubin: 0.4 mg/dL (ref 0.3–1.2)
Total Protein: 7.3 g/dL (ref 6.5–8.1)

## 2022-09-16 LAB — CBC WITH DIFFERENTIAL/PLATELET
Abs Immature Granulocytes: 0.01 10*3/uL (ref 0.00–0.07)
Basophils Absolute: 0 10*3/uL (ref 0.0–0.1)
Basophils Relative: 0 %
Eosinophils Absolute: 0.6 10*3/uL — ABNORMAL HIGH (ref 0.0–0.5)
Eosinophils Relative: 7 %
HCT: 49 % (ref 39.0–52.0)
Hemoglobin: 15.3 g/dL (ref 13.0–17.0)
Immature Granulocytes: 0 %
Lymphocytes Relative: 43 %
Lymphs Abs: 3.5 10*3/uL (ref 0.7–4.0)
MCH: 27.9 pg (ref 26.0–34.0)
MCHC: 31.2 g/dL (ref 30.0–36.0)
MCV: 89.3 fL (ref 80.0–100.0)
Monocytes Absolute: 0.8 10*3/uL (ref 0.1–1.0)
Monocytes Relative: 10 %
Neutro Abs: 3.2 10*3/uL (ref 1.7–7.7)
Neutrophils Relative %: 40 %
Platelets: 265 10*3/uL (ref 150–400)
RBC: 5.49 MIL/uL (ref 4.22–5.81)
RDW: 15.2 % (ref 11.5–15.5)
WBC: 8.1 10*3/uL (ref 4.0–10.5)
nRBC: 0 % (ref 0.0–0.2)

## 2022-09-16 LAB — TROPONIN I (HIGH SENSITIVITY)
Troponin I (High Sensitivity): 8 ng/L (ref <18)
Troponin I (High Sensitivity): 8 ng/L (ref <18)

## 2022-09-16 LAB — BRAIN NATRIURETIC PEPTIDE: B Natriuretic Peptide: 11.2 pg/mL (ref 0.0–100.0)

## 2022-09-16 MED ORDER — IOHEXOL 350 MG/ML SOLN
75.0000 mL | Freq: Once | INTRAVENOUS | Status: AC | PRN
  Administered 2022-09-16: 75 mL via INTRAVENOUS

## 2022-09-16 MED ORDER — SODIUM CHLORIDE 0.9 % IV SOLN
500.0000 mg | Freq: Once | INTRAVENOUS | Status: AC
  Administered 2022-09-16: 500 mg via INTRAVENOUS
  Filled 2022-09-16: qty 5

## 2022-09-16 MED ORDER — IPRATROPIUM-ALBUTEROL 0.5-2.5 (3) MG/3ML IN SOLN
3.0000 mL | Freq: Once | RESPIRATORY_TRACT | Status: AC
  Administered 2022-09-16: 3 mL via RESPIRATORY_TRACT
  Filled 2022-09-16: qty 3

## 2022-09-16 MED ORDER — SODIUM CHLORIDE 0.9 % IV SOLN
1.0000 g | Freq: Once | INTRAVENOUS | Status: AC
  Administered 2022-09-16: 1 g via INTRAVENOUS
  Filled 2022-09-16: qty 10

## 2022-09-16 NOTE — ED Notes (Signed)
MD took pt of O2 to test. Pt sating 92-93 at this time.Instructed to put him back on if it alarms.

## 2022-09-16 NOTE — ED Provider Notes (Signed)
Emergency Department Provider Note   I have reviewed the triage vital signs and the nursing notes.   HISTORY  Chief Complaint Shortness of Breath   HPI [name] is a 62 y.o. male with past medical history of asthma, COPD, hypertension, hyperlipidemia presents emergency department for evaluation of shortness of breath.  He denies associated chest pain or fever but has had some cough and congestion recently.  He became much more short of breath and ultimately called EMS.  EMS arrived to find him sitting outside with O2 sats in the 80s.  He was given magnesium, Solu-Medrol, albuterol nebs and route and arrives on supplemental O2.  He does not wear home oxygen.  He states he has large nasal polyps, awaiting surgery, and cannot breathe well through his nose.   Past Medical History:  Diagnosis Date   Allergic rhinitis 04/25/2012   Asthma, chronic 10/21/2010   Followed in Pulmonary clinic/ [facility]/ [place]   - HFA 50% 01/04/2011  > 90% 02/04/2011   - PFT's wnl 02/04/2011  - no intubation before  - has been hospitalized once for asthma in 2012 - quit smoking in 2012 - allergic to aspirin and pollen   10/11/2014>>Repeat pulmonary function tests. Performed for disability application [id number], FEV1 48%. Consistent with COPD, severe Please see results under media    Chronic pain syndrome 02/03/2011   Involving the shoulder joint  05/20/2013: right shoulder x ray >>Degenerative changes AC joint Seen by PT by 11/2013 >> improvement 03/13/2014 D/c from PT due to orange card being expired and pt cancelling appt    COPD (chronic obstructive pulmonary disease) ([place])    [place] 6/13, potentially due to ASA use.    GERD (gastroesophageal reflux disease)    Hyperlipidemia    Hypertension    Obesity (BMI 30-39.9)    Substance abuse ([place])    cocaine 35 years ago    Review of Systems  Constitutional: No fever/chills Cardiovascular: Positive chest pain. Respiratory: Positive shortness of  breath. Gastrointestinal: No abdominal pain.  No nausea, no vomiting.  No diarrhea.  No constipation. Genitourinary: Negative for dysuria. Musculoskeletal: Negative for back pain. Skin: Negative for rash. Neurological: Negative for headaches.  ____________________________________________   PHYSICAL EXAM:  VITAL SIGNS: Vitals:   09/16/22 2045 09/16/22 2100  BP: (!) 159/79 (!) 161/77  Pulse: 73 73  Resp: 13 16  Temp:    SpO2: 95% 94%   Constitutional: Alert and oriented. Increased WOB.  Eyes: Conjunctivae are normal. Head: Atraumatic. Nose: No congestion/rhinnorhea. Mouth/Throat: Mucous membranes are moist.  Neck: No stridor.   Cardiovascular: Normal rate, regular rhythm. Good peripheral circulation. Grossly normal heart sounds.   Respiratory: Increased respiratory effort.  No retractions. Lungs with poor air entry and end-expiratory wheezing.  Gastrointestinal: Soft and nontender. No distention.  Musculoskeletal: No lower extremity tenderness nor edema. No gross deformities of extremities. Neurologic:  Normal speech and language. No gross focal neurologic deficits are appreciated.  Skin:  Skin is warm, dry and intact. No rash noted.  ____________________________________________   LABS (all labs ordered are listed, but only abnormal results are displayed)  Labs Reviewed  COMPREHENSIVE METABOLIC PANEL - Abnormal; Notable for the following components:      Result Value   Potassium 3.4 (*)    CO2 33 (*)    Glucose, Bld 172 (*)    Creatinine, Ser 1.30 (*)    Calcium 8.5 (*)    All other components within normal limits  CBC WITH DIFFERENTIAL/PLATELET -  Abnormal; Notable for the following components:   Eosinophils Absolute 0.6 (*)    All other components within normal limits  RESP PANEL BY RT-PCR (RSV, FLU A&B, COVID)  [id number]  CULTURE, BLOOD (ROUTINE X 2)  CULTURE, BLOOD (ROUTINE X 2)  BRAIN NATRIURETIC PEPTIDE  TROPONIN I (HIGH SENSITIVITY)  TROPONIN I (HIGH  SENSITIVITY)   ____________________________________________  EKG   EKG Interpretation  Date/Time:  Friday September 16 2022 19:41:42 EST Ventricular Rate:  82 PR Interval:  171 QRS Duration: 84 QT Interval:  464 QTC Calculation: 542 R Axis:   -75 Text Interpretation: Sinus rhythm Probable left atrial enlargement Left anterior fascicular block Probable anteroseptal infarct, old Prolonged QT interval Confirmed by Nanda Quinton [phone]) on 09/16/2022 7:43:52 PM        ____________________________________________  RADIOLOGY  CT Angio Chest PE W and/or Wo Contrast  Result Date: 09/16/2022 CLINICAL DATA:  Shortness of breath EXAM: CT ANGIOGRAPHY CHEST WITH CONTRAST TECHNIQUE: Multidetector CT imaging of the chest was performed using the standard protocol during bolus administration of intravenous contrast. Multiplanar CT image reconstructions and MIPs were obtained to evaluate the vascular anatomy. RADIATION DOSE REDUCTION: This exam was performed according to the departmental dose-optimization program which includes automated exposure control, adjustment of the mA and/or kV according to patient size and/or use of iterative reconstruction technique. CONTRAST:  18m OMNIPAQUE IOHEXOL 350 MG/ML SOLN COMPARISON:  Chest x-ray from earlier in the same day, CT from 09/25/2010. FINDINGS: Cardiovascular: Thoracic aorta demonstrates atherosclerotic calcifications without aneurysmal dilatation. Pulmonary artery shows a normal branching pattern. No pulmonary emboli are identified. Mild coronary calcifications are seen. No cardiac enlargement is noted. Mediastinum/Nodes: Thoracic inlet is within normal limits. No mediastinal adenopathy is seen. Small bilateral hilar lymph nodes are seen stable in appearance from the prior exam and likely reactive in nature. Lungs/Pleura: Lungs are well aerated bilaterally. Changes of mucoid impaction are noted in the right middle and to a greater degree in the right lower lobe  new from the prior exam. Lesser degree of inspissated material is noted in the lower lobe on the left. No sizable parenchymal nodules are seen. No effusion is noted. Upper Abdomen: Visualized upper abdomen is within normal limits. Musculoskeletal: Degenerative changes of the thoracic spine are seen. No acute rib abnormality is noted. Review of the MIP images confirms the above findings. IMPRESSION: No evidence of pulmonary emboli. Increase in the degree of mucoid impaction in the lungs bilaterally worse on the right than the left. Reactive hilar lymph nodes stable from a prior exam of 20/12. No acute abnormality is noted. Aortic Atherosclerosis (ICD10-I70.0). Electronically Signed   By: MInez CatalinaM.D.   On: 09/16/2022 22:21   DG Chest Portable 1 View  Result Date: 09/16/2022 CLINICAL DATA:  SOB EXAM: PORTABLE CHEST 1 VIEW COMPARISON:  Chest x-ray 06/06/2022 FINDINGS: The heart and mediastinal contours are within normal limits. Patchy interstitial and airspace opacities along the lung bases. No pulmonary edema. No pleural effusion. No pneumothorax. No acute osseous abnormality. IMPRESSION: Patchy interstitial and airspace opacities along the lung bases. Finding could represent infection/inflammation. Followup PA and lateral chest X-ray is recommended in 3-4 weeks following therapy to ensure resolution and exclude underlying malignancy. Electronically Signed   By: MIven FinnM.D.   On: 09/16/2022 19:30    ____________________________________________   PROCEDURES  Procedure(s) performed:   Procedures  CRITICAL CARE Performed by: JMargette FastTotal critical care time: 35 minutes Critical care time was exclusive of separately billable procedures and  treating other patients. Critical care was necessary to treat or prevent imminent or life-threatening deterioration. Critical care was time spent personally by me on the following activities: development of treatment plan with patient and/or  surrogate as well as nursing, discussions with consultants, evaluation of patient's response to treatment, examination of patient, obtaining history from patient or surrogate, ordering and performing treatments and interventions, ordering and review of laboratory studies, ordering and review of radiographic studies, pulse oximetry and re-evaluation of patient's condition.  Nanda Quinton, MD Emergency Medicine  ____________________________________________   INITIAL IMPRESSION / ASSESSMENT AND PLAN / ED COURSE  Pertinent labs & imaging results that were available during my care of the patient were reviewed by me and considered in my medical decision making (see chart for details).   This patient is Presenting for Evaluation of CP/SOB, which does require a range of treatment options, and is a complaint that involves a high risk of morbidity and mortality.  The Differential Diagnoses includes but is not exclusive to acute coronary syndrome, aortic dissection, pulmonary embolism, cardiac tamponade, community-acquired pneumonia, pericarditis, musculoskeletal chest wall pain, etc.   Critical Interventions-    Medications  ipratropium-albuterol (DUONEB) 0.5-2.5 (3) MG/3ML nebulizer solution 3 mL (3 mLs Nebulization Given 09/16/22 1927)  ipratropium-albuterol (DUONEB) 0.5-2.5 (3) MG/3ML nebulizer solution 3 mL (3 mLs Nebulization Given 09/16/22 2004)  cefTRIAXone (ROCEPHIN) 1 g in sodium chloride 0.9 % 100 mL IVPB (0 g Intravenous Stopped 09/16/22 2157)  azithromycin (ZITHROMAX) 500 mg in sodium chloride 0.9 % 250 mL IVPB (500 mg Intravenous New Bag/Given 09/16/22 2107)  iohexol (OMNIPAQUE) 350 MG/ML injection 75 mL (75 mLs Intravenous Contrast Given 09/16/22 2209)    Reassessment after intervention: Symptoms improved but continued O2 requirement.    I did obtain Additional Historical Information from EMS.   Clinical Laboratory Tests Ordered, included CBC without leukocytosis. No AKI. Negative  COVID/Flu.   Radiologic Tests Ordered, included CTA PE and CXR. I independently interpreted the images and agree with radiology interpretation.   Cardiac Monitor Tracing which shows NSR.    Social Determinants of Health Risk patient is not an active smoker.   Consult complete with medicine service. Plan for admit.   Medical Decision Making: Summary:  Presents emergency for evaluation of chest pain and shortness of breath.  Seems most consistent with COPD versus asthma exacerbation.  Treatment started by EMS continued here with multiple nebulizers but continues to have O2 requirement.  CTA PE obtained showing some mucous impaction but no PE. Abx started.   Reevaluation with update and discussion with patient. Plan for admit. He is in agreement.    Patient's presentation is most consistent with acute presentation with potential threat to life or bodily function.   Disposition: admit  ____________________________________________  FINAL CLINICAL IMPRESSION(S) / ED DIAGNOSES  Final diagnoses:  Acute on chronic respiratory failure with hypoxia ([place])    Note:  This document was prepared using Dragon voice recognition software and may include unintentional dictation errors.  Nanda Quinton, MD, [facility] Emergency Medicine    Jaileigh Weimer, [facility], MD 09/20/22 [phone]

## 2022-09-16 NOTE — ED Triage Notes (Signed)
The pt arrived by gems from home  he was siting outside when ems arrived with sats in the 70s  ems gave a hhn iv meds   iv per ems     solu=medrol 125  and mag sulfate 2 gms iv enroute

## 2022-09-16 NOTE — ED Notes (Signed)
RN placing pt back on oxygen due to levels dropping to 86.  MD made aware

## 2022-09-16 NOTE — ED Notes (Signed)
MD took pt off of oxygen due to O2 levels looking good.  This tech was notified and told to watch alarms.  RN was notified.

## 2022-09-17 ENCOUNTER — Other Ambulatory Visit: Payer: Self-pay

## 2022-09-17 ENCOUNTER — Encounter (HOSPITAL_COMMUNITY): Payer: Self-pay | Admitting: Internal Medicine

## 2022-09-17 DIAGNOSIS — M549 Dorsalgia, unspecified: Secondary | ICD-10-CM | POA: Diagnosis not present

## 2022-09-17 DIAGNOSIS — Z886 Allergy status to analgesic agent status: Secondary | ICD-10-CM | POA: Diagnosis not present

## 2022-09-17 DIAGNOSIS — Z87891 Personal history of nicotine dependence: Secondary | ICD-10-CM | POA: Diagnosis not present

## 2022-09-17 DIAGNOSIS — I10 Essential (primary) hypertension: Secondary | ICD-10-CM | POA: Diagnosis not present

## 2022-09-17 DIAGNOSIS — E785 Hyperlipidemia, unspecified: Secondary | ICD-10-CM | POA: Diagnosis not present

## 2022-09-17 DIAGNOSIS — R3916 Straining to void: Secondary | ICD-10-CM | POA: Diagnosis not present

## 2022-09-17 DIAGNOSIS — J441 Chronic obstructive pulmonary disease with (acute) exacerbation: Secondary | ICD-10-CM | POA: Diagnosis not present

## 2022-09-17 DIAGNOSIS — Z888 Allergy status to other drugs, medicaments and biological substances status: Secondary | ICD-10-CM | POA: Diagnosis not present

## 2022-09-17 DIAGNOSIS — J45901 Unspecified asthma with (acute) exacerbation: Secondary | ICD-10-CM

## 2022-09-17 DIAGNOSIS — N401 Enlarged prostate with lower urinary tract symptoms: Secondary | ICD-10-CM | POA: Diagnosis not present

## 2022-09-17 DIAGNOSIS — Z1152 Encounter for screening for COVID-19: Secondary | ICD-10-CM | POA: Diagnosis not present

## 2022-09-17 DIAGNOSIS — G8929 Other chronic pain: Secondary | ICD-10-CM | POA: Diagnosis not present

## 2022-09-17 DIAGNOSIS — J9621 Acute and chronic respiratory failure with hypoxia: Secondary | ICD-10-CM | POA: Diagnosis not present

## 2022-09-17 DIAGNOSIS — Z91141 Patient's other noncompliance with medication regimen due to financial hardship: Secondary | ICD-10-CM | POA: Diagnosis not present

## 2022-09-17 DIAGNOSIS — R0602 Shortness of breath: Secondary | ICD-10-CM | POA: Diagnosis not present

## 2022-09-17 DIAGNOSIS — Z597 Insufficient social insurance and welfare support: Secondary | ICD-10-CM | POA: Diagnosis not present

## 2022-09-17 DIAGNOSIS — J9601 Acute respiratory failure with hypoxia: Secondary | ICD-10-CM | POA: Diagnosis not present

## 2022-09-17 LAB — BASIC METABOLIC PANEL
Anion gap: 9 (ref 5–15)
BUN: 14 mg/dL (ref 8–23)
CO2: 31 mmol/L (ref 22–32)
Calcium: 8.4 mg/dL — ABNORMAL LOW (ref 8.9–10.3)
Chloride: 99 mmol/L (ref 98–111)
Creatinine, Ser: 1.23 mg/dL (ref 0.61–1.24)
GFR, Estimated: >60 mL/min (ref >60)
Glucose, Bld: 150 mg/dL — ABNORMAL HIGH (ref 70–99)
Potassium: 3.8 mmol/L (ref 3.5–5.1)
Sodium: 139 mmol/L (ref 135–145)

## 2022-09-17 LAB — CBC
HCT: 47.8 % (ref 39.0–52.0)
Hemoglobin: 15 g/dL (ref 13.0–17.0)
MCH: 27.7 pg (ref 26.0–34.0)
MCHC: 31.4 g/dL (ref 30.0–36.0)
MCV: 88.2 fL (ref 80.0–100.0)
Platelets: 260 10*3/uL (ref 150–400)
RBC: 5.42 MIL/uL (ref 4.22–5.81)
RDW: 15.2 % (ref 11.5–15.5)
WBC: 4.8 10*3/uL (ref 4.0–10.5)
nRBC: 0 % (ref 0.0–0.2)

## 2022-09-17 LAB — HIV ANTIBODY (ROUTINE TESTING W REFLEX): HIV Screen 4th Generation wRfx: NONREACTIVE

## 2022-09-17 MED ORDER — FLUTICASONE FUROATE-VILANTEROL 100-25 MCG/ACT IN AEPB
1.0000 | INHALATION_SPRAY | Freq: Every day | RESPIRATORY_TRACT | Status: DC
  Administered 2022-09-17 – 2022-09-20 (×4): 1 via RESPIRATORY_TRACT
  Filled 2022-09-17 (×2): qty 28

## 2022-09-17 MED ORDER — ENOXAPARIN SODIUM 40 MG/0.4ML IJ SOSY
40.0000 mg | PREFILLED_SYRINGE | INTRAMUSCULAR | Status: DC
  Administered 2022-09-17 – 2022-09-19 (×3): 40 mg via SUBCUTANEOUS
  Filled 2022-09-17 (×3): qty 0.4

## 2022-09-17 MED ORDER — IPRATROPIUM-ALBUTEROL 0.5-2.5 (3) MG/3ML IN SOLN
3.0000 mL | RESPIRATORY_TRACT | Status: DC | PRN
  Administered 2022-09-17: 3 mL via RESPIRATORY_TRACT

## 2022-09-17 MED ORDER — PREDNISONE 20 MG PO TABS
40.0000 mg | ORAL_TABLET | Freq: Every day | ORAL | Status: DC
  Administered 2022-09-17 – 2022-09-19 (×3): 40 mg via ORAL
  Filled 2022-09-17 (×3): qty 2

## 2022-09-17 MED ORDER — ATORVASTATIN CALCIUM 40 MG PO TABS
40.0000 mg | ORAL_TABLET | Freq: Every day | ORAL | Status: DC
  Administered 2022-09-17 – 2022-09-21 (×5): 40 mg via ORAL
  Filled 2022-09-17 (×5): qty 1

## 2022-09-17 MED ORDER — AZITHROMYCIN 250 MG PO TABS
500.0000 mg | ORAL_TABLET | Freq: Every day | ORAL | Status: AC
  Administered 2022-09-17 – 2022-09-18 (×2): 500 mg via ORAL
  Filled 2022-09-17 (×2): qty 2

## 2022-09-17 MED ORDER — IPRATROPIUM-ALBUTEROL 0.5-2.5 (3) MG/3ML IN SOLN
3.0000 mL | RESPIRATORY_TRACT | Status: DC
  Administered 2022-09-17: 3 mL via RESPIRATORY_TRACT

## 2022-09-17 MED ORDER — AMLODIPINE BESYLATE 10 MG PO TABS
10.0000 mg | ORAL_TABLET | Freq: Every day | ORAL | Status: DC
  Administered 2022-09-17 – 2022-09-21 (×5): 10 mg via ORAL
  Filled 2022-09-17 (×5): qty 1

## 2022-09-17 MED ORDER — GUAIFENESIN 100 MG/5ML PO LIQD
5.0000 mL | ORAL | Status: DC | PRN
  Administered 2022-09-17 – 2022-09-18 (×2): 5 mL via ORAL
  Filled 2022-09-17 (×2): qty 5

## 2022-09-17 MED ORDER — SENNOSIDES-DOCUSATE SODIUM 8.6-50 MG PO TABS: 1.0000 | ORAL_TABLET | Freq: Every evening | ORAL | Status: DC | PRN

## 2022-09-17 MED ORDER — UMECLIDINIUM BROMIDE 62.5 MCG/ACT IN AEPB
1.0000 | INHALATION_SPRAY | Freq: Every day | RESPIRATORY_TRACT | Status: DC
  Administered 2022-09-17 – 2022-09-20 (×4): 1 via RESPIRATORY_TRACT
  Filled 2022-09-17: qty 7

## 2022-09-17 MED ORDER — ACETAMINOPHEN 650 MG RE SUPP: 650.0000 mg | Freq: Four times a day (QID) | RECTAL | Status: DC | PRN

## 2022-09-17 MED ORDER — SALINE SPRAY 0.65 % NA SOLN
1.0000 | NASAL | Status: DC | PRN
  Administered 2022-09-17: 1 via NASAL
  Filled 2022-09-17: qty 44

## 2022-09-17 MED ORDER — SODIUM CHLORIDE 0.9 % IV SOLN
1.0000 g | INTRAVENOUS | Status: DC
  Administered 2022-09-17 – 2022-09-18 (×2): 1 g via INTRAVENOUS
  Filled 2022-09-17 (×4): qty 10

## 2022-09-17 MED ORDER — ALBUTEROL SULFATE HFA 108 (90 BASE) MCG/ACT IN AERS: 2.0000 | INHALATION_SPRAY | RESPIRATORY_TRACT | Status: DC | PRN

## 2022-09-17 MED ORDER — ALBUTEROL SULFATE (2.5 MG/3ML) 0.083% IN NEBU
2.5000 mg | INHALATION_SOLUTION | RESPIRATORY_TRACT | Status: DC | PRN
  Administered 2022-09-17 – 2022-09-18 (×3): 2.5 mg via RESPIRATORY_TRACT
  Filled 2022-09-17 (×3): qty 3

## 2022-09-17 MED ORDER — ACETAMINOPHEN 325 MG PO TABS
650.0000 mg | ORAL_TABLET | Freq: Four times a day (QID) | ORAL | Status: DC | PRN
  Administered 2022-09-17: 650 mg via ORAL
  Filled 2022-09-17: qty 2

## 2022-09-17 NOTE — Progress Notes (Signed)
   Subjective:  [name] was feeling okay this morning, better than yesterday but still experiencing some tightness. Discussed plan to continue breathing medications and start chest physiotherapy.     Objective: Vitals over previous 24hr: Vitals:   09/17/22 0225 09/17/22 0443 09/17/22 0726 09/17/22 0852  BP: (!) 140/73 138/70 (!) 147/80   Pulse: 65 63 72   Resp: 19 19 16   $ Temp: 98.1 F (36.7 C) 98.3 F (36.8 C) 98 F (36.7 C)   TempSrc: Oral Oral Oral   SpO2: 94% 96% 92% 92%  Weight:      Height:        General:      awake and alert, sitting comfortably in bed, cooperative, not in acute distress Lungs:      normal respiratory effort, breathing slightly labored, course breath sounds with mild expiratory wheezing throughout   Cardiac:      regular rate and rhythm, normal S1 and S2 Abdomen:      soft and non-distended, normoactive bowel sounds present in all four quadrants, no tenderness to palpation or guarding Neurologic:      oriented to person-place-time, moving all extremities, no gross focal deficits Psychiatric:      euthymic mood with congruent affect, intelligible speech    Assessment/Plan: [name] is a 62 year old male with a past medical history of hypertension, hyperlipidemia, asthma, COPD, and chronic back pain who presented with worsening dyspnea, now admitted for acute COPD exacerbation.    ---Acute chronic obstructive pulmonary disease exacerbation ---History of asthma Patient presented with worsening dyspnea and increased sputum production. Upon arrival, vitals notable for hypoxia and increased oxygen requirements. Chest radiograph demonstrated patchy interstitial opacities in both lung bases. Mucoid impaction identified on chest CT angiogram. Although patient had been prescribed triple therapy, he reports non-adherence over the last few months which explains his current exacerbation. Treatment with prednisone, tiotropium, and budesonide-formoterol was  initiated on first day of hospitalization. Antibiotics started to cover pneumonia.  > Umeclidinium 62.5ug q24 > Fluticasone-vilanterol 100-25ug q24 > Prednisone 39m q24, last dose 2-20 > Azithromycin 5065mq24, last dose 2-18 > Ceftriaxone 1g q24, last dose 21 > Supplemental oxygen as needed to maintain SpO2 88-92% > Chest physiotherapy and flutter valve   ---Hypertension ---Hyperlipidemia Patient has history of hypertension managed at home with amlodipine and takes atorvastatin for hyperlipidemia. Most recent lipid panel collected 01-2022 revealed LDL 67. Upon arrival, patient was hypertensive.  > Amlodipine 1056m24 > Atorvastatin 64m102m4   ---Chronic back pain Patient has history of chronic back pain previously managed with pregabalin. States that this medication was ineffective and so he self-discontinued it. Back pain has remained stable throughout hospitalization. > Acetaminophen 650mg76m   ---History of benign prostatic hyperplasia Patient has history of BPH, previously treated with tamsulosin. He reports occasionally experiencing the sensation of incomplete emptying. Denies any acute changes at this time or other urinary symptoms. History notable for remote bladder injury and had been following with urology. > Consider urology referral as outpatient    Principal Problem:   Acute exacerbation of COPD with asthma (H[facility]) [place] Living Arrangement: home alone Anticipated Discharge Location: home Barriers to Discharge: symptomatic improvement, oxygen requirement Dispo: Anticipated discharge in approximately 1-2 day(s).    Dan HRoswell NickelInternal Medicine PGY-1 Pager [phone] 5pm on weekdays and 1pm on weekends: On Call pager [phone]

## 2022-09-17 NOTE — Evaluation (Signed)
Physical Therapy Evaluation Patient Details Name: [name] MRN: [id number] DOB: 30-Jan-1961 Today's Date: 09/17/2022  History of Present Illness  Pt is a 62 y.o. male presenting 2/16 with decreased SpO2, SOB, and productive cough. PMH of asthma,/COPD, HTN, HLD, chronic back pain.  Clinical Impression  Pt presents to physical therapy close to baseline prior to hospitalization. Pt was independent and currently is independent to modified independent with out an AD.  Pt has increased O2 needs with activity from baseline; pt was currently not on O2; pt ambulated 150 ft on 3 L O2 with venturi mask with O2 sats 92%. Pt has no further need for physical therapy services at this time due to current functional status, home set up. If further needs arise please re-consult.      Recommendations for follow up therapy are one component of a multi-disciplinary discharge planning process, led by the attending physician.  Recommendations may be updated based on patient status, additional functional criteria and insurance authorization.  Follow Up Recommendations No PT follow up      Assistance Recommended at Discharge PRN  Patient can return home with the following  Assistance with cooking/housework;Assist for transportation    Equipment Recommendations None recommended by PT  Recommendations for Other Services       Functional Status Assessment Patient has not had a recent decline in their functional status     Precautions / Restrictions Precautions Precautions: None Restrictions Weight Bearing Restrictions: No      Mobility  Bed Mobility Overal bed mobility: Independent             General bed mobility comments: No difficulty with bed mobility. Patient Response: Cooperative  Transfers Overall transfer level: Independent Equipment used: None               General transfer comment: No difficulties with sit to stand without an AD    Ambulation/Gait Ambulation/Gait  assistance: Modified independent (Device/Increase time) Gait Distance (Feet): 150 Feet Assistive device: None Gait Pattern/deviations: WFL(Within Functional Limits)   Gait velocity interpretation: 1.31 - 2.62 ft/sec, indicative of limited community ambulator   General Gait Details: PT ambulated with 3L O2 with O2 sats at 92% throughout       Balance Overall balance assessment: No apparent balance deficits (not formally assessed)         Pertinent Vitals/Pain Pain Assessment Pain Assessment: No/denies pain    Home Living Family/patient expects to be discharged to:: Private residence Living Arrangements: Alone Available Help at Discharge: Friend(s) Type of Home: Other(Comment) (LIves at a  boarding house) Home Access: Stairs to enter Entrance Stairs-Rails: Right;Left;Can reach both Technical brewer of Steps: 3 Alternate Level Stairs-Number of Steps: 14 steps with small landing with rail  on the right Home Layout: Two level Home Equipment: Shower seat - built in      Prior Function Prior Level of Function : Independent/Modified Independent             Mobility Comments: ambulated without an AD ADLs Comments: Pt was performing all ADL"s independently including cooking/cleaning and walking to the grocery store and pharmacy     Hand Dominance   Dominant Hand: Right    Extremity/Trunk Assessment   Upper Extremity Assessment Upper Extremity Assessment: Overall WFL for tasks assessed    Lower Extremity Assessment Lower Extremity Assessment: Overall WFL for tasks assessed    Cervical / Trunk Assessment Cervical / Trunk Assessment: Normal  Communication   Communication: No difficulties  Cognition Arousal/Alertness: Awake/alert Behavior  During Therapy: WFL for tasks assessed/performed Overall Cognitive Status: Within Functional Limits for tasks assessed          General Comments General comments (skin integrity, edema, etc.): Pt is presenting at  baseline despite increased O2 needs.        Assessment/Plan    PT Assessment Patient does not need any further PT services  PT Problem List         PT Treatment Interventions      PT Goals (Current goals can be found in the Care Plan section)  Acute Rehab PT Goals Patient Stated Goal: To go home with O2 if needed. PT Goal Formulation: With patient Time For Goal Achievement: 10/01/22 Potential to Achieve Goals: Good    Frequency          AM-PAC PT "6 Clicks" Mobility  Outcome Measure Help needed turning from your back to your side while in a flat bed without using bedrails?: None Help needed moving from lying on your back to sitting on the side of a flat bed without using bedrails?: None Help needed moving to and from a bed to a chair (including a wheelchair)?: None Help needed standing up from a chair using your arms (e.g., wheelchair or bedside chair)?: None Help needed to walk in hospital room?: None Help needed climbing 3-5 steps with a railing? : None 6 Click Score: 24    End of Session Equipment Utilized During Treatment: Gait belt Activity Tolerance: Patient tolerated treatment well Patient left: in chair;with call bell/phone within reach Nurse Communication: Mobility status      Time: [id number] PT Time Calculation (min) (ACUTE ONLY): 30 min   Charges:   PT Evaluation $PT Eval Low Complexity: 1 Low PT Treatments $Therapeutic Activity: 8-22 mins        Tomma Rakers, DPT, [place] Office: [phone] (Secure chat preferred)   Ander Purpura 09/17/2022, 2:04 PM

## 2022-09-17 NOTE — Progress Notes (Signed)
Came to room for CPT order, pt was  up walking around room.  No rhonchi, no distress noted.  Flutter valve was given and instructed.  Pt appeared to tol well.  Pt did request a prn albuterol neb= given.  BBSH clear diminished currently.

## 2022-09-17 NOTE — Hospital Course (Addendum)
Feeling better, breathing easier. Not needing oxygen.      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 Not in exacerbation now. Compliant with his medications at this time however continues to have symptoms of bronchitis. We discussed starting chronic macrolide therapy to reduce exacerbations and potentially improve symptoms. If he continues to have symptoms on current therapy, consider biologic therapy for asthma management.   COPD-asthma overlap - uncontrolled. Not in active exacerbation --CONTINUE Symbicort 160-4.5 TWO puffs TWICE daily --CONTINUE Spiriva 2.5 mcg TWO puffs ONCE a day --START azithromycin 250 mg daily for three months -- was supposed to follow up June 2022, but did not

## 2022-09-17 NOTE — Evaluation (Signed)
Occupational Therapy Evaluation and Discharge Patient Details Name: [name] MRN: [id number] DOB: 10-18-1960 Today's Date: 09/17/2022   History of Present Illness Pt is a 62 y.o. male presenting 2/16 with decreased SpO2, SOB, and productive cough. PMH of asthma,/COPD, HTN, HLD, chronic back pain.   Clinical Impression   Pt is typically independent in ADL and mobility. Despite being on supplemental O2 Pt was modified independent in UB and LB ADL in standing and sitting. He has been up as needed in his room and meeting all ADL needs. Reviewed energy conservation strategies and Pt verblalized understanding. Pt education complete and OT will sign off at this time.       Recommendations for follow up therapy are one component of a multi-disciplinary discharge planning process, led by the attending physician.  Recommendations may be updated based on patient status, additional functional criteria and insurance authorization.   Follow Up Recommendations  No OT follow up     Assistance Recommended at Discharge PRN  Patient can return home with the following  (n/a)    Functional Status Assessment  Patient has had a recent decline in their functional status and demonstrates the ability to make significant improvements in function in a reasonable and predictable amount of time.  Equipment Recommendations  None recommended by OT    Recommendations for Other Services Other (comment) (mobility team)     Precautions / Restrictions Precautions Precautions: None Restrictions Weight Bearing Restrictions: No      Mobility Bed Mobility Overal bed mobility: Independent                  Transfers Overall transfer level: Independent                        Balance Overall balance assessment: No apparent balance deficits (not formally assessed)                                         ADL either performed or assessed with clinical judgement   ADL Overall  ADL's : Modified independent                                       General ADL Comments: Pt was up in room when OT arrived, He was able to demonstrate UB and LB ADL, transfers, standing grooming, and OT verbally reviewed energy conservation strategies with Pt.     Vision Ability to See in Adequate Light: 0 Adequate Patient Visual Report: No change from baseline Vision Assessment?: No apparent visual deficits     Perception     Praxis      Pertinent Vitals/Pain Pain Assessment Pain Assessment: No/denies pain     Hand Dominance Right   Extremity/Trunk Assessment Upper Extremity Assessment Upper Extremity Assessment: Overall WFL for tasks assessed   Lower Extremity Assessment Lower Extremity Assessment: Defer to PT evaluation   Cervical / Trunk Assessment Cervical / Trunk Assessment: Normal   Communication Communication Communication: No difficulties   Cognition Arousal/Alertness: Awake/alert Behavior During Therapy: WFL for tasks assessed/performed Overall Cognitive Status: Within Functional Limits for tasks assessed  General Comments  Pt is presenting at baseline despite increased O2 needs.    Exercises     Shoulder Instructions      Home Living Family/patient expects to be discharged to:: Private residence Living Arrangements: Alone Available Help at Discharge: Friend(s) Type of Home: Other(Comment) (LIves at a  boarding house) Home Access: Stairs to enter Technical brewer of Steps: 3 Entrance Stairs-Rails: Right;Left;Can reach both Home Layout: Two level Alternate Level Stairs-Number of Steps: 14 steps with small landing with rail  on the right Alternate Level Stairs-Rails: Right Bathroom Shower/Tub: Occupational psychologist: [place]: Shower seat - built in          Prior Functioning/Environment Prior Level of Function : Independent/Modified  Independent             Mobility Comments: ambulated without an AD ADLs Comments: Pt was performing all ADL"s independently including cooking/cleaning and walking to the grocery store and pharmacy        OT Problem List: Decreased activity tolerance;Cardiopulmonary status limiting activity      OT Treatment/Interventions:      OT Goals(Current goals can be found in the care plan section) Acute Rehab OT Goals Patient Stated Goal: get breathing back to normal and get home OT Goal Formulation: With patient Time For Goal Achievement: 10/01/22 Potential to Achieve Goals: Good  OT Frequency:      Co-evaluation              AM-PAC OT "6 Clicks" Daily Activity     Outcome Measure Help from another person eating meals?: None Help from another person taking care of personal grooming?: None Help from another person toileting, which includes using toliet, bedpan, or urinal?: None Help from another person bathing (including washing, rinsing, drying)?: None Help from another person to put on and taking off regular upper body clothing?: None Help from another person to put on and taking off regular lower body clothing?: None 6 Click Score: 24   End of Session Equipment Utilized During Treatment: Oxygen (3L) Nurse Communication: Mobility status  Activity Tolerance: Patient tolerated treatment well Patient left: in bed;with call bell/phone within reach  OT Visit Diagnosis: Other abnormalities of gait and mobility (R26.89)                [id number] OT Time Calculation (min): 10 min Charges:  OT General Charges $OT Visit: 1 Visit OT Evaluation $OT Eval Low Complexity: [place] OTR/L Acute Rehabilitation Services Office: Early 09/17/2022, 5:48 PM

## 2022-09-17 NOTE — Plan of Care (Signed)

## 2022-09-17 NOTE — H&P (Addendum)
Date: 09/17/2022               Patient Name:  [name] MRN: [id number]  DOB: [birth year] Age / Sex: 62 y.o., male   PCP: Riesa Pope, MD         Medical Service: Internal Medicine Teaching Service         Attending Physician: Dr. Sid Falcon, MD      First Contact: Dr. Roswell Nickel, MD Pager [phone]    Second Contact: Dr. Madelyn Flavors, MD Pager [phone]         After Hours (After 5p/  First Contact Pager: [phone]  weekends / holidays): Second Contact Pager: [phone]   SUBJECTIVE   Chief Complaint: SOB  History of Present Illness: [name] is a 62 yo male with asthma/COPD, hypertension, hyperlipidemia and chronic back pain presented with a productive cough and increased SOB, specifically while ambulating. He has not been able to ambulate as far without getting short of breath. Normally, he can walk 2 blocks (to the pharmacy) there and back, but now he can only walk 10-15 ft and even gets short of breath with that distance. He also has not been able to climb stairs without getting short of breath. The patient states that his chest feels tight during his coughing fits.   He states that this has been going on for the past month, but getting worse each day, and then finally he had to come to the ED last night. Of note, the patient's last asthma attack was 1 month ago. The patient notes that his breathing does feel better now that he has gotten some breathing treatments. He has been out of his Spiriva inhaler for about 1 month, since his symptom onset. His insurance does not cover his Symbicort.   He denies any fevers, chills, vomiting. Does feel nauseous. Denies any recent illnesses or any sick contacts. No recent changes in medicines, denies any pets or exposures to allergens. The patient states that he has had difficulty breathing to the point that it interferes with his sleep - he has not been able to lay flat.   The patient notes he has had to use his inhaler more often  than usual. During his asthma attacks, he uses his nebulizer and he gets these attacks about once a week for past month. Normally the patient only gets frequent asthma attacks during the summer.   Notes that he has to strain to urinate, but this has been a chronic issue for the patient.   ED Course: Brought in by EMS and found to be hypoxic into 80s requiring 5 L [place]. Was only speaking in 2-4 word sentences with pursed lips. Given solumedrol and nebs with improvement. Received dose of azithromycin and rocephin in ED. Blood cx collected. RVP negative. BMP showed K 3.4 and creatinine 1.30. CBC unremarkable. CXR revealed interstitial opacities in lung bases. CTA negative for PE but found mucoid impaction in lungs bilaterally. Still requiring supplemental O2, IMTS consulted for admission.   Past Medical History Past Medical History:  Diagnosis Date   Allergic rhinitis 04/25/2012   Asthma, chronic 10/21/2010   Followed in Pulmonary clinic/ [facility]/ [place]   - HFA 50% 01/04/2011  > 90% 02/04/2011   - PFT's wnl 02/04/2011  - no intubation before  - has been hospitalized once for asthma in 2012 - quit smoking in 2012 - allergic to aspirin and pollen   10/11/2014>>Repeat pulmonary function tests. Performed for disability application [id number],  FEV1 48%. Consistent with COPD, severe Please see results under media    Chronic pain syndrome 02/03/2011   Involving the shoulder joint  05/20/2013: right shoulder x ray >>Degenerative changes AC joint Seen by PT by 11/2013 >> improvement 03/13/2014 D/c from PT due to orange card being expired and pt cancelling appt    COPD (chronic obstructive pulmonary disease) ([place])    [place] 6/13, potentially due to ASA use.    GERD (gastroesophageal reflux disease)    Hyperlipidemia    Hypertension    Obesity (BMI 30-39.9)    Substance abuse ([place])    cocaine 35 years ago   Meds:  -albuterol neb -ventolin prn -spiriva 2 puffs a day (out of this x 1 month) -symbicort  (medicaid does not pay for)  -amlodipine 10 mg -losartan 100 mg  -atorvastatin 40 mg (has not been taking regularly) -pregabalin 100 mg tid (stopped since not helping pain)  Past Surgical History Past Surgical History:  Procedure Laterality Date   ALVEOLOPLASTY N/A 05/12/2020   Procedure: ALVEOLOPLASTY;  Surgeon: Diona Browner, DDS;  Location: [place];  Service: Oral Surgery;  Laterality: N/A;   CHOLECYSTECTOMY  07/30/89   PELVIC FRACTURE SURGERY  unknown   required surgery on his bladder   TOOTH EXTRACTION N/A 05/12/2020   Procedure: DENTAL RESTORATION/EXTRACTIONS;  Surgeon: Diona Browner, DDS;  Location: [place];  Service: Oral Surgery;  Laterality: N/A;   Social:  Lives alone in [place] 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 Johnney Ou, [facility] Substances: Quit smoking 20 years ago (2 packs/day x 10 years, 20 pack years); occasional/social alcohol use; no other substance use   Family History: brother had lung transplant (unknown cause), mother had pancreatic cancer  Allergies: Allergies as of 09/16/2022 - Review Complete 09/16/2022  Allergen Reaction Noted   Aspirin Shortness Of Breath 11/29/2010   Nsaids Shortness Of Breath 04/25/2012    Review of Systems: A complete ROS was negative except as per HPI.   OBJECTIVE:   Physical Exam: Blood pressure 126/62, pulse 69, temperature 98 F (36.7 C), temperature source Oral, resp. rate 13, SpO2 96 % on 5L.   Constitutional: awake, sitting up in bed, in no acute distress Cardiovascular: regular rate and rhythm Pulmonary/Chest: normal work of breathing on aerosolized mask 5 L (able to speak in full sentences without any distress and no accessory muscle use), mild expiratory wheezing Abdominal: soft, non-tender, non-distended MSK:  normal bulk and tone Neurological: alert & oriented x 3 Skin: warm and dry Psych: Normal mood and affect  Labs: CBC    Component Value Date/Time   WBC 8.1 09/16/2022 1915   RBC 5.49 09/16/2022 1915   HGB 15.3 09/16/2022 1915   HGB 15.6 02/12/2020 1007   HCT 49.0 09/16/2022 1915   HCT 49.0 02/12/2020 1007   PLT 265 09/16/2022 1915   PLT 246 02/12/2020 1007   MCV 89.3 09/16/2022 1915   MCV 91 02/12/2020 1007   MCH 27.9 09/16/2022 1915   MCHC 31.2 09/16/2022 1915   RDW 15.2 09/16/2022 1915   RDW 14.2 02/12/2020 1007   LYMPHSABS 3.5 09/16/2022 1915   LYMPHSABS 2.7 02/12/2020 1007   MONOABS 0.8 09/16/2022 1915   EOSABS 0.6 (H) 09/16/2022 1915   EOSABS 0.8 (H) 02/12/2020 1007   BASOSABS 0.0 09/16/2022 1915   BASOSABS 0.0 02/12/2020 1007  CMP     Component Value Date/Time   NA 138 09/16/2022 1915   NA 141 02/16/2022 0950   K 3.4 (L) 09/16/2022 1915   CL 98 09/16/2022 1915   CO2 33 (H) 09/16/2022 1915   GLUCOSE 172 (H) 09/16/2022 1915   BUN 16 09/16/2022 1915   BUN 14 02/16/2022 0950   CREATININE 1.30 (H) 09/16/2022 1915   CREATININE 1.27 02/26/2014 1501   CALCIUM 8.5 (L) 09/16/2022 1915   PROT 7.3 09/16/2022 1915   ALBUMIN 3.6 09/16/2022 1915   AST 19 09/16/2022 1915   ALT 16 09/16/2022 1915   ALKPHOS 62 09/16/2022 1915   BILITOT 0.4 09/16/2022 1915   GFRNONAA >60 09/16/2022 1915   GFRNONAA 64 02/26/2014 1501   GFRAA 77 02/12/2020 1007   GFRAA 74 02/26/2014 1501    Imaging: CT Angio Chest PE W and/or Wo Contrast CLINICAL DATA:  Shortness of breath  EXAM: CT ANGIOGRAPHY CHEST WITH CONTRAST  TECHNIQUE: Multidetector CT imaging of the chest was performed using the standard protocol during bolus administration of intravenous contrast. Multiplanar CT image reconstructions and MIPs were obtained to evaluate the vascular anatomy.  RADIATION DOSE REDUCTION: This exam was performed according to the departmental dose-optimization program which includes  automated exposure control, adjustment of the mA and/or kV according to patient size and/or use of iterative reconstruction technique.  CONTRAST:  58m OMNIPAQUE IOHEXOL 350 MG/ML SOLN  COMPARISON:  Chest x-ray from earlier in the same day, CT from 09/25/2010.  FINDINGS: Cardiovascular: Thoracic aorta demonstrates atherosclerotic calcifications without aneurysmal dilatation. Pulmonary artery shows a normal branching pattern. No pulmonary emboli are identified. Mild coronary calcifications are seen. No cardiac enlargement is noted.  Mediastinum/Nodes: Thoracic inlet is within normal limits. No mediastinal adenopathy is seen. Small bilateral hilar lymph nodes are seen stable in appearance from the prior exam and likely reactive in nature.  Lungs/Pleura: Lungs are well aerated bilaterally. Changes of mucoid impaction are noted in the right middle and to a greater degree in the right lower lobe new from the prior exam. Lesser degree of inspissated material is noted in the lower lobe on the left. No sizable parenchymal nodules are seen. No effusion is noted.  Upper Abdomen: Visualized upper abdomen is within normal limits.  Musculoskeletal: Degenerative changes of the thoracic spine are seen. No acute rib abnormality is noted.  Review of the MIP images confirms the above findings.  IMPRESSION: No evidence of pulmonary emboli.  Increase in the degree of mucoid impaction in the lungs bilaterally worse on the right than the left.  Reactive hilar lymph nodes stable from a prior exam of 20/12.  No acute abnormality is noted.  Aortic Atherosclerosis (ICD10-I70.0).  Electronically Signed   By: MInez CatalinaM.D.   On: 09/16/2022 22:21 DG Chest Portable 1 View CLINICAL DATA:  SOB  EXAM: PORTABLE CHEST 1 VIEW  COMPARISON:  Chest x-ray 06/06/2022  FINDINGS: The heart and mediastinal contours are within normal limits.  Patchy interstitial and airspace opacities along the  lung bases. No pulmonary edema. No pleural effusion. No pneumothorax.  No acute osseous abnormality.  IMPRESSION: Patchy interstitial and airspace opacities along the lung bases. Finding could represent infection/inflammation. Followup PA and lateral chest X-ray is recommended in 3-4 weeks following therapy to ensure resolution and exclude underlying malignancy.  Electronically Signed   By: MIven FinnM.D.   On: 09/16/2022 19:30  EKG: personally reviewed my interpretation is sinus rhythm, rate 82, normal axis, prolonged QTc. Prior EKG was  sinus rhythm.   ASSESSMENT & PLAN:   Assessment & Plan by Problem: Principal Problem:   Acute exacerbation of COPD with asthma ([place])   [name] is a 62 y.o. person living with a history of asthma/COPD, hypertension, hyperlipidemia and chronic back pain who presented with worsening dyspnea and hypoxic and admitted for acute COPD exacerbation on hospital day 0  Acute exacerbation of COPD with asthma Presented with worsening dyspnea and increased sputum production. Found to be hypoxic and now requiring supplemental oxygen. Afebrile and normal WBC. CXR showed patchy interstitial opacities of lung bases. CTA chest showed mucoid impaction in bilateral lungs. Patient did note that he has not been on his triple therapy (Spiriva for at least a month and off Symbicort for unknown time since insurance does not cover). Likely what contributed to this current exacerbation. Was following with pulmonology, last visit in 2022 where he was still not well-controlled. 2016 PFT showed obstructive disease. Will continue antibiotics for CAP coverage. Continue steroids and home Spiriva along with PRN duonebs. Hope to wean down supplemental oxygen and have patient work with therapy. Received dose of azithromycin and ceftriaxone in ED.  -f/u blood cultures collected in ED -prednisone 40 mg for 4 days -azithromycin for 2 days -ceftriaxone for 4 days -continue home  Spiriva or hospital equivalent -duonebs PRN -wean O2 as tolerated  -PT/OT eval   HTN HLD Hypertensive on arrival. Home medications include amlodipine 10 mg and atorvastatin 40 mg. Last LDL 67 in 01/2022.  -continue home amlodipine -continue home atorvastatin  Chronic back pain Hx of chronic back pain and was prescribed pregabalin. Patient states that medication did not help and stopped it. No acute concerns and no worsening of back pain.  -tylenol PRN   Hx of BPH Hx of BPH and was previously on tamsulosin. No longer on this medication, unclear of reason. Able to void but at times difficult fully emptying which is a chronic issue. No acute change or other urinary symptoms. Has a remote hx of bladder injury s/p pelvic fracture and was following with urology. Patient will need to continue following outpatient urology.   Diet: Heart Healthy VTE: Enoxaparin IVF: None,None Code: Full  Prior to Admission Living Arrangement: Home, living alone Anticipated Discharge Location:  TBD Barriers to Discharge: requiring supplemental oxygen  Dispo: Admit patient to Observation with expected length of stay less than 2 midnights.  Signed: Angelique Blonder, DO Internal Medicine Resident PGY-1 09/17/2022, 1:44 AM   Please contact the on-call pager after 5pm and on weekends at [phone].

## 2022-09-17 NOTE — ED Notes (Signed)
ED TO INPATIENT HANDOFF REPORT  ED Nurse Name and Phone #: Monia Pouch [phone]  S Name/Age/Gender [name] 62 y.o. male Room/Bed: 015C/015C  Code Status   Code Status: Full Code  Home/SNF/Other Home Patient oriented to: self, place, time, situation  Is this baseline? Yes      Chief Complaint Acute exacerbation of COPD with asthma ([place]) [J44.1, J45.901]  Triage Note The pt arrived by gems from home  he was siting outside when ems arrived with sats in the 64s  ems gave a hhn iv meds   iv per ems     solu=medrol 125  and mag sulfate 2 gms iv enroute   Allergies Allergies  Allergen Reactions   Aspirin Shortness Of Breath   Nsaids Shortness Of Breath    Level of Care/Admitting Diagnosis ED Disposition     ED Disposition  Admit   Condition  --   [place]: [place] [[id number]]  Level of Care: Med-Surg [16]  May place patient in observation at [facility] or [place] if equivalent level of care is available:: No  Covid Evaluation: Confirmed COVID Negative  Diagnosis: Acute exacerbation of COPD with asthma [facility]) [id number]  Admitting Physician: Sid Falcon [phone]  Attending Physician: Cresenciano Lick          B Medical/Surgery History Past Medical History:  Diagnosis Date   Allergic rhinitis 04/25/2012   Asthma, chronic 10/21/2010   Followed in Pulmonary clinic/ [facility]/ [place]   - HFA 50% 01/04/2011  > 90% 02/04/2011   - PFT's wnl 02/04/2011  - no intubation before  - has been hospitalized once for asthma in 2012 - quit smoking in 2012 - allergic to aspirin and pollen   10/11/2014>>Repeat pulmonary function tests. Performed for disability application [id number], FEV1 48%. Consistent with COPD, severe Please see results under media    Chronic pain syndrome 02/03/2011   Involving the shoulder joint  05/20/2013: right shoulder x ray >>Degenerative changes AC joint Seen by PT by 11/2013 >> improvement 03/13/2014 D/c from PT  due to orange card being expired and pt cancelling appt    COPD (chronic obstructive pulmonary disease) ([place])    exas 6/13, potentially due to ASA use.    GERD (gastroesophageal reflux disease)    Hyperlipidemia    Hypertension    Obesity (BMI 30-39.9)    Substance abuse ([place])    cocaine 35 years ago   Past Surgical History:  Procedure Laterality Date   ALVEOLOPLASTY N/A 05/12/2020   Procedure: ALVEOLOPLASTY;  Surgeon: Diona Browner, DDS;  Location: [place];  Service: Oral Surgery;  Laterality: N/A;   CHOLECYSTECTOMY  07/30/89   PELVIC FRACTURE SURGERY  unknown   required surgery on his bladder   TOOTH EXTRACTION N/A 05/12/2020   Procedure: DENTAL RESTORATION/EXTRACTIONS;  Surgeon: Diona Browner, DDS;  Location: [place];  Service: Oral Surgery;  Laterality: N/A;     A IV Location/Drains/Wounds Patient Lines/Drains/Airways Status     Active Line/Drains/Airways     Name Placement date Placement time Site Days   Peripheral IV 09/16/22 18 G Left Antecubital 09/16/22  1845  Antecubital  1   Peripheral IV 09/16/22 20 G Right Antecubital 09/16/22  2111  Antecubital  1   Incision (Closed) 05/12/20 Lip 05/12/20  1037  -- 858            Intake/Output Last 24 hours  Intake/Output Summary (Last 24 hours) at 09/17/2022  0125 Last data filed at 09/16/2022 2157 Gross per 24 hour  Intake 100 ml  Output --  Net 100 ml    Labs/Imaging Results for orders placed or performed during the hospital encounter of 09/16/22 (from the past 48 hour(s))  Resp panel by RT-PCR (RSV, Flu A&B, Covid) Anterior Nasal Swab     Status: None   Collection Time: 09/16/22  7:09 PM   Specimen: Anterior Nasal Swab  Result Value Ref Range   SARS Coronavirus 2 by RT PCR NEGATIVE NEGATIVE   Influenza A by PCR NEGATIVE NEGATIVE   Influenza B by PCR NEGATIVE NEGATIVE    Comment: (NOTE) The Xpert Xpress SARS-CoV-2/FLU/RSV plus assay is intended as an aid in the diagnosis of  influenza from Nasopharyngeal swab specimens and should not be used as a sole basis for treatment. Nasal washings and aspirates are unacceptable for Xpert Xpress SARS-CoV-2/FLU/RSV testing.  Fact Sheet for Patients: EntrepreneurPulse.com.au  Fact Sheet for Healthcare Providers: IncredibleEmployment.be  This test is not yet approved or cleared by the [place] FDA and has been authorized for detection and/or diagnosis of SARS-CoV-2 by FDA under an Emergency Use Authorization (EUA). This EUA will remain in effect (meaning this test can be used) for the duration of the COVID-19 declaration under Section 564(b)(1) of the Act, 21 U.S.C. section 360bbb-3(b)(1), unless the authorization is terminated or revoked.     Resp Syncytial Virus by PCR NEGATIVE NEGATIVE    Comment: (NOTE) Fact Sheet for Patients: EntrepreneurPulse.com.au  Fact Sheet for Healthcare Providers: IncredibleEmployment.be  This test is not yet approved or cleared by the [place] FDA and has been authorized for detection and/or diagnosis of SARS-CoV-2 by FDA under an Emergency Use Authorization (EUA). This EUA will remain in effect (meaning this test can be used) for the duration of the COVID-19 declaration under Section 564(b)(1) of the Act, 21 U.S.C. section 360bbb-3(b)(1), unless the authorization is terminated or revoked.  Performed at [facility], [place] [address], [place], [place] [address]   Brain natriuretic peptide     Status: None   Collection Time: 09/16/22  7:15 PM  Result Value Ref Range   B Natriuretic Peptide 11.2 0.0 - 100.0 pg/mL    Comment: Performed at [facility] [address] [place], [place] [address]  Comprehensive metabolic panel     Status: Abnormal   Collection Time: 09/16/22  7:15 PM  Result Value Ref Range   Sodium 138 135 - 145 mmol/L   Potassium 3.4 (L) 3.5 - 5.1 mmol/L   Chloride 98 98  - 111 mmol/L   CO2 33 (H) 22 - 32 mmol/L   Glucose, Bld 172 (H) 70 - 99 mg/dL    Comment: Glucose reference range applies only to samples taken after fasting for at least 8 hours.   BUN 16 8 - 23 mg/dL   Creatinine, Ser 1.30 (H) 0.61 - 1.24 mg/dL   Calcium 8.5 (L) 8.9 - 10.3 mg/dL   Total Protein 7.3 6.5 - 8.1 g/dL   Albumin 3.6 3.5 - 5.0 g/dL   AST 19 15 - 41 U/L   ALT 16 0 - 44 U/L   Alkaline Phosphatase 62 38 - 126 U/L   Total Bilirubin 0.4 0.3 - 1.2 mg/dL   GFR, Estimated >60 >60 mL/min    Comment: (NOTE) Calculated using the CKD-EPI Creatinine Equation (2021)    Anion gap 7 5 - 15    Comment: Performed at [facility], [address] [place], [place] [address]  Troponin I (High Sensitivity)     Status: None   Collection Time: 09/16/22  7:15 PM  Result Value Ref Range   Troponin I (High Sensitivity) 8 <18 ng/L    Comment: (NOTE) Elevated high sensitivity troponin I (hsTnI) values and significant  changes across serial measurements may suggest ACS but many other  chronic and acute conditions are known to elevate hsTnI results.  Refer to the "Links" section for chest pain algorithms and additional  guidance. Performed at [facility], [place] [address], [place], [place] [address]   CBC with Differential     Status: Abnormal   Collection Time: 09/16/22  7:15 PM  Result Value Ref Range   WBC 8.1 4.0 - 10.5 K/uL   RBC 5.49 4.22 - 5.81 MIL/uL   Hemoglobin 15.3 13.0 - 17.0 g/dL   HCT 49.0 39.0 - 52.0 %   MCV 89.3 80.0 - 100.0 fL   MCH 27.9 26.0 - 34.0 pg   MCHC 31.2 30.0 - 36.0 g/dL   RDW 15.2 11.5 - 15.5 %   Platelets 265 150 - 400 K/uL   nRBC 0.0 0.0 - 0.2 %   Neutrophils Relative % 40 %   Neutro Abs 3.2 1.7 - 7.7 K/uL   Lymphocytes Relative 43 %   Lymphs Abs 3.5 0.7 - 4.0 K/uL   Monocytes Relative 10 %   Monocytes Absolute 0.8 0.1 - 1.0 K/uL   Eosinophils Relative 7 %   Eosinophils Absolute 0.6 (H) 0.0 - 0.5 K/uL   Basophils Relative 0 %   Basophils  Absolute 0.0 0.0 - 0.1 K/uL   Immature Granulocytes 0 %   Abs Immature Granulocytes 0.01 0.00 - 0.07 K/uL    Comment: Performed at [facility] [address], [place], [place] [address]  Troponin I (High Sensitivity)     Status: None   Collection Time: 09/16/22  9:00 PM  Result Value Ref Range   Troponin I (High Sensitivity) 8 <18 ng/L    Comment: (NOTE) Elevated high sensitivity troponin I (hsTnI) values and significant  changes across serial measurements may suggest ACS but many other  chronic and acute conditions are known to elevate hsTnI results.  Refer to the "Links" section for chest pain algorithms and additional  guidance. Performed at [facility], [place] [address] [place],  [address]    CT Angio Chest PE W and/or Wo Contrast  Result Date: 09/16/2022 CLINICAL DATA:  Shortness of breath EXAM: CT ANGIOGRAPHY CHEST WITH CONTRAST TECHNIQUE: Multidetector CT imaging of the chest was performed using the standard protocol during bolus administration of intravenous contrast. Multiplanar CT image reconstructions and MIPs were obtained to evaluate the vascular anatomy. RADIATION DOSE REDUCTION: This exam was performed according to the departmental dose-optimization program which includes automated exposure control, adjustment of the mA and/or kV according to patient size and/or use of iterative reconstruction technique. CONTRAST:  68m OMNIPAQUE IOHEXOL 350 MG/ML SOLN COMPARISON:  Chest x-ray from earlier in the same day, CT from 09/25/2010. FINDINGS: Cardiovascular: Thoracic aorta demonstrates atherosclerotic calcifications without aneurysmal dilatation. Pulmonary artery shows a normal branching pattern. No pulmonary emboli are identified. Mild coronary calcifications are seen. No cardiac enlargement is noted. Mediastinum/Nodes: Thoracic inlet is within normal limits. No mediastinal adenopathy is seen. Small bilateral hilar lymph nodes are seen stable in appearance from the  prior exam and likely reactive in nature. Lungs/Pleura: Lungs are well aerated bilaterally. Changes of mucoid  impaction are noted in the right middle and to a greater degree in the right lower lobe new from the prior exam. Lesser degree of inspissated material is noted in the lower lobe on the left. No sizable parenchymal nodules are seen. No effusion is noted. Upper Abdomen: Visualized upper abdomen is within normal limits. Musculoskeletal: Degenerative changes of the thoracic spine are seen. No acute rib abnormality is noted. Review of the MIP images confirms the above findings. IMPRESSION: No evidence of pulmonary emboli. Increase in the degree of mucoid impaction in the lungs bilaterally worse on the right than the left. Reactive hilar lymph nodes stable from a prior exam of 20/12. No acute abnormality is noted. Aortic Atherosclerosis (ICD10-I70.0). Electronically Signed   By: Inez Catalina M.D.   On: 09/16/2022 22:21   DG Chest Portable 1 View  Result Date: 09/16/2022 CLINICAL DATA:  SOB EXAM: PORTABLE CHEST 1 VIEW COMPARISON:  Chest x-ray 06/06/2022 FINDINGS: The heart and mediastinal contours are within normal limits. Patchy interstitial and airspace opacities along the lung bases. No pulmonary edema. No pleural effusion. No pneumothorax. No acute osseous abnormality. IMPRESSION: Patchy interstitial and airspace opacities along the lung bases. Finding could represent infection/inflammation. Followup PA and lateral chest X-ray is recommended in 3-4 weeks following therapy to ensure resolution and exclude underlying malignancy. Electronically Signed   By: Iven Finn M.D.   On: 09/16/2022 19:30    Pending Labs Unresulted Labs (From admission, onward)     Start     Ordered   09/17/22 0500  HIV Antibody (routine testing w rflx)  (HIV Antibody (Routine testing w reflex) panel)  Tomorrow morning,   R        09/17/22 0118   09/17/22 [id number]  Basic metabolic panel  Tomorrow morning,   R        09/17/22  0118   09/17/22 0500  CBC  Tomorrow morning,   R        09/17/22 0118   09/16/22 2052  Culture, blood (routine x 2)  BLOOD CULTURE X 2,   R (with STAT occurrences)      09/16/22 2052            Vitals/Pain Today's Vitals   09/17/22 0015 09/17/22 0030 09/17/22 0045 09/17/22 0100  BP: 121/68 119/62 136/73   Pulse: 62 66 72   Resp: 18 10 14   $ Temp:      TempSrc:      SpO2: 96% 99% 97%   Weight:    109.8 kg  Height:    5' 11"$  (1.803 m)    Isolation Precautions No active isolations  Medications Medications  enoxaparin (LOVENOX) injection 40 mg (has no administration in time range)  acetaminophen (TYLENOL) tablet 650 mg (has no administration in time range)    Or  acetaminophen (TYLENOL) suppository 650 mg (has no administration in time range)  senna-docusate (Senokot-S) tablet 1 tablet (has no administration in time range)  Tiotropium Bromide Monohydrate AERS 2 puff (has no administration in time range)  amLODipine (NORVASC) tablet 10 mg (has no administration in time range)  atorvastatin (LIPITOR) tablet 40 mg (has no administration in time range)  ipratropium-albuterol (DUONEB) 0.5-2.5 (3) MG/3ML nebulizer solution 3 mL (has no administration in time range)  azithromycin (ZITHROMAX) tablet 500 mg (has no administration in time range)  cefTRIAXone (ROCEPHIN) 1 g in sodium chloride 0.9 % 100 mL IVPB (has no administration in time range)  predniSONE (DELTASONE) tablet 40 mg (has no administration in  time range)  ipratropium-albuterol (DUONEB) 0.5-2.5 (3) MG/3ML nebulizer solution 3 mL (3 mLs Nebulization Given 09/16/22 1927)  ipratropium-albuterol (DUONEB) 0.5-2.5 (3) MG/3ML nebulizer solution 3 mL (3 mLs Nebulization Given 09/16/22 2004)  cefTRIAXone (ROCEPHIN) 1 g in sodium chloride 0.9 % 100 mL IVPB (0 g Intravenous Stopped 09/16/22 2157)  azithromycin (ZITHROMAX) 500 mg in sodium chloride 0.9 % 250 mL IVPB (0 mg Intravenous Stopped 09/16/22 2243)  iohexol (OMNIPAQUE) 350 MG/ML  injection 75 mL (75 mLs Intravenous Contrast Given 09/16/22 2209)    Mobility walks with person assist     Focused Assessments Pulmonary Assessment Handoff:  Lung sounds: L Breath Sounds: Rhonchi R Breath Sounds: Rhonchi O2 Device: Aerosol Mask      R Recommendations: See Admitting Provider Note  Report given to:   Additional Notes: None

## 2022-09-18 DIAGNOSIS — Z87891 Personal history of nicotine dependence: Secondary | ICD-10-CM | POA: Diagnosis not present

## 2022-09-18 DIAGNOSIS — J441 Chronic obstructive pulmonary disease with (acute) exacerbation: Secondary | ICD-10-CM | POA: Diagnosis not present

## 2022-09-18 DIAGNOSIS — J45901 Unspecified asthma with (acute) exacerbation: Secondary | ICD-10-CM | POA: Diagnosis not present

## 2022-09-18 MED ORDER — DM-GUAIFENESIN ER 30-600 MG PO TB12
1.0000 | ORAL_TABLET | Freq: Two times a day (BID) | ORAL | Status: DC
  Administered 2022-09-18 – 2022-09-21 (×7): 1 via ORAL
  Filled 2022-09-18 (×7): qty 1

## 2022-09-18 MED ORDER — LOSARTAN POTASSIUM 50 MG PO TABS
100.0000 mg | ORAL_TABLET | Freq: Every day | ORAL | Status: DC
  Administered 2022-09-18 – 2022-09-21 (×4): 100 mg via ORAL
  Filled 2022-09-18 (×4): qty 2

## 2022-09-18 MED ORDER — IPRATROPIUM-ALBUTEROL 0.5-2.5 (3) MG/3ML IN SOLN
3.0000 mL | RESPIRATORY_TRACT | Status: DC
  Administered 2022-09-18 – 2022-09-20 (×11): 3 mL via RESPIRATORY_TRACT
  Filled 2022-09-18 (×11): qty 3

## 2022-09-18 NOTE — Progress Notes (Signed)
   Subjective:  [name] is feeling fine this morning. Had some coughing last night and this morning, but feels a little better than yesterday.   Objective: Vitals over previous 24hr: Vitals:   09/18/22 0616 09/18/22 0815 09/18/22 0847 09/18/22 0851  BP: (!) 165/80 (!) 155/76    Pulse: 92 88    Resp: 16 17    Temp: 98.8 F (37.1 C) 99 F (37.2 C)    TempSrc: Oral Oral    SpO2: 95% 95% 91% 91%  Weight:      Height:        General: elderly male, sitting in bed, NAD. CV: normal rate and regular rhythm, no m/r/g. Pulm: Slightly labored breathing with wheezing noted bilaterally, air entry improved from yesterday. Saturating >92% on 3L via Venturi mask (using this as unable to tolerate [name] due to nasal polyps). Abdomen: soft, ND, NT, normoactive bowel sounds. Neuro: AAOx3, no focal deficits. Psych: normal mood, calm and cooperative.   Assessment/Plan: [name] is a 62 year old male with a past medical history of hypertension, hyperlipidemia, asthma, COPD, and chronic back pain who presented with worsening dyspnea, now admitted for acute COPD exacerbation.   ---Acute chronic obstructive pulmonary disease exacerbation ---History of asthma Patient presented with worsening dyspnea and increased sputum production. Upon arrival, vitals notable for hypoxia and increased oxygen requirements. Chest radiograph demonstrated patchy interstitial opacities in both lung bases. Mucoid impaction identified on chest CT angiogram. Although patient had been prescribed triple therapy, he reports non-adherence over the last few months which explains his current exacerbation. He continues to have wheezing on exam, although improved from yesterday. Will continue with current treatment plan and reassess tomorrow. -incruse ellipta -breo ellipta -duonebs q4h -prednisone 71m daily (day 3/5) -last dose of azithromycin today -rocephin (day 3/5) -chest PT and flutter valve -supplemental O2 as needed to maintain  SpO2 88-92% given COPD   ---Hypertension ---Hyperlipidemia Patient has history of hypertension managed at home with amlodipine and losartan. Takes atorvastatin for hyperlipidemia. Most recent lipid panel collected 01-2022 revealed LDL 67. > Amlodipine 154mq24 > restarted home cozaar 10067maily > Atorvastatin 43m63m4   ---Chronic back pain Patient has history of chronic back pain previously managed with pregabalin. States that this medication was ineffective and so he self-discontinued it. Back pain has remained stable throughout hospitalization. > Acetaminophen 650mg64m   ---History of benign prostatic hyperplasia Patient has history of BPH, previously treated with tamsulosin. He reports occasionally experiencing the sensation of incomplete emptying. Denies any acute changes at this time or other urinary symptoms. History notable for remote bladder injury and had been following with urology. > Consider urology referral as outpatient    Principal Problem:   Acute exacerbation of COPD with asthma (HCC) [place] to Admission Living Arrangement: home alone Anticipated Discharge Location: home Barriers to Discharge: symptomatic improvement, oxygen requirement Dispo: Anticipated discharge in approximately 1-2 day(s).   SagarVirl AxeInternal Medicine PGY-3 Pager [phone] 5pm on weekdays and 1pm on weekends: On Call pager [phone]

## 2022-09-19 DIAGNOSIS — J441 Chronic obstructive pulmonary disease with (acute) exacerbation: Secondary | ICD-10-CM | POA: Diagnosis not present

## 2022-09-19 DIAGNOSIS — Z87891 Personal history of nicotine dependence: Secondary | ICD-10-CM | POA: Diagnosis not present

## 2022-09-19 DIAGNOSIS — J45901 Unspecified asthma with (acute) exacerbation: Secondary | ICD-10-CM | POA: Diagnosis not present

## 2022-09-19 LAB — BASIC METABOLIC PANEL
Anion gap: 8 (ref 5–15)
BUN: 13 mg/dL (ref 8–23)
CO2: 32 mmol/L (ref 22–32)
Calcium: 8.5 mg/dL — ABNORMAL LOW (ref 8.9–10.3)
Chloride: 99 mmol/L (ref 98–111)
Creatinine, Ser: 1.01 mg/dL (ref 0.61–1.24)
GFR, Estimated: >60 mL/min (ref >60)
Glucose, Bld: 104 mg/dL — ABNORMAL HIGH (ref 70–99)
Potassium: 3.8 mmol/L (ref 3.5–5.1)
Sodium: 139 mmol/L (ref 135–145)

## 2022-09-19 MED ORDER — PREDNISONE 20 MG PO TABS
40.0000 mg | ORAL_TABLET | Freq: Once | ORAL | Status: AC
  Administered 2022-09-19: 40 mg via ORAL
  Filled 2022-09-19: qty 2

## 2022-09-19 MED ORDER — PREDNISONE 50 MG PO TABS
60.0000 mg | ORAL_TABLET | Freq: Every day | ORAL | Status: AC
  Administered 2022-09-20: 60 mg via ORAL
  Filled 2022-09-19: qty 1

## 2022-09-19 NOTE — Progress Notes (Addendum)
This chaplain responded to the spiritual care consult for creating/updating the Pt. Advance Directive.   The Pt. is awake and willing to participate in AD education: HCPOA and Living Will. The Pt. answered  clarifying questions and has chosen to review the document with his sister, paging the chaplain through the RN for F/U if needed. The Pt. has an incomplete copy of an AD.  The chaplain understands the Pt. sister-Tracy Tata is the Pt. surrogate decision maker and also the Pt. choice for HCPOA.   Chaplain Sallyanne Kuster [phone]

## 2022-09-19 NOTE — Progress Notes (Signed)
   Subjective:  [name] was feeling okay this morning, better than yesterday but still experiencing some tightness and wheezing. Discussed plan to continue breathing medications and increase dose of prednisone for better symptom control. Patient expressed understanding of and agreement with this plan.    Objective: Vitals over previous 24hr: Vitals:   09/19/22 0037 09/19/22 0418 09/19/22 0801 09/19/22 0820  BP:   (!) 140/78   Pulse:   71 64  Resp:   16   Temp:   98.9 F (37.2 C)   TempSrc:   Oral   SpO2: 96% 95% 91% 90%  Weight:      Height:        General:      awake and alert, sitting comfortably in bed, cooperative, not in acute distress Lungs:      normal respiratory effort, breathing slightly labored, course breath sounds with mild expiratory wheezing throughout   Cardiac:      regular rate and rhythm, normal S1 and S2 Abdomen:      soft and non-distended, no tenderness to palpation or guarding Neurologic:      oriented to person-place-time, moving all extremities, no gross focal deficits Psychiatric:      euthymic mood with congruent affect, intelligible speech    Assessment/Plan: [name] is a 62 year old male with a past medical history of hypertension, hyperlipidemia, asthma, COPD, and chronic back pain who presented with worsening dyspnea, now admitted for acute COPD exacerbation.    ---Acute chronic obstructive pulmonary disease exacerbation ---History of asthma Patient presented with worsening dyspnea and increased sputum production. Upon arrival, vitals notable for hypoxia and increased oxygen requirements. Chest radiograph demonstrated patchy interstitial opacities in both lung bases. Mucoid impaction identified on chest CT angiogram. Although patient had been prescribed triple therapy, he reports non-adherence over the last few months which explains his current exacerbation. Treatment with prednisone, ipratropium-albuterol, umeclidinium, and  fluticasone-vilanterol was initiated. Antibiotics started to cover possible pneumonia, chest CT reassuring and discontinued on 2-19. After several days of persistent wheezing, dose and duration of prednisone were increased. > Ipratropium-albuterol 0.5-2.36m q4 > Umeclidinium 62.5ug q24 > Fluticasone-vilanterol 100-25ug q24 > Prednisone 858mx1 then 6074m1 on 2-20 > Supplemental oxygen as needed to maintain SpO2 88-92% > Chest physiotherapy and flutter valve   ---Hypertension ---Hyperlipidemia Patient has history of hypertension managed at home with amlodipine and takes atorvastatin for hyperlipidemia. Most recent lipid panel collected 01-2022 revealed LDL 67. Upon arrival, patient was hypertensive.  > Amlodipine 11m75m4 > Atorvastatin 40mg62m   ---Chronic back pain Patient has history of chronic back pain previously managed with pregabalin. States that this medication was ineffective and so he self-discontinued it. Back pain has remained stable throughout hospitalization. > Acetaminophen 650mg 55m  ---History of benign prostatic hyperplasia Patient has history of BPH, previously treated with tamsulosin. He reports occasionally experiencing the sensation of incomplete emptying. Denies any acute changes at this time or other urinary symptoms. History notable for remote bladder injury and had been following with urology. > Consider urology referral as outpatient    Principal Problem:   Acute exacerbation of COPD with asthma (HCC)  [place] to Admission Living Arrangement: home alone Anticipated Discharge Location: home Barriers to Discharge: symptomatic improvement, oxygen requirement Dispo: Anticipated discharge in approximately 1-2 day(s).    Dan [facility] Nickelnternal Medicine PGY-1 Pager [phone] 5pm on weekdays and 1pm on weekends: On Call pager [phone]

## 2022-09-19 NOTE — Progress Notes (Signed)
   09/19/22 1100  Mobility  Activity Ambulated independently in hallway  Level of Assistance Independent  Assistive Device None  Distance Ambulated (ft) 250 ft  Activity Response Tolerated well  Mobility Referral Yes  $Mobility charge 1 Mobility   Mobility Specialist Progress Note  Pt in BR and agreeable. X1 standing break d/t feeling SOB. Returned EOB w/ all needs met and call bell in reach.   Lucious Groves Mobility Specialist  Please contact via SecureChat or Rehab office at [phone]

## 2022-09-19 NOTE — Progress Notes (Signed)
Nurse requested Mobility Specialist to perform oxygen saturation test with pt which includes removing pt from oxygen both at rest and while ambulating.  Below are the results from that testing.     Patient Saturations on Room Air at Rest = spO2 94%  Patient Saturations on Room Air while Ambulating = sp02 85% .  Rested and performed pursed lip breathing for 1 minute with sp02 at 85%.  Patient Saturations on 3 Liters of oxygen while Ambulating = sp02 93%  At end of testing pt left in room on 3  Liters of oxygen.  Reported results to nurse.

## 2022-09-20 DIAGNOSIS — Z87891 Personal history of nicotine dependence: Secondary | ICD-10-CM | POA: Diagnosis not present

## 2022-09-20 DIAGNOSIS — J441 Chronic obstructive pulmonary disease with (acute) exacerbation: Secondary | ICD-10-CM | POA: Diagnosis not present

## 2022-09-20 DIAGNOSIS — J45901 Unspecified asthma with (acute) exacerbation: Secondary | ICD-10-CM | POA: Diagnosis not present

## 2022-09-20 MED ORDER — IPRATROPIUM-ALBUTEROL 0.5-2.5 (3) MG/3ML IN SOLN: 3.0000 mL | RESPIRATORY_TRACT | Status: DC | PRN

## 2022-09-20 MED ORDER — PREDNISONE 50 MG PO TABS
60.0000 mg | ORAL_TABLET | Freq: Every day | ORAL | Status: AC
  Administered 2022-09-21: 60 mg via ORAL
  Filled 2022-09-20: qty 1

## 2022-09-20 NOTE — Progress Notes (Signed)
Subjective:  Mr Perlick was feeling okay this morning, better than yesterday but still experiencing some tightness and wheezing. Discussed plan to continue breathing medications and maintain increased dose of prednisone for better symptom control. Patient eager to return home, but expressed understanding of and agreement with this plan.    Objective: Vitals over previous 24hr: Vitals:   09/20/22 0336 09/20/22 0541 09/20/22 0901 09/20/22 1854  BP:  (!) 141/74 133/67 134/71  Pulse: 76 62 65 70  Resp: 18 19 18 17  $ Temp:  98.6 F (37 C) 97.7 F (36.5 C) 98.7 F (37.1 C)  TempSrc:  Oral Oral Oral  SpO2: 96% 91% 95% 93%  Weight:      Height:        General:      awake and alert, sitting comfortably in bed, cooperative, not in acute distress Lungs:      normal respiratory effort, breathing slightly labored, course breath sounds with mild expiratory wheezing throughout   Cardiac:      regular rate and rhythm, normal S1 and S2 Abdomen:      soft and non-distended, no tenderness to palpation or guarding Neurologic:      oriented to person-place-time, moving all extremities, no gross focal deficits Psychiatric:      euthymic mood with congruent affect, intelligible speech    Assessment/Plan: Mr Cartee is a 62 year old male with a past medical history of hypertension, hyperlipidemia, asthma, COPD, and chronic back pain who presented with worsening dyspnea, now admitted for acute COPD exacerbation.   ---Acute chronic obstructive pulmonary disease exacerbation ---History of asthma Patient presented with worsening dyspnea and increased sputum production. Upon arrival, vitals notable for hypoxia and increased oxygen requirements. Chest radiograph demonstrated patchy interstitial opacities in both lung bases. Mucoid impaction identified on chest CT angiogram. Although patient had been prescribed triple therapy, he reports non-adherence over the last few months which explains his current  exacerbation. Treatment with prednisone, ipratropium-albuterol, umeclidinium, and fluticasone-vilanterol was initiated. Antibiotics started to cover possible pneumonia, chest CT reassuring and discontinued on 2-19. After several days of persistent wheezing, dose and duration of prednisone were increased. Wheezing has improved slightly since this adjustment, current prednisone dose is 5m. > Ipratropium-albuterol 0.5-2.536mq4 > Umeclidinium 62.5ug q24 > Fluticasone-vilanterol 100-25ug q24 > Prednisone 6061m1 then 39m82m on 2-21 > Supplemental oxygen as needed to maintain SpO2 88-92% > Chest physiotherapy and flutter valve   ---Hypertension ---Hyperlipidemia Patient has history of hypertension managed at home with amlodipine and takes atorvastatin for hyperlipidemia. Most recent lipid panel collected 01-2022 revealed LDL 67. Upon arrival, patient was hypertensive.  > Amlodipine 10mg8m > Atorvastatin 40mg 34m  ---Chronic back pain Patient has history of chronic back pain previously managed with pregabalin. States that this medication was ineffective and so he self-discontinued it. Back pain has remained stable throughout hospitalization. > Acetaminophen 650mg q39m ---History of benign prostatic hyperplasia Patient has history of BPH, previously treated with tamsulosin. He reports occasionally experiencing the sensation of incomplete emptying. Denies any acute changes at this time or other urinary symptoms. History notable for remote bladder injury and had been following with urology. > Consider urology referral as outpatient    Principal Problem:   Acute exacerbation of COPD with asthma (HCC)   [place] to Admission Living Arrangement: home alone Anticipated Discharge Location: home Barriers to Discharge: symptomatic improvement, oxygen requirement Dispo: Anticipated discharge in approximately 1-2 day(s).    [facility] PGY-1 Pager  [phone]  After 5pm on  weekdays and 1pm on weekends: On Call pager [phone]

## 2022-09-20 NOTE — Progress Notes (Signed)
   09/20/22 1105  Mobility  Activity Ambulated independently in hallway  Level of Assistance Independent  Assistive Device None  Distance Ambulated (ft) 250 ft  Activity Response Tolerated well  Mobility Referral Yes  $Mobility charge 1 Mobility   Mobility Specialist Progress Note  Pt was in bed and agreeable. X1 standing break d/t feeling SOB. Returned EOB w/ all needs met and call bell in reach.  Lucious Groves Mobility Specialist  Please contact via SecureChat or Rehab office at [phone]

## 2022-09-20 NOTE — TOC CM/SW Note (Signed)
  Transition of Care [facility]) Screening Note   Patient Details  Name: [name] Date of Birth: Jun 24, 1961   Secure chatted MD regarding plan : patient not ready for discharge today.  Will recheck ambulatory O2 sats to see if he will need home oxygen prior to discharge   Transition of Care Department [facility])  will continue to monitor patient advancement through interdisciplinary progression rounds. If new patient transition needs arise, please place a TOC consult.

## 2022-09-21 ENCOUNTER — Other Ambulatory Visit: Payer: Self-pay | Admitting: Student

## 2022-09-21 ENCOUNTER — Other Ambulatory Visit (HOSPITAL_COMMUNITY): Payer: Self-pay

## 2022-09-21 DIAGNOSIS — J441 Chronic obstructive pulmonary disease with (acute) exacerbation: Secondary | ICD-10-CM | POA: Diagnosis not present

## 2022-09-21 DIAGNOSIS — Z87891 Personal history of nicotine dependence: Secondary | ICD-10-CM | POA: Diagnosis not present

## 2022-09-21 DIAGNOSIS — J45901 Unspecified asthma with (acute) exacerbation: Secondary | ICD-10-CM | POA: Diagnosis not present

## 2022-09-21 LAB — CULTURE, BLOOD (ROUTINE X 2)
Culture: NO GROWTH
Culture: NO GROWTH
Special Requests: ADEQUATE
Special Requests: ADEQUATE

## 2022-09-21 MED ORDER — PREDNISONE 10 MG PO TABS
ORAL_TABLET | ORAL | 0 refills | Status: AC
  Filled 2022-09-21: qty 14, 6d supply, fill #0

## 2022-09-21 NOTE — Discharge Summary (Addendum)
Name: KESAUN LAMPO MRN: [id number] DOB: [birth year] 62 y.o. PCP: Riesa Pope, MD  Date of Admission: 09/16/2022  7:01 PM Date of Discharge: 09/21/2022 Attending Physician: Aldine Contes, MD  Discharge Diagnosis: Principal Problem:   Acute exacerbation of COPD with asthma [facility])     Discharge Medications: Allergies as of 09/21/2022       Reactions   Aspirin Shortness Of Breath   Nsaids Shortness Of Breath        Medication List     STOP taking these medications    budesonide-formoterol 160-4.5 MCG/ACT inhaler Commonly known as: Symbicort   Spiriva Respimat 2.5 MCG/ACT Aers Generic drug: Tiotropium Bromide Monohydrate       TAKE these medications    albuterol (2.5 MG/3ML) 0.083% nebulizer solution Commonly known as: PROVENTIL Take 3 mLs (2.5 mg total) by nebulization every 6 (six) hours as needed for wheezing or shortness of breath. What changed: Another medication with the same name was removed. Continue taking this medication, and follow the directions you see here.   amLODipine 10 MG tablet Commonly known as: NORVASC TAKE 1 TABLET(10 MG) BY MOUTH DAILY What changed: See the new instructions.   atorvastatin 40 MG tablet Commonly known as: Lipitor Take 1 tablet (40 mg total) by mouth daily.   fluticasone 50 MCG/ACT nasal spray Commonly known as: FLONASE SHAKE LIQUID AND USE 1 SPRAY IN EACH NOSTRIL DAILY What changed: See the new instructions.   losartan 100 MG tablet Commonly known as: COZAAR Take 1 tablet (100 mg total) by mouth daily.   predniSONE 10 MG tablet Commonly known as: DELTASONE Take 4 tablets (40 mg total) by mouth daily for 2 days, THEN 2 tablets (20 mg total) daily for 2 days, THEN 1 tablet (10 mg total) daily for 2 days. Start taking on: September 21, 2022   pregabalin 100 MG capsule Commonly known as: LYRICA TAKE 1 CAPSULE(100 MG) BY MOUTH THREE TIMES DAILY         Disposition and follow-up:    Mr.Shan A Salmons was  discharged from [facility] in Stable condition.  At the hospital follow up visit please address:   1. COPD exacerbation: discharged with longer prednisone taper (26m x2 days, 219mx2 days, 105m2 days, then stop). Sending with leftover incruse ellipta and breo ellipta. [facility] stable. He will need an appropriate maintenance regimen that he can afford at follow up.  4. Consider refer to urology for benign prostatic hyperplasia  5. Labs / imaging needed at time of follow-up: none  6.  Pending labs / tests needing follow-up: none    Follow-up Appointments:  Internal Medicine Clinic, exact date pending   Hospital Course by problem list:  Mr LeeRafferty a [age]ar old male with a past medical history of hypertension, hyperlipidemia, asthma, COPD, and chronic back pain who presented with worsening dyspnea, now admitted for acute COPD exacerbation.     ---Acute chronic obstructive pulmonary disease exacerbation ---History of asthma Patient presented with worsening dyspnea and increased sputum production. Upon arrival, vitals notable for hypoxia and increased oxygen requirements. Chest radiograph demonstrated patchy interstitial opacities in both lung bases. Mucoid impaction identified on chest CT angiogram. Although patient had been prescribed triple therapy, he reports non-adherence over the last few months which explains his current exacerbation. Treatment with prednisone, ipratropium-albuterol, umeclidinium, and fluticasone-vilanterol was initiated. Antibiotics started to cover possible pneumonia, chest CT reassuring, so discontinued on 2-19. After  several days of persistent wheezing, dose and duration of prednisone were increased. Wheezing has improved since this adjustment. Discharged with prednisone taper from 13m x2 days, 262mx2 days, 1071m2, then stop (over six days), umeclidinium, and fluticasone-vilanterol.      ---Hypertension ---Hyperlipidemia Patient has history of hypertension managed at home with amlodipine and takes atorvastatin for hyperlipidemia. Most recent lipid panel collected 01-2022 revealed LDL 67. Upon arrival, patient was hypertensive and amlodipine continued throughout hospitalization.     ---Chronic back pain Patient has history of chronic back pain previously managed with pregabalin. States that this medication was ineffective and so he self-discontinued it. Back pain remained stable throughout hospitalization.     ---History of benign prostatic hyperplasia Patient has history of BPH, previously treated with tamsulosin. He reports occasionally experiencing the sensation of incomplete emptying. Denies any acute changes at this time or other urinary symptoms. History notable for remote bladder injury and had been following with urology.      Discharge Exam:    BP (!) 147/75 (BP Location: Right Arm)   Pulse 65   Temp 98.3 F (36.8 C) (Oral)   Resp 17   Ht 5' 11"$  (1.803 m)   Wt 109.8 kg   SpO2 94%   BMI 33.75 kg/m   Subjective: Patient reports feeling better than yesterday, chest tightness still present a little. Discussed improvement in symptoms and plan for discharge later today. Communicated importance of continuing to take medications at home including the prednisone for another six days.  General:                       awake and alert, sitting comfortably in bed, cooperative, not in acute distress Lungs:                          normal respiratory effort, breathing slightly labored, mild expiratory wheezing throughout   Cardiac:                        regular rate and rhythm, normal S1 and S2 Abdomen:                     soft and non-distended, no tenderness to palpation or guarding Neurologic:                   oriented to person-place-time, moving all extremities, no gross focal deficits Psychiatric:                   euthymic mood with congruent affect,  intelligible speech    Pertinent Labs, Studies, and Procedures:   Labs:    Latest Ref Rng & Units 09/17/2022    3:21 AM 09/16/2022    7:15 PM 02/12/2020   10:07 AM  CBC  WBC 4.0 - 10.5 K/uL 4.8  8.1  7.1   Hemoglobin 13.0 - 17.0 g/dL 15.0  15.3  15.6   Hematocrit 39.0 - 52.0 % 47.8  49.0  49.0   Platelets 150 - 400 K/uL 260  265  246       Latest Ref Rng & Units 09/19/2022    6:47 AM 09/17/2022    3:21 AM 09/16/2022    7:15 PM  CMP  Glucose 70 - 99 mg/dL 104  150  172   BUN 8 - 23 mg/dL 13  14  16   $ Creatinine 0.61 - 1.24 mg/dL 1.01  1.23  1.30   Sodium 135 - 145 mmol/L 139  139  138   Potassium 3.5 - 5.1 mmol/L 3.8  3.8  3.4   Chloride 98 - 111 mmol/L 99  99  98   CO2 22 - 32 mmol/L 32  31  33   Calcium 8.9 - 10.3 mg/dL 8.5  8.4  8.5   Total Protein 6.5 - 8.1 g/dL   7.3   Total Bilirubin 0.3 - 1.2 mg/dL   0.4   Alkaline Phos 38 - 126 U/L   62   AST 15 - 41 U/L   19   ALT 0 - 44 U/L   16     ______________________  Imaging:  CT Angio Chest PE W and/or Wo Contrast Result Date: 09/16/2022 IMPRESSION: No evidence of pulmonary emboli. Increase in the degree of mucoid impaction in the lungs bilaterally worse on the right than the left. Reactive hilar lymph nodes stable from a prior exam of 20/12. No acute abnormality is noted. Aortic Atherosclerosis (ICD10-I70.0).  DG Chest Portable 1 View Result Date: 09/16/2022 IMPRESSION: Patchy interstitial and airspace opacities along the lung bases. Finding could represent infection/inflammation. Followup PA and lateral chest X-ray is recommended in 3-4 weeks following therapy to ensure resolution and exclude underlying malignancy.    ______________________  Procedures:  none  ______________________   Discharge Instructions:  Discharge Instructions     Diet - low sodium heart healthy   Complete by: As directed    Discharge instructions   Complete by: As directed    Mr Burek,  It was a pleasure taking care of you while you  were in the hospital. Your breath shortness was caused by an exacerbation of your chronic obstructive pulmonary disease. It is important to take your breathing medications to prevent this from happening again in the future.  We gave you several breathing medications and a steroid called prednisone, which improved your wheezing. You will continue to take these medications regularly at home and the prednisone for another six days.  Please continue to take:  - Umeclidinium 62.5ug (daily) - Fluticasone-vilanterol 100-25 (daily) - Albuterol 2.31m (as needed up to every four hours) - Prednisone 433m(daily for two days then 2072maily for two days then 46m67mily for two days then stop)   If your breath shortness or wheezing worsen, then please return to the emergency department.   Increase activity slowly   Complete by: As directed      Mr Steely,Cockert was a pleasure taking care of you while you were in the hospital. Your breath shortness was caused by an exacerbation of your chronic obstructive pulmonary disease. It is important to take your breathing medications to prevent this from happening again in the future.  We gave you several breathing medications and a steroid called prednisone, which improved your wheezing. You will continue to take these medications regularly at home and the prednisone for another six days.  Please continue to take:  - Umeclidinium 62.5ug (daily) - Fluticasone-vilanterol 100-25 (daily) - Albuterol 2.5mg 72m needed up to every four hours) - Prednisone 40mg 50mly for two days then 20mg d58m for two days then 46mg da53mfor two days then stop)   If your breath shortness or wheezing worsen, then please return to the emergency department.    Signed: Dan [facility] PGY-1 Pager [phone]

## 2022-09-21 NOTE — TOC Transition Note (Signed)
Transition of Care [facility]) - CM/SW Discharge Note   Patient Details  Name: [name] MRN: [id number] Date of Birth: 1961/02/15  Transition [facility]) CM/SW Contact:  Carles Collet, RN Phone Number: 09/21/2022, 11:34 AM   Clinical Narrative:      Thomas Hoff MD, no ambulatory saturations necessary, updated nurse. No other TOC needs identified for DC       Patient Goals and CMS Choice      Discharge Placement                         Discharge Plan and Services Additional resources added to the After Visit Summary for                                       Social Determinants of Health (SDOH) Interventions SDOH Screenings   Food Insecurity: No Food Insecurity (09/17/2022)  Housing: Low Risk  (09/17/2022)  Transportation Needs: No Transportation Needs (09/17/2022)  Utilities: Not At Risk (09/17/2022)  Depression (PHQ2-9): Low Risk  (03/18/2022)  Tobacco Use: Medium Risk (09/17/2022)     Readmission Risk Interventions     No data to display

## 2022-09-21 NOTE — Progress Notes (Signed)
RN gave patient DC instructions and the patient stated understanding. Patient asked for a work note for his sister who is leaving work to pick him up [employer] MD waiting for note. TOC is now filling his prescriptions.

## 2022-09-21 NOTE — Progress Notes (Signed)
   09/21/22 1100  Mobility  Activity Ambulated independently in hallway  Level of Assistance Independent  Assistive Device None  Distance Ambulated (ft) 550 ft  Activity Response Tolerated well  Mobility Referral Yes  $Mobility charge 1 Mobility   Mobility Specialist Progress Note  Received pt in bed having no complaints and agreeable to mobility. Pt was asymptomatic throughout ambulation and returned to room w/o fault. Left in EOB w/ call bell in reach and all needs met.  Lucious Groves Mobility Specialist  Please contact via SecureChat or Rehab office at [phone]

## 2022-09-21 NOTE — Progress Notes (Signed)
Nurse requested Mobility Specialist to perform oxygen saturation test with pt which includes removing pt from oxygen both at rest and while ambulating.  Below are the results from that testing.     Patient Saturations on Room Air at Rest = spO2 95%  Patient Saturations on Room Air while Ambulating = sp02 90% .  Rested and performed pursed lip breathing for 1 minute with sp02 at 92%.  Patient Saturations on 0 Liters of oxygen while Ambulating = sp02 92%  At end of testing pt left in room on 0  Liters of oxygen.  Reported results to nurse.

## 2022-09-27 ENCOUNTER — Ambulatory Visit (INDEPENDENT_AMBULATORY_CARE_PROVIDER_SITE_OTHER): Payer: Medicaid Other

## 2022-09-27 ENCOUNTER — Other Ambulatory Visit (HOSPITAL_COMMUNITY): Payer: Self-pay

## 2022-09-27 ENCOUNTER — Other Ambulatory Visit: Payer: Self-pay

## 2022-09-27 VITALS — BP 134/62 | HR 73 | Temp 98.1°F | Ht 71.0 in | Wt 243.6 lb

## 2022-09-27 DIAGNOSIS — N401 Enlarged prostate with lower urinary tract symptoms: Secondary | ICD-10-CM

## 2022-09-27 DIAGNOSIS — R339 Retention of urine, unspecified: Secondary | ICD-10-CM

## 2022-09-27 DIAGNOSIS — N400 Enlarged prostate without lower urinary tract symptoms: Secondary | ICD-10-CM | POA: Diagnosis not present

## 2022-09-27 DIAGNOSIS — J4489 Other specified chronic obstructive pulmonary disease: Secondary | ICD-10-CM

## 2022-09-27 DIAGNOSIS — I10 Essential (primary) hypertension: Secondary | ICD-10-CM

## 2022-09-27 DIAGNOSIS — Z23 Encounter for immunization: Secondary | ICD-10-CM | POA: Diagnosis not present

## 2022-09-27 DIAGNOSIS — E7800 Pure hypercholesterolemia, unspecified: Secondary | ICD-10-CM | POA: Diagnosis not present

## 2022-09-27 MED ORDER — BUDESONIDE-FORMOTEROL FUMARATE 160-4.5 MCG/ACT IN AERO: 2.0000 | INHALATION_SPRAY | Freq: Two times a day (BID) | RESPIRATORY_TRACT | 2 refills | Status: DC

## 2022-09-27 MED ORDER — SPIRIVA RESPIMAT 2.5 MCG/ACT IN AERS: 2.0000 | INHALATION_SPRAY | Freq: Every day | RESPIRATORY_TRACT | 2 refills | Status: DC

## 2022-09-27 MED ORDER — LOSARTAN POTASSIUM 100 MG PO TABS: 100.0000 mg | ORAL_TABLET | Freq: Every day | ORAL | 3 refills | Status: DC

## 2022-09-27 MED ORDER — ATORVASTATIN CALCIUM 40 MG PO TABS: 40.0000 mg | ORAL_TABLET | Freq: Every day | ORAL | 3 refills | Status: DC

## 2022-09-27 MED ORDER — ALBUTEROL SULFATE (2.5 MG/3ML) 0.083% IN NEBU: 2.5000 mg | INHALATION_SOLUTION | Freq: Four times a day (QID) | RESPIRATORY_TRACT | 12 refills | Status: DC | PRN

## 2022-09-27 MED ORDER — VENTOLIN HFA 108 (90 BASE) MCG/ACT IN AERS: 2.0000 | INHALATION_SPRAY | Freq: Four times a day (QID) | RESPIRATORY_TRACT | 1 refills | Status: DC | PRN

## 2022-09-27 MED ORDER — AMLODIPINE BESYLATE 10 MG PO TABS: 10.0000 mg | ORAL_TABLET | Freq: Every day | ORAL | 2 refills | Status: DC

## 2022-09-27 MED ORDER — TAMSULOSIN HCL 0.4 MG PO CAPS: 0.4000 mg | ORAL_CAPSULE | Freq: Every day | ORAL | 2 refills | Status: DC

## 2022-09-27 NOTE — Patient Instructions (Signed)
Thank you, [name] for allowing [place] to provide your care today. Today we discussed :  COPD: I have ordered repeat pulmonary function tests for you. I have also ordered pulmonary rehab to help improve your breathing and lung function. I have reordered your inhalers.  COVID and shingles shot: Ask your pharmacy  Flu shot: Done in clinic.  I have ordered the following labs for you:  Lab Orders  No laboratory test(s) ordered today       Referrals ordered today:    Referral Orders         AMB referral to pulmonary rehabilitation      I have ordered the following medication/changed the following medications:   Stop the following medications: Medications Discontinued During This Encounter  Medication Reason   atorvastatin (LIPITOR) 40 MG tablet Reorder   losartan (COZAAR) 100 MG tablet Reorder   amLODipine (NORVASC) 10 MG tablet Reorder   VENTOLIN HFA 108 (90 Base) MCG/ACT inhaler Reorder     Start the following medications: Meds ordered this encounter  Medications   budesonide-formoterol (SYMBICORT) 160-4.5 MCG/ACT inhaler    Sig: Inhale 2 puffs into the lungs 2 (two) times daily.    Dispense:  1 each    Refill:  2   Tiotropium Bromide Monohydrate (SPIRIVA RESPIMAT) 2.5 MCG/ACT AERS    Sig: Inhale 2 puffs into the lungs daily.    Dispense:  4 g    Refill:  2   VENTOLIN HFA 108 (90 Base) MCG/ACT inhaler    Sig: Inhale 2 puffs into the lungs every 6 (six) hours as needed for wheezing or shortness of breath.    Dispense:  18 g    Refill:  1   amLODipine (NORVASC) 10 MG tablet    Sig: Take 1 tablet (10 mg total) by mouth daily. TAKE 1 TABLET(10 MG) BY MOUTH DAILY Strength: 10 mg    Dispense:  30 tablet    Refill:  2   atorvastatin (LIPITOR) 40 MG tablet    Sig: Take 1 tablet (40 mg total) by mouth daily.    Dispense:  90 tablet    Refill:  3   losartan (COZAAR) 100 MG tablet    Sig: Take 1 tablet (100 mg total) by mouth daily.    Dispense:  90 tablet    Refill:   3   tamsulosin (FLOMAX) 0.4 MG CAPS capsule    Sig: Take 1 capsule (0.4 mg total) by mouth daily.    Dispense:  30 capsule    Refill:  2     Follow up: 3 months     We look forward to seeing you next time. Please call our clinic at [phone] if you have any questions or concerns. The best time to call is Monday-Friday from 9am-4pm, but there is someone available 24/7. If after hours or the weekend, call the main hospital number and ask for the Internal Medicine Resident On-Call. If you need medication refills, please notify your pharmacy one week in advance and they will send [place] a request.   Thank you for trusting me with your care. Wishing you the best!   Iona Coach, MD [place]

## 2022-09-27 NOTE — Progress Notes (Unsigned)
D/c 2/21 for COPD exacerbation  HTN BP uncontrolled today, 140/76 with recheck of 132/76. Regimen is amlodipine 10 mg daily and losartan 100 mg daily. He was not previously on Flomax due to financial constraints however has been able to restart this medication and is seeing improvement in BP. Plan:Continue amlodipine 10 mg daily, losartan 100 mg daily, Flomax 0.4 mg daily.  COPD We will restart him on his Spiriva, Symbicort and Atrovent.  Needs to follow-up with his pulmonologist.  Contact information for pulmonology provided. -d/c from hospital with prednisone taper and leftover incruse ellipta and breo ellipta(triple therapy)   L sciatica and chronic low back pain Pregabalin '100mg'$  TID not helpful Duloxetine '60mg'$  started  BPH Tamsulosin 0.'4mg'$  daily  HLD Repeat lipid panel performed, total cholesterol of 130 and LDL of 67. Continue atorvastatin 40 mg daily for primary prevention.  (01/2022)  HCM Flu, covid, shingles  BMP wnl 2/19 Cbc wnl 2/17

## 2022-09-28 NOTE — Assessment & Plan Note (Signed)
Patient recently discharged from the hospital 2/21 for COPD exacerbation. Patient is GOLD group E.Finished prednisone taper today. Was on spiriva and symbicort in the past. During hospitalization was sent home with remaining incruse ellipta and breo ellipta. Discharge summary recommended finding cheaper inhaler regimen as his admission was attributed to medication non-adherence secondary to affordability. Spiriva and symbicort are a 4 dollar copay each, this is going to be the cheapest option and we will pursue this. Patient currently HDS, saturating 96% on RA with normal wob and lctab.Denies cough, sob, CP. No smoking in 10 years. PFTs 2012 wnl, 2016 suggestive of COPD but cannot actually see these as they are only in a note. CT during hospital admission showed mucous plugging, otherwise no emphysema on chest imaging. May have a pure bronchitis picture but will check PFTs. Also pulmonary rehab referral given hospitalization. -symbicort 160-4.5 2 puffs BID -spiriva 2.5 2 puffs daily -albuterol nebs and inhaler prn -pulm rehab referral -PFTs -flu shot given

## 2022-09-28 NOTE — Assessment & Plan Note (Signed)
BP controlled at 134/62 in clinic. No lightheadedness/dizziness, CP,SOB, LE edema. Continue amlodipine 10 mg daily, losartan 100 mg daily, Flomax 0.4 mg daily.

## 2022-09-28 NOTE — Progress Notes (Signed)
Established Patient Office Visit  Subjective   Patient ID: OSHER THU, male    DOB: 1960/12/30  Age: 62 y.o. MRN: [id number]  Chief Complaint  Patient presents with   Transitions Of Care    Mr. Marone is a 62 y/o male with a pmh outlined below. Please see A&P for HPI information.      Review of Systems  All other systems reviewed and are negative.     Objective:     BP 134/62 (BP Location: Left Arm, Patient Position: Sitting, Cuff Size: Large)   Pulse 73   Temp 98.1 F (36.7 C) (Oral)   Ht '5\' 11"'$  (1.803 m)   Wt 243 lb 9.6 oz (110.5 kg)   SpO2 96%   BMI 33.98 kg/m    Physical Exam Constitutional:      General: He is not in acute distress.    Appearance: Normal appearance. He is obese. He is not ill-appearing.  Eyes:     General: No scleral icterus.    Extraocular Movements: Extraocular movements intact.     Conjunctiva/sclera: Conjunctivae normal.  Cardiovascular:     Rate and Rhythm: Normal rate and regular rhythm.     Pulses: Normal pulses.     Heart sounds: Normal heart sounds. No murmur heard.    No gallop.  Pulmonary:     Effort: Pulmonary effort is normal. No respiratory distress.     Breath sounds: Normal breath sounds. No wheezing or rales.  Musculoskeletal:     Right lower leg: No edema.     Left lower leg: No edema.  Skin:    General: Skin is warm and dry.     Capillary Refill: Capillary refill takes 2 to 3 seconds.  Neurological:     Mental Status: He is alert.      No results found for any visits on 09/27/22.    The 10-year ASCVD risk score (Arnett DK, et al., 2019) is: 14%    Assessment & Plan:   Problem List Items Addressed This Visit       Cardiovascular and Mediastinum   Essential hypertension, benign (Chronic)    BP controlled at 134/62 in clinic. No lightheadedness/dizziness, CP,SOB, LE edema. Continue amlodipine 10 mg daily, losartan 100 mg daily, Flomax 0.4 mg daily.      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 Patient is GOLD group E.Finished prednisone taper today. Was on spiriva and symbicort in the past. During hospitalization was sent home with remaining incruse ellipta and breo ellipta. Discharge summary recommended finding cheaper inhaler regimen as his admission was attributed to medication non-adherence secondary to affordability. Spiriva and symbicort are a 4 dollar copay each, this is going to be the cheapest option and we will pursue this. Patient currently HDS, saturating 96% on RA with normal wob and lctab.Denies cough, sob, CP. No smoking in 10 years. PFTs 2012 wnl, 2016 suggestive of COPD but cannot actually see these as they are only in a note. CT during hospital admission showed mucous plugging, otherwise no emphysema on chest imaging. May have a pure bronchitis picture but will check PFTs. Also pulmonary rehab referral given hospitalization. -symbicort 160-4.5 2 puffs BID -spiriva 2.5 2 puffs daily -albuterol nebs and inhaler prn -pulm  rehab referral -PFTs -flu shot given       Relevant Medications   budesonide-formoterol (SYMBICORT) 160-4.5 MCG/ACT inhaler   Tiotropium Bromide Monohydrate (SPIRIVA RESPIMAT) 2.5 MCG/ACT AERS   VENTOLIN HFA 108 (90 Base) MCG/ACT inhaler   albuterol (PROVENTIL) (2.5 MG/3ML) 0.083% nebulizer solution   Other Relevant Orders   AMB referral to pulmonary rehabilitation   Pulmonary function test     Genitourinary   Retention of urine   Relevant Medications   tamsulosin (FLOMAX) 0.4 MG CAPS capsule     Other   Hypercholesteremia   Relevant Medications   amLODipine (NORVASC) 10 MG tablet   atorvastatin (LIPITOR) 40 MG tablet   losartan (COZAAR) 100 MG tablet   Other Visit Diagnoses     Benign prostatic hyperplasia without lower urinary  tract symptoms       Relevant Medications   tamsulosin (FLOMAX) 0.4 MG CAPS capsule   Need for immunization against influenza       Relevant Orders   Flu Vaccine QUAD 62moIM (Fluarix, Fluzone & Alfiuria Quad PF) (Completed)       No follow-ups on file.    TIona Coach MD

## 2022-09-29 DIAGNOSIS — R32 Unspecified urinary incontinence: Secondary | ICD-10-CM | POA: Diagnosis not present

## 2022-09-29 DIAGNOSIS — I10 Essential (primary) hypertension: Secondary | ICD-10-CM | POA: Diagnosis not present

## 2022-09-29 DIAGNOSIS — J449 Chronic obstructive pulmonary disease, unspecified: Secondary | ICD-10-CM | POA: Diagnosis not present

## 2022-09-29 NOTE — Progress Notes (Signed)
Internal Medicine Clinic Attending  Case discussed with Dr. Stann Mainland  At the time of the visit.  We reviewed the resident's history and exam and pertinent patient test results.  I agree with the assessment, diagnosis, and plan of care documented in the resident's note.

## 2022-09-30 DIAGNOSIS — J189 Pneumonia, unspecified organism: Secondary | ICD-10-CM

## 2022-09-30 HISTORY — DX: Pneumonia, unspecified organism: J18.9

## 2022-10-04 ENCOUNTER — Other Ambulatory Visit: Payer: Self-pay

## 2022-10-04 DIAGNOSIS — J441 Chronic obstructive pulmonary disease with (acute) exacerbation: Secondary | ICD-10-CM

## 2022-10-05 NOTE — Addendum Note (Signed)
Addended by: Iona Coach on: 10/05/2022 08:12 AM   Modules accepted: Level of Service

## 2022-10-05 NOTE — Addendum Note (Signed)
Addended by: Iona Coach on: 10/05/2022 08:34 AM   Modules accepted: Level of Service

## 2022-10-06 ENCOUNTER — Other Ambulatory Visit: Payer: Self-pay

## 2022-10-06 DIAGNOSIS — G894 Chronic pain syndrome: Secondary | ICD-10-CM

## 2022-10-06 DIAGNOSIS — J310 Chronic rhinitis: Secondary | ICD-10-CM

## 2022-10-07 MED ORDER — PREGABALIN 100 MG PO CAPS: ORAL_CAPSULE | ORAL | 1 refills | Status: DC

## 2022-10-07 MED ORDER — FLUTICASONE PROPIONATE 50 MCG/ACT NA SUSP: 1.0000 | Freq: Every day | NASAL | 1 refills | Status: DC

## 2022-10-13 ENCOUNTER — Telehealth (HOSPITAL_COMMUNITY): Payer: Self-pay

## 2022-10-13 NOTE — Telephone Encounter (Signed)
Pt insurance does not cover the pulmonary rehab program. Closed referral.

## 2022-10-17 DIAGNOSIS — J324 Chronic pansinusitis: Secondary | ICD-10-CM | POA: Diagnosis not present

## 2022-10-17 DIAGNOSIS — J339 Nasal polyp, unspecified: Secondary | ICD-10-CM | POA: Diagnosis not present

## 2022-10-17 DIAGNOSIS — J343 Hypertrophy of nasal turbinates: Secondary | ICD-10-CM | POA: Diagnosis not present

## 2022-10-17 DIAGNOSIS — R430 Anosmia: Secondary | ICD-10-CM | POA: Diagnosis not present

## 2022-11-21 ENCOUNTER — Other Ambulatory Visit: Payer: Self-pay | Admitting: Otolaryngology

## 2022-11-29 ENCOUNTER — Other Ambulatory Visit: Payer: Self-pay

## 2022-11-29 DIAGNOSIS — J4489 Other specified chronic obstructive pulmonary disease: Secondary | ICD-10-CM

## 2022-12-05 NOTE — Progress Notes (Signed)
Surgical Instructions    Your procedure is scheduled on Wednesday Dec 14, 2022.  Report to [facility] Main Entrance "A" at 7:40 A.M., then check in with the Admitting office.  Call this number if you have problems the morning of surgery:  [phone]   If you have any questions prior to your surgery date call [phone]: Open Monday-Friday 8am-4pm If you experience any cold or flu symptoms such as cough, fever, chills, shortness of breath, etc. between now and your scheduled surgery, please notify us at the above number     Remember:  Do not eat after midnight the night before your surgery  You may drink clear liquids until 6:40 the morning of your surgery.   Clear liquids allowed are: Water, Non-Citrus Juices (without pulp), Carbonated Beverages, Clear Tea, Black Coffee ONLY (NO MILK, CREAM OR POWDERED CREAMER of any kind), and Gatorade    Take these medicines the morning of surgery with A SIP OF WATER:  amLODipine (NORVASC)  atorvastatin (LIPITOR)  budesonide-formoterol (SYMBICORT) Please bring with you the day of surgery.  fluticasone (FLONASE)  pregabalin (LYRICA)  Tiotropium Bromide Monohydrate (SPIRIVA RESPIMAT) Please bring with you the day of surgery. VENTOLIN HFA Inhaler: please bring with you the day surgery.  If needed:  albuterol (PROVENTIL)   As of today, STOP taking any Aspirin (unless otherwise instructed by your surgeon) Aleve, Naproxen, Ibuprofen, Motrin, Advil, Goody's, BC's, all herbal medications, fish oil, and all vitamins.  Special instructions:    Oral Hygiene is also important to reduce your risk of infection.  Remember - BRUSH YOUR TEETH THE MORNING OF SURGERY WITH YOUR REGULAR TOOTHPASTE   [place]- Preparing For Surgery  Before surgery, you can play an important role. Because skin is not sterile, your skin needs to be as free of germs as possible. You can reduce the number of germs on your skin by washing with CHG (chlorahexidine gluconate) Soap  before surgery.  CHG is an antiseptic cleaner which kills germs and bonds with the skin to continue killing germs even after washing.     Please do not use if you have an allergy to CHG or antibacterial soaps. If your skin becomes reddened/irritated stop using the CHG.  Do not shave (including legs and underarms) for at least 48 hours prior to first CHG shower. It is OK to shave your face.  Please follow these instructions carefully.     Shower the NIGHT BEFORE SURGERY and the MORNING OF SURGERY with CHG Soap.   If you chose to wash your hair, wash your hair first as usual with your normal shampoo. After you shampoo, rinse your hair and body thoroughly to remove the shampoo.  Then [employer] and genitals (private parts) with your normal soap and rinse thoroughly to remove soap.  After that Use CHG Soap as you would any other liquid soap. You can apply CHG directly to the skin and wash gently with a scrungie or a clean washcloth.   Apply the CHG Soap to your body ONLY FROM THE NECK DOWN.  Do not use on open wounds or open sores. Avoid contact with your eyes, ears, mouth and genitals (private parts). Wash Face and genitals (private parts)  with your normal soap.   Wash thoroughly, paying special attention to the area where your surgery will be performed.  Thoroughly rinse your body with warm water from the neck down.  DO NOT shower/wash with your normal soap after using and rinsing off the CHG Soap.  Pat yourself dry with a CLEAN TOWEL.  Wear CLEAN PAJAMAS to bed the night before surgery  Place CLEAN SHEETS on your bed the night before your surgery  DO NOT SLEEP WITH PETS.   Day of Surgery:  Take a shower with CHG soap. Wear Clean/Comfortable clothing the morning of surgery Do not apply any deodorants/lotions.   Remember to brush your teeth WITH YOUR REGULAR TOOTHPASTE.  Do not wear jewelry or makeup. Do not wear lotions, powders, perfumes/cologne or deodorant. Do not shave 48  hours prior to surgery.  Men may shave face and neck. Do not bring valuables to the hospital. Do not wear nail polish, gel polish, artificial nails, or any other type of covering on natural nails (fingers and toes) If you have artificial nails or gel coating that need to be removed by a nail salon, please have this removed prior to surgery. Artificial nails or gel coating may interfere with anesthesia's ability to adequately monitor your vital signs.  [facility] is not responsible for any belongings or valuables.    Do NOT Smoke (Tobacco/Vaping)  24 hours prior to your procedure  If you use a CPAP at night, you may bring your mask for your overnight stay.   Contacts, glasses, hearing aids, dentures or partials may not be worn into surgery, please bring cases for these belongings   For patients admitted to the hospital, discharge time will be determined by your treatment team.   Patients discharged the day of surgery will not be allowed to drive home, and someone needs to stay with them for 24 hours.   SURGICAL WAITING ROOM VISITATION Patients having surgery or a procedure may have no more than 2 support people in the waiting area - these visitors may rotate.   Children under the age of 77 must have an adult with them who is not the patient. If the patient needs to stay at the hospital during part of their recovery, the visitor guidelines for inpatient rooms apply. Pre-op nurse will coordinate an appropriate time for 1 support person to accompany patient in pre-op.  This support person may not rotate.   Please refer to [email or web address] for the visitor guidelines for Inpatients (after your surgery is over and you are in a regular room).   If you received a COVID test during your pre-op visit, it is requested that you wear a mask when out in public, stay away from anyone that may not be feeling well, and notify your surgeon if you develop  symptoms. If you have been in contact with anyone that has tested positive in the last 10 days, please notify your surgeon.    Please read over the following fact sheets that you were given.

## 2022-12-06 ENCOUNTER — Other Ambulatory Visit: Payer: Self-pay

## 2022-12-06 ENCOUNTER — Encounter (HOSPITAL_COMMUNITY): Payer: Self-pay

## 2022-12-06 ENCOUNTER — Encounter (HOSPITAL_COMMUNITY)
Admission: RE | Admit: 2022-12-06 | Discharge: 2022-12-06 | Disposition: A | Discharge status: Home / Self Care | Payer: Medicaid Other | Source: Ambulatory Visit | Attending: Otolaryngology | Admitting: Otolaryngology

## 2022-12-06 VITALS — BP 120/67 | HR 64 | Temp 98.0°F | Resp 18 | Ht 71.0 in | Wt 239.7 lb

## 2022-12-06 DIAGNOSIS — I10 Essential (primary) hypertension: Secondary | ICD-10-CM | POA: Diagnosis not present

## 2022-12-06 DIAGNOSIS — Z01818 Encounter for other preprocedural examination: Secondary | ICD-10-CM | POA: Diagnosis not present

## 2022-12-06 HISTORY — DX: Dyspnea, unspecified: R06.00

## 2022-12-06 HISTORY — DX: Unspecified osteoarthritis, unspecified site: M19.90

## 2022-12-06 HISTORY — DX: Other complications of anesthesia, initial encounter: T88.59XA

## 2022-12-06 LAB — CBC
HCT: 46.7 % (ref 39.0–52.0)
Hemoglobin: 15 g/dL (ref 13.0–17.0)
MCH: 28.5 pg (ref 26.0–34.0)
MCHC: 32.1 g/dL (ref 30.0–36.0)
MCV: 88.8 fL (ref 80.0–100.0)
Platelets: 243 10*3/uL (ref 150–400)
RBC: 5.26 MIL/uL (ref 4.22–5.81)
RDW: 15.9 % — ABNORMAL HIGH (ref 11.5–15.5)
WBC: 4.9 10*3/uL (ref 4.0–10.5)
nRBC: 0 % (ref 0.0–0.2)

## 2022-12-06 LAB — BASIC METABOLIC PANEL
Anion gap: 7 (ref 5–15)
BUN: 16 mg/dL (ref 8–23)
CO2: 30 mmol/L (ref 22–32)
Calcium: 9 mg/dL (ref 8.9–10.3)
Chloride: 102 mmol/L (ref 98–111)
Creatinine, Ser: 1.2 mg/dL (ref 0.61–1.24)
GFR, Estimated: >60 mL/min (ref >60)
Glucose, Bld: 215 mg/dL — ABNORMAL HIGH (ref 70–99)
Potassium: 3.6 mmol/L (ref 3.5–5.1)
Sodium: 139 mmol/L (ref 135–145)

## 2022-12-06 NOTE — Progress Notes (Signed)
PCP - Research scientist (life sciences) - Denies  PPM/ICD - Denies  Chest x-ray - Denies EKG - 12/06/2022 Stress Test - Denies ECHO - Denies Cardiac Cath - Denies  Sleep Study - Denies  DM: Denies  Blood Thinner Instructions: N/A Aspirin Instructions: N/A  ERAS Protcol - Yes PRE-SURGERY Ensure or G2- No drink  COVID TEST- N/A   Anesthesia review: No  Patient denies shortness of breath, fever, cough and chest pain at PAT appointment   All instructions explained to the patient, with a verbal understanding of the material. Patient agrees to go over the instructions while at home for a better understanding. The opportunity to ask questions was provided.

## 2022-12-14 ENCOUNTER — Ambulatory Visit (HOSPITAL_COMMUNITY): Payer: Medicaid Other | Admitting: Registered Nurse

## 2022-12-14 ENCOUNTER — Other Ambulatory Visit: Payer: Self-pay

## 2022-12-14 ENCOUNTER — Encounter (HOSPITAL_COMMUNITY)
Admission: RE | Disposition: A | Discharge status: Home / Self Care | Payer: Self-pay | Source: Home / Self Care | Attending: Otolaryngology

## 2022-12-14 ENCOUNTER — Ambulatory Visit (HOSPITAL_BASED_OUTPATIENT_CLINIC_OR_DEPARTMENT_OTHER): Payer: Medicaid Other | Admitting: Registered Nurse

## 2022-12-14 ENCOUNTER — Other Ambulatory Visit (HOSPITAL_COMMUNITY): Payer: Self-pay

## 2022-12-14 ENCOUNTER — Encounter (HOSPITAL_COMMUNITY): Payer: Self-pay | Admitting: Otolaryngology

## 2022-12-14 ENCOUNTER — Ambulatory Visit (HOSPITAL_COMMUNITY)
Admission: RE | Admit: 2022-12-14 | Discharge: 2022-12-14 | Disposition: A | Discharge status: Home / Self Care | Payer: Medicaid Other | Attending: Otolaryngology | Admitting: Otolaryngology

## 2022-12-14 DIAGNOSIS — G8929 Other chronic pain: Secondary | ICD-10-CM | POA: Diagnosis not present

## 2022-12-14 DIAGNOSIS — I10 Essential (primary) hypertension: Secondary | ICD-10-CM | POA: Diagnosis not present

## 2022-12-14 DIAGNOSIS — J343 Hypertrophy of nasal turbinates: Secondary | ICD-10-CM

## 2022-12-14 DIAGNOSIS — J339 Nasal polyp, unspecified: Secondary | ICD-10-CM | POA: Insufficient documentation

## 2022-12-14 DIAGNOSIS — Z87891 Personal history of nicotine dependence: Secondary | ICD-10-CM | POA: Diagnosis not present

## 2022-12-14 DIAGNOSIS — F32A Depression, unspecified: Secondary | ICD-10-CM | POA: Insufficient documentation

## 2022-12-14 DIAGNOSIS — J329 Chronic sinusitis, unspecified: Secondary | ICD-10-CM

## 2022-12-14 DIAGNOSIS — K219 Gastro-esophageal reflux disease without esophagitis: Secondary | ICD-10-CM | POA: Insufficient documentation

## 2022-12-14 DIAGNOSIS — J449 Chronic obstructive pulmonary disease, unspecified: Secondary | ICD-10-CM

## 2022-12-14 DIAGNOSIS — M1990 Unspecified osteoarthritis, unspecified site: Secondary | ICD-10-CM | POA: Diagnosis not present

## 2022-12-14 DIAGNOSIS — Z539 Procedure and treatment not carried out, unspecified reason: Secondary | ICD-10-CM | POA: Diagnosis not present

## 2022-12-14 DIAGNOSIS — J324 Chronic pansinusitis: Secondary | ICD-10-CM

## 2022-12-14 DIAGNOSIS — J4489 Other specified chronic obstructive pulmonary disease: Secondary | ICD-10-CM

## 2022-12-14 SURGERY — CANCELLED PROCEDURE
Anesthesia: General

## 2022-12-14 MED ORDER — EPINEPHRINE 1 MG/10ML IJ SOSY
PREFILLED_SYRINGE | INTRAMUSCULAR | Status: AC
  Filled 2022-12-14: qty 10

## 2022-12-14 MED ORDER — LIDOCAINE 2% (20 MG/ML) 5 ML SYRINGE
INTRAMUSCULAR | Status: DC | PRN
  Administered 2022-12-14: 60 mg via INTRAVENOUS

## 2022-12-14 MED ORDER — CHLORHEXIDINE GLUCONATE 0.12 % MT SOLN
15.0000 mL | Freq: Once | OROMUCOSAL | Status: AC
  Administered 2022-12-14: 15 mL via OROMUCOSAL
  Filled 2022-12-14: qty 15

## 2022-12-14 MED ORDER — LIDOCAINE 2% (20 MG/ML) 5 ML SYRINGE
INTRAMUSCULAR | Status: AC
  Filled 2022-12-14: qty 5

## 2022-12-14 MED ORDER — EPINEPHRINE 1 MG/10ML IJ SOSY
PREFILLED_SYRINGE | INTRAMUSCULAR | Status: DC | PRN
  Administered 2022-12-14: 30 ug via INTRAVENOUS

## 2022-12-14 MED ORDER — PROPOFOL 10 MG/ML IV BOLUS
INTRAVENOUS | Status: DC | PRN
  Administered 2022-12-14: 25 ug/kg/min via INTRAVENOUS
  Administered 2022-12-14: 200 mg via INTRAVENOUS
  Administered 2022-12-14: 25 mg via INTRAVENOUS

## 2022-12-14 MED ORDER — ROCURONIUM BROMIDE 10 MG/ML (PF) SYRINGE
PREFILLED_SYRINGE | INTRAVENOUS | Status: DC | PRN
  Administered 2022-12-14: 50 mg via INTRAVENOUS

## 2022-12-14 MED ORDER — IPRATROPIUM-ALBUTEROL 0.5-2.5 (3) MG/3ML IN SOLN
RESPIRATORY_TRACT | Status: AC
  Filled 2022-12-14: qty 3

## 2022-12-14 MED ORDER — HYDROCODONE-ACETAMINOPHEN 5-325 MG PO TABS
1.0000 | ORAL_TABLET | Freq: Four times a day (QID) | ORAL | 0 refills | Status: AC | PRN
  Filled 2022-12-14: qty 10, 3d supply, fill #0

## 2022-12-14 MED ORDER — ORAL CARE MOUTH RINSE: 15.0000 mL | Freq: Once | OROMUCOSAL | Status: AC

## 2022-12-14 MED ORDER — ONDANSETRON HCL 4 MG/2ML IJ SOLN
INTRAMUSCULAR | Status: DC | PRN
  Administered 2022-12-14: 4 mg via INTRAVENOUS

## 2022-12-14 MED ORDER — VASOPRESSIN 20 UNIT/ML IV SOLN
INTRAVENOUS | Status: AC
  Filled 2022-12-14: qty 1

## 2022-12-14 MED ORDER — SUCCINYLCHOLINE CHLORIDE 200 MG/10ML IV SOSY
PREFILLED_SYRINGE | INTRAVENOUS | Status: AC
  Filled 2022-12-14: qty 10

## 2022-12-14 MED ORDER — PROPOFOL 10 MG/ML IV BOLUS
INTRAVENOUS | Status: AC
  Filled 2022-12-14: qty 20

## 2022-12-14 MED ORDER — PHENYLEPHRINE 80 MCG/ML (10ML) SYRINGE FOR IV PUSH (FOR BLOOD PRESSURE SUPPORT)
PREFILLED_SYRINGE | INTRAVENOUS | Status: DC | PRN
  Administered 2022-12-14 (×2): 160 ug via INTRAVENOUS

## 2022-12-14 MED ORDER — MIDAZOLAM HCL 2 MG/2ML IJ SOLN
INTRAMUSCULAR | Status: AC
  Filled 2022-12-14: qty 2

## 2022-12-14 MED ORDER — FENTANYL CITRATE (PF) 100 MCG/2ML IJ SOLN: 25.0000 ug | INTRAMUSCULAR | Status: DC | PRN

## 2022-12-14 MED ORDER — OXYMETAZOLINE HCL 0.05 % NA SOLN
NASAL | Status: AC
  Filled 2022-12-14: qty 30

## 2022-12-14 MED ORDER — FENTANYL CITRATE (PF) 250 MCG/5ML IJ SOLN
INTRAMUSCULAR | Status: DC | PRN
  Administered 2022-12-14: 100 ug via INTRAVENOUS

## 2022-12-14 MED ORDER — EPHEDRINE SULFATE-NACL 50-0.9 MG/10ML-% IV SOSY
PREFILLED_SYRINGE | INTRAVENOUS | Status: DC | PRN
  Administered 2022-12-14: 10 mg via INTRAVENOUS
  Administered 2022-12-14: 5 mg via INTRAVENOUS

## 2022-12-14 MED ORDER — CEFAZOLIN SODIUM-DEXTROSE 2-4 GM/100ML-% IV SOLN
2.0000 g | INTRAVENOUS | Status: AC
  Administered 2022-12-14: 2 g via INTRAVENOUS
  Filled 2022-12-14: qty 100

## 2022-12-14 MED ORDER — LACTATED RINGERS IV SOLN: INTRAVENOUS | Status: DC

## 2022-12-14 MED ORDER — PREDNISONE 10 MG PO TABS
ORAL_TABLET | ORAL | 0 refills | Status: AC
  Filled 2022-12-14: qty 30, 12d supply, fill #0

## 2022-12-14 MED ORDER — FENTANYL CITRATE (PF) 250 MCG/5ML IJ SOLN
INTRAMUSCULAR | Status: AC
  Filled 2022-12-14: qty 5

## 2022-12-14 MED ORDER — LIDOCAINE-EPINEPHRINE 1 %-1:100000 IJ SOLN
INTRAMUSCULAR | Status: AC
  Filled 2022-12-14: qty 1

## 2022-12-14 MED ORDER — ACETAMINOPHEN 500 MG PO TABS
1000.0000 mg | ORAL_TABLET | Freq: Once | ORAL | Status: AC
  Administered 2022-12-14: 1000 mg via ORAL
  Filled 2022-12-14: qty 2

## 2022-12-14 MED ORDER — VASOPRESSIN 20 UNIT/ML IV SOLN
INTRAVENOUS | Status: DC | PRN
  Administered 2022-12-14: 2 [IU] via INTRAVENOUS

## 2022-12-14 MED ORDER — PHENYLEPHRINE 80 MCG/ML (10ML) SYRINGE FOR IV PUSH (FOR BLOOD PRESSURE SUPPORT)
PREFILLED_SYRINGE | INTRAVENOUS | Status: AC
  Filled 2022-12-14: qty 10

## 2022-12-14 MED ORDER — MIDAZOLAM HCL 2 MG/2ML IJ SOLN
INTRAMUSCULAR | Status: DC | PRN
  Administered 2022-12-14: 2 mg via INTRAVENOUS

## 2022-12-14 MED ORDER — EPHEDRINE 5 MG/ML INJ
INTRAVENOUS | Status: AC
  Filled 2022-12-14: qty 5

## 2022-12-14 MED ORDER — ONDANSETRON HCL 4 MG/2ML IJ SOLN
INTRAMUSCULAR | Status: AC
  Filled 2022-12-14: qty 2

## 2022-12-14 MED ORDER — EPINEPHRINE HCL (NASAL) 0.1 % NA SOLN
NASAL | Status: AC
  Filled 2022-12-14: qty 30

## 2022-12-14 MED ORDER — DEXAMETHASONE SODIUM PHOSPHATE 10 MG/ML IJ SOLN
INTRAMUSCULAR | Status: AC
  Filled 2022-12-14: qty 1

## 2022-12-14 MED ORDER — DEXAMETHASONE SODIUM PHOSPHATE 10 MG/ML IJ SOLN
INTRAMUSCULAR | Status: DC | PRN
  Administered 2022-12-14: 10 mg via INTRAVENOUS

## 2022-12-14 MED ORDER — IPRATROPIUM-ALBUTEROL 0.5-2.5 (3) MG/3ML IN SOLN
3.0000 mL | Freq: Once | RESPIRATORY_TRACT | Status: AC
  Administered 2022-12-14: 3 mL via RESPIRATORY_TRACT

## 2022-12-14 MED ORDER — CEFADROXIL 500 MG PO CAPS
500.0000 mg | ORAL_CAPSULE | Freq: Two times a day (BID) | ORAL | 0 refills | Status: AC
  Filled 2022-12-14: qty 20, 10d supply, fill #0

## 2022-12-14 MED ORDER — ALBUTEROL SULFATE HFA 108 (90 BASE) MCG/ACT IN AERS
INHALATION_SPRAY | RESPIRATORY_TRACT | Status: DC | PRN
  Administered 2022-12-14 (×3): 6 via RESPIRATORY_TRACT

## 2022-12-14 MED ORDER — SUGAMMADEX SODIUM 200 MG/2ML IV SOLN
INTRAVENOUS | Status: DC | PRN
  Administered 2022-12-14: 200 mg via INTRAVENOUS

## 2022-12-14 MED ORDER — ALBUTEROL SULFATE HFA 108 (90 BASE) MCG/ACT IN AERS
INHALATION_SPRAY | RESPIRATORY_TRACT | Status: AC
  Filled 2022-12-14: qty 6.7

## 2022-12-14 SURGICAL SUPPLY — 48 items
ATTRACTOMAT 16X20 MAGNETIC DRP (DRAPES) IMPLANT
BAG COUNTER SPONGE SURGICOUNT (BAG) ×4 IMPLANT
BALLN FRONTAL NUVENT 6X17 (BALLOONS)
BALLOON FRONTAL NUVENT 6X17 (BALLOONS) IMPLANT
BLADE INF TURB ROT M4 2 5PK (BLADE) IMPLANT
BLADE ROTATE RAD 40 4 M4 (BLADE) IMPLANT
BLADE ROTATE TRICUT 4X13 M4 (BLADE) ×4 IMPLANT
BLADE SURG 15 STRL LF DISP TIS (BLADE) IMPLANT
BLADE SURG 15 STRL SS (BLADE)
CANISTER SUCT 3000ML PPV (MISCELLANEOUS) ×8 IMPLANT
COAGULATOR SUCT 8FR VV (MISCELLANEOUS) IMPLANT
COAGULATOR SUCT SWTCH 10FR 6 (ELECTROSURGICAL) IMPLANT
DRAPE HALF SHEET 40X57 (DRAPES) IMPLANT
DRSG NASOPORE 8CM (GAUZE/BANDAGES/DRESSINGS) IMPLANT
ELECT COATED BLADE 2.86 ST (ELECTRODE) IMPLANT
ELECT REM PT RETURN 9FT ADLT (ELECTROSURGICAL) ×2
ELECTRODE REM PT RTRN 9FT ADLT (ELECTROSURGICAL) ×4 IMPLANT
FILTER ARTHROSCOPY CONVERTOR (FILTER) ×4 IMPLANT
GLOVE BIO SURGEON STRL SZ 6.5 (GLOVE) ×4 IMPLANT
HEMOSTAT ARISTA ABSORB 3G PWDR (HEMOSTASIS) IMPLANT
INFLATOR BALLOON W/TUBE (BALLOONS) IMPLANT
KIT BASIN OR (CUSTOM PROCEDURE TRAY) ×4 IMPLANT
KIT TURNOVER KIT B (KITS) ×4 IMPLANT
NDL HYPO 25GX1X1/2 BEV (NEEDLE) ×4 IMPLANT
NDL SPNL 25GX3.5 QUINCKE BL (NEEDLE) ×2 IMPLANT
NEEDLE HYPO 25GX1X1/2 BEV (NEEDLE) ×4 IMPLANT
NEEDLE SPNL 25GX3.5 QUINCKE BL (NEEDLE) ×2 IMPLANT
NS IRRIG 1000ML POUR BTL (IV SOLUTION) ×4 IMPLANT
PAD ARMBOARD 7.5X6 YLW CONV (MISCELLANEOUS) ×8 IMPLANT
PATTIES SURGICAL .5 X3 (DISPOSABLE) ×4 IMPLANT
PENCIL SMOKE EVACUATOR (MISCELLANEOUS) IMPLANT
SOL ANTI FOG 6CC (MISCELLANEOUS) ×4 IMPLANT
SPLINT NASAL DOYLE BI-VL (GAUZE/BANDAGES/DRESSINGS) IMPLANT
SPLINT NASAL POSISEP X .6X2 (GAUZE/BANDAGES/DRESSINGS) ×4 IMPLANT
SUT CHROMIC 4 0 P 3 18 (SUTURE) IMPLANT
SUT PLAIN 4 0 ~~LOC~~ 1 (SUTURE) IMPLANT
SUT SILK 2 0 SH (SUTURE) ×4 IMPLANT
SWAB COLLECTION DEVICE MRSA (MISCELLANEOUS) IMPLANT
SWAB CULTURE ESWAB REG 1ML (MISCELLANEOUS) IMPLANT
SYR CONTROL 10ML LL (SYRINGE) IMPLANT
SYR TB 1ML LUER SLIP (SYRINGE) ×8 IMPLANT
TOWEL GREEN STERILE FF (TOWEL DISPOSABLE) ×4 IMPLANT
TRACKER ENT INSTRUMENT (MISCELLANEOUS) ×8 IMPLANT
TRACKER ENT PATIENT (MISCELLANEOUS) ×4 IMPLANT
TRAY ENT MC OR (CUSTOM PROCEDURE TRAY) ×4 IMPLANT
TUBE CONNECTING 12X1/4 (SUCTIONS) ×4 IMPLANT
TUBING EXTENTION W/L.L. (IV SETS) IMPLANT
TUBING STRAIGHTSHOT EPS 5PK (TUBING) ×4 IMPLANT

## 2022-12-14 NOTE — H&P (Signed)
[name] is an 62 y.o. male.    Chief Complaint:  Chronic pansinusitis with nasal polyps  HPI: Patient presents today for planned elective procedure.  [name] any interval change in history since office visit on 10/17/2022:  [name] is a 62 y.o. male who presents as a return consult patient, referred by [name], MD, for evaluation and treatment of chronic sinusitis with nasal polyps. Patient was initially seen in our office by [name], PA on 09/01/2022. At that time, he was treated with a 2-week course of clindamycin in conjunction with oral prednisone taper, and had a posttreatment CT scan demonstrating persistent opacification of all paranasal sinuses with persistent polyps. Patient presents today to discuss options for surgical treatment. He endorses longstanding history of nasal congestion, anosmia. He states that following completion of treatment, he did not note any significant improvement. He is using saline and Flonase regularly without any significant change in his symptoms. No history of sinonasal surgery, no recent history of nasal trauma. Previous CT scan performed in 2021 demonstrated widespread chronic inflammatory changes throughout the paranasal sinuses with obstruction of bilateral ostiomeatal complexes.   Past Medical History:  Diagnosis Date   Allergic rhinitis 04/25/2012   Arthritis    Asthma, chronic 10/21/2010   Followed in Pulmonary clinic/ [facility]/ [place]   - HFA 50% 01/04/2011  > 90% 02/04/2011   - PFT's wnl 02/04/2011  - no intubation before  - has been hospitalized once for asthma in 2012 - quit smoking in 2012 - allergic to aspirin and pollen   10/11/2014>>Repeat pulmonary function tests. Performed for disability application FEV1/FVC=68.5%, FEV1 48%. Consistent with COPD, severe Please see results under media    Chronic pain syndrome 02/03/2011   Involving the shoulder joint  05/20/2013: right shoulder x ray >>Degenerative changes AC joint Seen by  PT by 11/2013 >> improvement 03/13/2014 D/c from PT due to orange card being expired and pt cancelling appt    Complication of anesthesia    slow to wake up   COPD (chronic obstructive pulmonary disease) (HCC)    ex[place] 6/13, potentially due to ASA use.    Dyspnea    GERD (gastroesophageal reflux disease)    Hyperlipidemia    Hypertension    Obesity (BMI 30-39.9)    Pneumonia 09/2022   Substance abuse (HCC)    cocaine 35 years ago    Past Surgical History:  Procedure Laterality Date   ALVEOLOPLASTY N/A 05/12/2020   Procedure: ALVEOLOPLASTY;  Surgeon: Ocie Doyne, DDS;  Location: [facility];  Service: Oral Surgery;  Laterality: N/A;   CHOLECYSTECTOMY  07/30/89   PELVIC FRACTURE SURGERY  unknown   required surgery on his bladder   TOOTH EXTRACTION N/A 05/12/2020   Procedure: DENTAL RESTORATION/EXTRACTIONS;  Surgeon: Ocie Doyne, DDS;  Location: [facility];  Service: Oral Surgery;  Laterality: N/A;    Family History  Problem Relation Age of Onset   Pancreatic cancer Mother    Alcohol abuse Father    Sarcoidosis Brother     Social History:  reports that he quit smoking about 13 years ago. His smoking use included cigarettes. He has a 40.00 pack-year smoking history. He has never used smokeless tobacco. He reports that he does not use drugs. No history on file for alcohol use.  Allergies:  Allergies  Allergen Reactions   Aspirin Shortness Of Breath   Nsaids Shortness Of Breath    Medications Prior to Admission  Medication Sig Dispense Refill  amLODipine (NORVASC) 10 MG tablet Take 1 tablet (10 mg total) by mouth daily. TAKE 1 TABLET(10 MG) BY MOUTH DAILY Strength: 10 mg 30 tablet 2   atorvastatin (LIPITOR) 40 MG tablet Take 1 tablet (40 mg total) by mouth daily. 90 tablet 3   budesonide-formoterol (SYMBICORT) 160-4.5 MCG/ACT inhaler Inhale 2 puffs into the lungs 2 (two) times daily. 1 each 2   losartan (COZAAR) 100 MG tablet Take 1 tablet (100  mg total) by mouth daily. 90 tablet 3   pregabalin (LYRICA) 100 MG capsule TAKE 1 CAPSULE(100 MG) BY MOUTH THREE TIMES DAILY (Patient taking differently: Take 100 mg by mouth daily as needed (pain). TAKE 1 CAPSULE(100 MG) BY MOUTH THREE TIMES DAILY) 90 capsule 1   tamsulosin (FLOMAX) 0.4 MG CAPS capsule Take 1 capsule (0.4 mg total) by mouth daily. 30 capsule 2   Tiotropium Bromide Monohydrate (SPIRIVA RESPIMAT) 2.5 MCG/ACT AERS Inhale 2 puffs into the lungs daily. 4 g 2   VENTOLIN HFA 108 (90 Base) MCG/ACT inhaler INHALE 2 PUFFS INTO THE LUNGS EVERY 6 HOURS AS NEEDED FOR WHEEZING OR SHORTNESS OF BREATH 54 g 2   albuterol (PROVENTIL) (2.5 MG/3ML) 0.083% nebulizer solution Take 3 mLs (2.5 mg total) by nebulization every 6 (six) hours as needed for wheezing or shortness of breath. (Patient not taking: Reported on 12/06/2022) 75 mL 12   fluticasone (FLONASE) 50 MCG/ACT nasal spray Place 1 spray into both nostrils daily. (Patient not taking: Reported on 12/06/2022) 9.9 mL 1    No results found for this or any previous visit (from the past 48 hour(s)). No results found.  ROS: ROS  Blood pressure 134/80, pulse 68, temperature 98.1 F (36.7 C), temperature source Oral, resp. rate 18, height 5\' 11"  (1.803 m), weight 111.1 kg, SpO2 91 %.  PHYSICAL EXAM: Physical Exam Constitutional:      Appearance: Normal appearance.  HENT:     Right Ear: External ear normal.     Left Ear: External ear normal.  Pulmonary:     Effort: Pulmonary effort is normal.  Neurological:     General: No focal deficit present.     Mental Status: He is alert. Mental status is at baseline.     Studies Reviewed: Ct sinus reviewed,demonstrates near-total total opacification of all. Nasal sinuses, with significant nasal passage obstruction secondary to polypoid tissue. Bilateral inferior turbinate hypertrophy also noted    Assessment/Plan [name] is a 62 y.o. male with longstanding history of nasal obstruction, anosmia,  chronic pansinusitis with nasal polyps, unresponsive to maximal medical therapy.  -To OR today for functional endoscopic sinus surgery with bilateral maxillary antrostomy, total ethmoidectomy, nasofrontal recess exploration, sphenoidotomy and bilateral inferior turbinate reduction with image guidance and placement of propel stents. Risks of surgery, including bleeding, meningitis, leakage of cerebral spinal fluid, eye injury, injury to the carotid artery, injury to the tear duct system causing excessive tearing, numbness of the upper teeth and gums, damage to the olfactory nerves causing loss of smell, excessive crust formation after the operation, and recurrent polyposis were reviewed with patient. Patient was counseled that due to longstanding history of anosmia, it is possible that this will persist even after surgery. All questions answered.    Kadden Osterhout A Deveney Bayon 12/14/2022, 8:36 AM

## 2022-12-14 NOTE — Transfer of Care (Signed)
Immediate Anesthesia Transfer of Care Note  Patient: [name]  Procedure(s) Performed: SINUS ENDO WITH FUSION (Bilateral) TURBINATE REDUCTION/SUBMUCOSAL RESECTION (Bilateral)  Patient Location: PACU  Anesthesia Type:General  Level of Consciousness: drowsy and patient cooperative  Airway & Oxygen Therapy: Patient connected to face mask oxygen  Post-op Assessment: Report given to RN and Post -op Vital signs reviewed and stable  Post vital signs: Reviewed and stable  Last Vitals:  Vitals Value Taken Time  BP 149/123 12/14/22 1145  Temp    Pulse 105 12/14/22 1147  Resp 15 12/14/22 1147  SpO2 95 % 12/14/22 1147  Vitals shown include unvalidated device data.  Last Pain:  Vitals:   12/14/22 0840  TempSrc:   PainSc: 0-No pain         Complications: No notable events documented.

## 2022-12-14 NOTE — Anesthesia Procedure Notes (Addendum)
Procedure Name: Intubation Date/Time: 12/14/2022 10:08 AM  Performed by: Sharyn Dross, CRNAPre-anesthesia Checklist: Patient identified, Emergency Drugs available, Suction available and Patient being monitored Patient Re-evaluated:Patient Re-evaluated prior to induction Oxygen Delivery Method: Circle system utilized Preoxygenation: Pre-oxygenation with 100% oxygen Induction Type: IV induction Ventilation: Two handed mask ventilation required Laryngoscope Size: Mac and 4 Grade View: Grade I Tube type: Oral Tube size: 7.5 mm Number of attempts: 3 (see note) Airway Equipment and Method: Stylet and Oral airway Placement Confirmation: ETT inserted through vocal cords under direct vision, positive ETCO2 and breath sounds checked- equal and bilateral Secured at: 23 cm Tube secured with: Tape Dental Injury: Teeth and Oropharynx as per pre-operative assessment

## 2022-12-14 NOTE — Discharge Instructions (Addendum)
[facility] S[facility]US [facility]GERY (FESS) Post Operative Instructions  Office: [phone]  The Surgery Itself Endoscopic sinus surgery (with or without septoplasty and turbinate reduction) involves general anesthesia, typically for one to two hours. Patients may be sedated for several hours after surgery and may remain sleepy for the better part of the day. Nausea and vomiting are occasionally seen, and usually resolve by the evening of surgery - even without additional medications. Almost all patients can go home the day of surgery.  After Surgery  Facial pressure and fullness similar to a sinus infection/headache is normal after surgery. Breathing through your nose is also difficult due to swelling. A humidifier or vaporizer can be used in the bedroom to prevent throat pain with mouth breathing.   Bloody nasal drainage is normal after this surgery for 5-7 days, usually decreasing in volume with each day that passes. Drainage will flow from the front of the nose and down the back of the throat. Make sure you spit out blood drainage that drips down the back of your throat to prevent nausea/vomiting. You will have a nasal drip pad/sling with gauze to catch drainage from the front of your nose. The dressing may need to be changed frequently during the first 24 hours following surgery. In case of profuse nasal bleeding, you may apply ice to the bridge of the nose and pinch the nose just above the tip and hold for 10 minutes; if bleeding continues, contact the doctors office.   Frequent hot showers or saline nasal rinses (NeilMed) will help break up congestion and clear any clot or mucus that builds up within the nose after surgery. This can be started the day after surgery.   It is more comfortable to sleep with extra pillows or in a recliner for the first few days after surgery until the drainage begins to resolve.    Do not blow your nose for 2 weeks after surgery.   Avoid lifting > 10 lbs. and no  vigorous exercise for 2 weeks after surgery.   Avoid airplane travel for 2 weeks following sinus surgery; the cabin pressure changes can cause pain and swelling within the nose/sinuses.   Sense of smell and taste are often diminished for several weeks after surgery. There may be some tenderness or numbness in your upper front teeth, which is normal after surgery. You may express old clot, discolored mucus or very large nasal crusts from your nose for up to 3-4 weeks after surgery; depending on how frequently and how effectively you irrigate your nose with the saltwater spray.   You may have absorbable sutures inside of your nose after surgery that will slowly dissolve in 2-3 weeks. Be careful when clearing crusts from the nose since they may be attached to these sutures.  Medications  Pain medication can be used for pain as prescribed. Pain and pressure in the nose is expected after surgery. As the surgical site heals, pain will resolve over the course of a week. Pain medications can cause nausea, which can be prevented if you take them with food or milk. You may take Tylenol and Motrin for pain.   Limit Acetaminophen/Tylenol to less than 4,000mg /day   Limit Ibuprofen/Motrin to less than 3,600mg /day   You may be given an antibiotic for one week after surgery to prevent infection. Take this medication with food to prevent nausea or vomiting.   You can use 2 nasal sprays after surgery: Afrin can be used up to 2 times a day for  up to 5 days after surgery (best before bed) to reduce bloody drainage from the nose for the first few days after surgery. Saline/salt water spray should be used at least 4-6 times per day, starting the day after surgery to prevent crusting inside of the nose.   Take all of your routine medications as prescribed, unless told otherwise by your surgeon. Any medications that thin the blood should be avoided. This includes aspirin. Avoid aspirin-like products for the first 72 hours  after surgery (Advil, Motrin, Excedrin, Alieve, Celebrex, Naprosyn), but you may use them as needed for pain after 72 hours.

## 2022-12-14 NOTE — Anesthesia Preprocedure Evaluation (Addendum)
Anesthesia Evaluation  Patient identified by MRN, date of birth, ID band Patient awake    Reviewed: Allergy & Precautions, NPO status , Patient's Chart, lab work & pertinent test results  Airway Mallampati: II  TM Distance: >3 FB Neck ROM: Full    Dental  (+) Edentulous Upper, Edentulous Lower, Dental Advisory Given   Pulmonary asthma , COPD,  COPD inhaler, former smoker   Pulmonary exam normal breath sounds clear to auscultation       Cardiovascular hypertension, Normal cardiovascular exam Rhythm:Regular Rate:Normal     Neuro/Psych  PSYCHIATRIC DISORDERS  Depression    negative neurological ROS     GI/Hepatic ,GERD  ,,(+)     substance abuse (chronic pain)  cocaine use  Endo/Other  negative endocrine ROS    Renal/GU negative Renal ROS  negative genitourinary   Musculoskeletal  (+) Arthritis ,  narcotic dependent  Abdominal   Peds  Hematology negative hematology ROS (+)   Anesthesia Other Findings   Reproductive/Obstetrics                             Anesthesia Physical Anesthesia Plan  ASA: 3  Anesthesia Plan: General   Post-op Pain Management: Tylenol PO (pre-op)*   Induction: Intravenous  PONV Risk Score and Plan: 2 and Midazolam, Dexamethasone and Ondansetron  Airway Management Planned: Oral ETT  Additional Equipment:   Intra-op Plan:   Post-operative Plan: Extubation in OR  Informed Consent: I have reviewed the patients History and Physical, chart, labs and discussed the procedure including the risks, benefits and alternatives for the proposed anesthesia with the patient or authorized representative who has indicated his/her understanding and acceptance.     Dental advisory given  Plan Discussed with: CRNA  Anesthesia Plan Comments:        Anesthesia Quick Evaluation

## 2022-12-15 NOTE — Anesthesia Postprocedure Evaluation (Signed)
Anesthesia Post Note  Patient: [name]  Procedure(s) Performed: CANCELLED PROCEDURE     Patient location during evaluation: PACU Anesthesia Type: General Level of consciousness: awake and alert Pain management: pain level controlled Vital Signs Assessment: post-procedure vital signs reviewed and stable Respiratory status: spontaneous breathing, nonlabored ventilation, respiratory function stable and patient connected to nasal cannula oxygen Cardiovascular status: blood pressure returned to baseline and stable Postop Assessment: no apparent nausea or vomiting Anesthetic complications: yes Comments: Procedure cancelled 2/2 difficulty ventilating after induction. See intra-op record for details. Able to extubate in the OR without complication. Pt received duoneb and pulmonary toileting in PACU. Pt able to maintain oxygen saturations on room air. No increased work of breathing. Pt amenable to being discharged home. Pt will be referred to pulmonary as an outpatient.     Encounter Notable Events  Notable Event Outcome Phase Comment  Bronchospasm  Intraprocedure Pt extubated and taken to PACU. Case aborted.    Last Vitals:  Vitals:   12/14/22 1315 12/14/22 1345  BP: 120/71 114/70  Pulse: 63 62  Resp: 13 12  Temp:  36.4 C  SpO2: 94% 93%    Last Pain:  Vitals:   12/14/22 1345  TempSrc:   PainSc: 0-No pain   Pain Goal:                   Anselmo Reihl L Vaeda Westall

## 2022-12-27 ENCOUNTER — Encounter: Payer: Medicaid Other | Admitting: Student

## 2023-01-02 ENCOUNTER — Encounter: Payer: Self-pay | Admitting: Student

## 2023-01-09 ENCOUNTER — Other Ambulatory Visit: Payer: Self-pay

## 2023-01-09 DIAGNOSIS — N400 Enlarged prostate without lower urinary tract symptoms: Secondary | ICD-10-CM

## 2023-01-09 DIAGNOSIS — R339 Retention of urine, unspecified: Secondary | ICD-10-CM

## 2023-01-11 ENCOUNTER — Ambulatory Visit (HOSPITAL_BASED_OUTPATIENT_CLINIC_OR_DEPARTMENT_OTHER): Payer: Medicaid Other | Admitting: Pulmonary Disease

## 2023-01-11 ENCOUNTER — Emergency Department (HOSPITAL_BASED_OUTPATIENT_CLINIC_OR_DEPARTMENT_OTHER): Payer: Medicaid Other | Admitting: Radiology

## 2023-01-11 ENCOUNTER — Encounter (HOSPITAL_BASED_OUTPATIENT_CLINIC_OR_DEPARTMENT_OTHER): Payer: Self-pay | Admitting: Emergency Medicine

## 2023-01-11 ENCOUNTER — Other Ambulatory Visit: Payer: Self-pay

## 2023-01-11 ENCOUNTER — Encounter (HOSPITAL_BASED_OUTPATIENT_CLINIC_OR_DEPARTMENT_OTHER): Payer: Self-pay | Admitting: Pulmonary Disease

## 2023-01-11 ENCOUNTER — Inpatient Hospital Stay (HOSPITAL_BASED_OUTPATIENT_CLINIC_OR_DEPARTMENT_OTHER)
Admission: EM | Admit: 2023-01-11 | Discharge: 2023-01-16 | DRG: 190 | Disposition: A | Discharge status: Home / Self Care | Payer: Medicaid Other | Attending: Internal Medicine | Admitting: Internal Medicine

## 2023-01-11 ENCOUNTER — Emergency Department (HOSPITAL_BASED_OUTPATIENT_CLINIC_OR_DEPARTMENT_OTHER): Payer: Medicaid Other

## 2023-01-11 DIAGNOSIS — J45901 Unspecified asthma with (acute) exacerbation: Secondary | ICD-10-CM

## 2023-01-11 DIAGNOSIS — T17500A Unspecified foreign body in bronchus causing asphyxiation, initial encounter: Secondary | ICD-10-CM

## 2023-01-11 DIAGNOSIS — Z886 Allergy status to analgesic agent status: Secondary | ICD-10-CM

## 2023-01-11 DIAGNOSIS — N400 Enlarged prostate without lower urinary tract symptoms: Secondary | ICD-10-CM | POA: Diagnosis present

## 2023-01-11 DIAGNOSIS — Z87891 Personal history of nicotine dependence: Secondary | ICD-10-CM

## 2023-01-11 DIAGNOSIS — Z7951 Long term (current) use of inhaled steroids: Secondary | ICD-10-CM

## 2023-01-11 DIAGNOSIS — Z6833 Body mass index (BMI) 33.0-33.9, adult: Secondary | ICD-10-CM

## 2023-01-11 DIAGNOSIS — Z79899 Other long term (current) drug therapy: Secondary | ICD-10-CM

## 2023-01-11 DIAGNOSIS — Z9049 Acquired absence of other specified parts of digestive tract: Secondary | ICD-10-CM

## 2023-01-11 DIAGNOSIS — K219 Gastro-esophageal reflux disease without esophagitis: Secondary | ICD-10-CM | POA: Diagnosis present

## 2023-01-11 DIAGNOSIS — I2510 Atherosclerotic heart disease of native coronary artery without angina pectoris: Secondary | ICD-10-CM | POA: Diagnosis present

## 2023-01-11 DIAGNOSIS — E669 Obesity, unspecified: Secondary | ICD-10-CM | POA: Diagnosis present

## 2023-01-11 DIAGNOSIS — R0602 Shortness of breath: Secondary | ICD-10-CM | POA: Diagnosis not present

## 2023-01-11 DIAGNOSIS — Z1152 Encounter for screening for COVID-19: Secondary | ICD-10-CM

## 2023-01-11 DIAGNOSIS — I10 Essential (primary) hypertension: Secondary | ICD-10-CM | POA: Diagnosis not present

## 2023-01-11 DIAGNOSIS — G894 Chronic pain syndrome: Secondary | ICD-10-CM | POA: Diagnosis present

## 2023-01-11 DIAGNOSIS — J441 Chronic obstructive pulmonary disease with (acute) exacerbation: Secondary | ICD-10-CM | POA: Diagnosis not present

## 2023-01-11 DIAGNOSIS — J4489 Other specified chronic obstructive pulmonary disease: Secondary | ICD-10-CM

## 2023-01-11 DIAGNOSIS — E785 Hyperlipidemia, unspecified: Secondary | ICD-10-CM | POA: Diagnosis present

## 2023-01-11 DIAGNOSIS — J9601 Acute respiratory failure with hypoxia: Secondary | ICD-10-CM | POA: Diagnosis not present

## 2023-01-11 DIAGNOSIS — Z888 Allergy status to other drugs, medicaments and biological substances status: Secondary | ICD-10-CM

## 2023-01-11 DIAGNOSIS — R0600 Dyspnea, unspecified: Secondary | ICD-10-CM | POA: Diagnosis not present

## 2023-01-11 DIAGNOSIS — R059 Cough, unspecified: Secondary | ICD-10-CM | POA: Diagnosis not present

## 2023-01-11 DIAGNOSIS — Z800 Family history of malignant neoplasm of digestive organs: Secondary | ICD-10-CM

## 2023-01-11 DIAGNOSIS — Z811 Family history of alcohol abuse and dependence: Secondary | ICD-10-CM

## 2023-01-11 LAB — POCT I-STAT EG7
Acid-Base Excess: 5 mmol/L — ABNORMAL HIGH (ref 0.0–2.0)
Bicarbonate: 33.6 mmol/L — ABNORMAL HIGH (ref 20.0–28.0)
Calcium, Ion: 1.17 mmol/L (ref 1.15–1.40)
HCT: 50 % (ref 39.0–52.0)
Hemoglobin: 17 g/dL (ref 13.0–17.0)
O2 Saturation: 43 %
Potassium: 3.6 mmol/L (ref 3.5–5.1)
Sodium: 142 mmol/L (ref 135–145)
TCO2: 35 mmol/L — ABNORMAL HIGH (ref 22–32)
pCO2, Ven: 62.3 mmHg — ABNORMAL HIGH (ref 44–60)
pH, Ven: 7.34 (ref 7.25–7.43)
pO2, Ven: 26 mmHg — CL (ref 32–45)

## 2023-01-11 LAB — CBC WITH DIFFERENTIAL/PLATELET
Abs Immature Granulocytes: 0.01 10*3/uL (ref 0.00–0.07)
Basophils Absolute: 0 10*3/uL (ref 0.0–0.1)
Basophils Relative: 1 %
Eosinophils Absolute: 0.5 10*3/uL (ref 0.0–0.5)
Eosinophils Relative: 8 %
HCT: 48.4 % (ref 39.0–52.0)
Hemoglobin: 15.3 g/dL (ref 13.0–17.0)
Immature Granulocytes: 0 %
Lymphocytes Relative: 35 %
Lymphs Abs: 2.3 10*3/uL (ref 0.7–4.0)
MCH: 28.4 pg (ref 26.0–34.0)
MCHC: 31.6 g/dL (ref 30.0–36.0)
MCV: 90 fL (ref 80.0–100.0)
Monocytes Absolute: 0.6 10*3/uL (ref 0.1–1.0)
Monocytes Relative: 9 %
Neutro Abs: 3.1 10*3/uL (ref 1.7–7.7)
Neutrophils Relative %: 47 %
Platelets: 286 10*3/uL (ref 150–400)
RBC: 5.38 MIL/uL (ref 4.22–5.81)
RDW: 15.5 % (ref 11.5–15.5)
WBC: 6.5 10*3/uL (ref 4.0–10.5)
nRBC: 0 % (ref 0.0–0.2)

## 2023-01-11 LAB — BASIC METABOLIC PANEL
Anion gap: 8 (ref 5–15)
BUN: 18 mg/dL (ref 8–23)
CO2: 31 mmol/L (ref 22–32)
Calcium: 9.6 mg/dL (ref 8.9–10.3)
Chloride: 102 mmol/L (ref 98–111)
Creatinine, Ser: 1.27 mg/dL — ABNORMAL HIGH (ref 0.61–1.24)
GFR, Estimated: >60 mL/min (ref >60)
Glucose, Bld: 102 mg/dL — ABNORMAL HIGH (ref 70–99)
Potassium: 3.8 mmol/L (ref 3.5–5.1)
Sodium: 141 mmol/L (ref 135–145)

## 2023-01-11 LAB — PHOSPHORUS: Phosphorus: 3.2 mg/dL (ref 2.5–4.6)

## 2023-01-11 LAB — MAGNESIUM: Magnesium: 1.9 mg/dL (ref 1.7–2.4)

## 2023-01-11 LAB — SARS CORONAVIRUS 2 BY RT PCR: SARS Coronavirus 2 by RT PCR: NEGATIVE

## 2023-01-11 LAB — TROPONIN I (HIGH SENSITIVITY): Troponin I (High Sensitivity): 5 ng/L (ref <18)

## 2023-01-11 LAB — D-DIMER, QUANTITATIVE: D-Dimer, Quant: 0.38 ug/mL-FEU (ref 0.00–0.50)

## 2023-01-11 LAB — BRAIN NATRIURETIC PEPTIDE: B Natriuretic Peptide: 18.9 pg/mL (ref 0.0–100.0)

## 2023-01-11 MED ORDER — BREZTRI AEROSPHERE 160-9-4.8 MCG/ACT IN AERO: 2.0000 | INHALATION_SPRAY | Freq: Two times a day (BID) | RESPIRATORY_TRACT | 0 refills | Status: DC

## 2023-01-11 MED ORDER — ALBUTEROL SULFATE (2.5 MG/3ML) 0.083% IN NEBU
2.5000 mg | INHALATION_SOLUTION | Freq: Four times a day (QID) | RESPIRATORY_TRACT | 5 refills | Status: DC | PRN
Start: 2023-01-11 — End: 2023-02-10

## 2023-01-11 MED ORDER — OXYCODONE HCL 5 MG PO TABS
5.0000 mg | ORAL_TABLET | ORAL | Status: DC | PRN
  Administered 2023-01-12: 5 mg via ORAL
  Filled 2023-01-11: qty 1

## 2023-01-11 MED ORDER — SODIUM CHLORIDE 0.9% FLUSH
3.0000 mL | Freq: Two times a day (BID) | INTRAVENOUS | Status: DC
  Administered 2023-01-11 – 2023-01-16 (×10): 3 mL via INTRAVENOUS

## 2023-01-11 MED ORDER — ACETAMINOPHEN 325 MG PO TABS: 650.0000 mg | ORAL_TABLET | Freq: Four times a day (QID) | ORAL | Status: DC | PRN

## 2023-01-11 MED ORDER — AMLODIPINE BESYLATE 5 MG PO TABS
10.0000 mg | ORAL_TABLET | Freq: Every day | ORAL | Status: DC
  Administered 2023-01-12: 10 mg via ORAL
  Filled 2023-01-11: qty 2

## 2023-01-11 MED ORDER — METHYLPREDNISOLONE SODIUM SUCC 125 MG IJ SOLR
125.0000 mg | Freq: Two times a day (BID) | INTRAMUSCULAR | Status: AC
  Administered 2023-01-11 – 2023-01-12 (×2): 125 mg via INTRAVENOUS
  Filled 2023-01-11 (×2): qty 2

## 2023-01-11 MED ORDER — ACETAMINOPHEN 650 MG RE SUPP: 650.0000 mg | Freq: Four times a day (QID) | RECTAL | Status: DC | PRN

## 2023-01-11 MED ORDER — IPRATROPIUM-ALBUTEROL 0.5-2.5 (3) MG/3ML IN SOLN
3.0000 mL | Freq: Once | RESPIRATORY_TRACT | Status: DC
  Filled 2023-01-11: qty 3

## 2023-01-11 MED ORDER — ENOXAPARIN SODIUM 40 MG/0.4ML IJ SOSY
40.0000 mg | PREFILLED_SYRINGE | Freq: Every day | INTRAMUSCULAR | Status: DC
  Administered 2023-01-11 – 2023-01-15 (×5): 40 mg via SUBCUTANEOUS
  Filled 2023-01-11 (×5): qty 0.4

## 2023-01-11 MED ORDER — SODIUM CHLORIDE 0.9 % IV SOLN
2.0000 g | INTRAVENOUS | Status: AC
  Administered 2023-01-11 – 2023-01-15 (×5): 2 g via INTRAVENOUS
  Filled 2023-01-11 (×5): qty 20

## 2023-01-11 MED ORDER — SPIRIVA RESPIMAT 2.5 MCG/ACT IN AERS
2.0000 | INHALATION_SPRAY | Freq: Every day | RESPIRATORY_TRACT | 5 refills | Status: DC
Start: 2023-01-11 — End: 2023-01-13

## 2023-01-11 MED ORDER — METHYLPREDNISOLONE SODIUM SUCC 125 MG IJ SOLR
125.0000 mg | Freq: Once | INTRAMUSCULAR | Status: AC
  Administered 2023-01-11: 125 mg via INTRAVENOUS
  Filled 2023-01-11: qty 2

## 2023-01-11 MED ORDER — PREDNISONE 20 MG PO TABS: 40.0000 mg | ORAL_TABLET | Freq: Every day | ORAL | Status: DC

## 2023-01-11 MED ORDER — POLYETHYLENE GLYCOL 3350 17 G PO PACK: 17.0000 g | PACK | Freq: Every day | ORAL | Status: DC | PRN

## 2023-01-11 MED ORDER — IPRATROPIUM-ALBUTEROL 0.5-2.5 (3) MG/3ML IN SOLN
3.0000 mL | Freq: Once | RESPIRATORY_TRACT | Status: AC
  Administered 2023-01-11: 3 mL via RESPIRATORY_TRACT
  Filled 2023-01-11: qty 3

## 2023-01-11 MED ORDER — LOSARTAN POTASSIUM 50 MG PO TABS
100.0000 mg | ORAL_TABLET | Freq: Every day | ORAL | Status: DC
  Administered 2023-01-12 – 2023-01-16 (×5): 100 mg via ORAL
  Filled 2023-01-11 (×5): qty 2

## 2023-01-11 MED ORDER — ATORVASTATIN CALCIUM 40 MG PO TABS
40.0000 mg | ORAL_TABLET | Freq: Every day | ORAL | Status: DC
  Administered 2023-01-12 – 2023-01-16 (×5): 40 mg via ORAL
  Filled 2023-01-11 (×4): qty 1
  Filled 2023-01-11: qty 2

## 2023-01-11 MED ORDER — ONDANSETRON HCL 4 MG/2ML IJ SOLN: 4.0000 mg | Freq: Four times a day (QID) | INTRAMUSCULAR | Status: DC | PRN

## 2023-01-11 MED ORDER — TAMSULOSIN HCL 0.4 MG PO CAPS
0.4000 mg | ORAL_CAPSULE | Freq: Every day | ORAL | Status: DC
  Administered 2023-01-12 – 2023-01-16 (×5): 0.4 mg via ORAL
  Filled 2023-01-11 (×5): qty 1

## 2023-01-11 MED ORDER — ALBUTEROL SULFATE (2.5 MG/3ML) 0.083% IN NEBU: 2.5000 mg | INHALATION_SOLUTION | RESPIRATORY_TRACT | Status: DC | PRN

## 2023-01-11 MED ORDER — BUDESONIDE-FORMOTEROL FUMARATE 160-4.5 MCG/ACT IN AERO
2.0000 | INHALATION_SPRAY | Freq: Two times a day (BID) | RESPIRATORY_TRACT | 5 refills | Status: DC
Start: 2023-01-11 — End: 2023-01-13

## 2023-01-11 MED ORDER — ONDANSETRON HCL 4 MG PO TABS: 4.0000 mg | ORAL_TABLET | Freq: Four times a day (QID) | ORAL | Status: DC | PRN

## 2023-01-11 MED ORDER — IPRATROPIUM-ALBUTEROL 0.5-2.5 (3) MG/3ML IN SOLN
3.0000 mL | RESPIRATORY_TRACT | Status: AC
  Administered 2023-01-11 (×3): 3 mL via RESPIRATORY_TRACT
  Filled 2023-01-11: qty 6

## 2023-01-11 MED ORDER — IPRATROPIUM-ALBUTEROL 0.5-2.5 (3) MG/3ML IN SOLN
3.0000 mL | Freq: Four times a day (QID) | RESPIRATORY_TRACT | Status: DC
  Administered 2023-01-11 – 2023-01-13 (×7): 3 mL via RESPIRATORY_TRACT
  Filled 2023-01-11 (×7): qty 3

## 2023-01-11 MED ORDER — MAGNESIUM SULFATE 2 GM/50ML IV SOLN
2.0000 g | Freq: Once | INTRAVENOUS | Status: AC
  Administered 2023-01-11: 2 g via INTRAVENOUS
  Filled 2023-01-11: qty 50

## 2023-01-11 NOTE — H&P (Addendum)
History and Physical    [name] YQM:[id number] DOB: 1961-02-22 DOA: 01/11/2023  PCP: Belva Agee, MD   Patient coming from: Home   Chief Complaint: SOB   HPI: [name] is a pleasant 62 y.o. male with medical history significant for hypertension, hyperlipidemia, chronic pain, and COPD/asthma who presents from his pulmonology clinic with shortness of breath and acute hypoxic respiratory failure.  He reports worsening SOB over the past month but particularly over the past 1-2 days. There is wheezing and productive cough associated with this but he denies, chest pain, leg swelling, or fever.    [facility] ED Course: Upon arrival to the ED, patient is found to be afebrile and saturating low 80s on room air with normal heart rate and stable blood pressure.  EKG demonstrates sinus rhythm and chest CT reveals persistent mucoid impaction that is similar to the prior study.  Labs are notable for creatinine 1.27, normal WBC, normal BNP, normal D-dimer, normal troponin, and negative COVID-19 PCR.  Patient was treated in the ED with IV magnesium, IV Solu-Medrol, and 2 DuoNeb's.  Pulmonology Brett Canales Minor, NP) was consulted by the ED physician and the patient was transferred to [facility] for admission.  Review of Systems:  All other systems reviewed and apart from HPI, are negative.  Past Medical History:  Diagnosis Date   Allergic rhinitis 04/25/2012   Arthritis    Asthma, chronic 10/21/2010   Followed in Pulmonary clinic/ [facility]/ [place]   - HFA 50% 01/04/2011  > 90% 02/04/2011   - PFT's wnl 02/04/2011  - no intubation before  - has been hospitalized once for asthma in 2012 - quit smoking in 2012 - allergic to aspirin and pollen   10/11/2014>>Repeat pulmonary function tests. Performed for disability application FEV1/FVC=68.5%, FEV1 48%. Consistent with COPD, severe Please see results under media    Chronic pain syndrome 02/03/2011   Involving the shoulder joint   05/20/2013: right shoulder x ray >>Degenerative changes AC joint Seen by PT by 11/2013 >> improvement 03/13/2014 D/c from PT due to orange card being expired and pt cancelling appt    Complication of anesthesia    slow to wake up   COPD (chronic obstructive pulmonary disease) (HCC)    exas 6/13, potentially due to ASA use.    Dyspnea    GERD (gastroesophageal reflux disease)    Hyperlipidemia    Hypertension    Obesity (BMI 30-39.9)    Pneumonia 09/2022   Substance abuse (HCC)    cocaine 35 years ago    Past Surgical History:  Procedure Laterality Date   ALVEOLOPLASTY N/A 05/12/2020   Procedure: ALVEOLOPLASTY;  Surgeon: Ocie Doyne, DDS;  Location:  SURGERY CENTER;  Service: Oral Surgery;  Laterality: N/A;   CHOLECYSTECTOMY  07/30/89   PELVIC FRACTURE SURGERY  unknown   required surgery on his bladder   TOOTH EXTRACTION N/A 05/12/2020   Procedure: DENTAL RESTORATION/EXTRACTIONS;  Surgeon: Ocie Doyne, DDS;  Location:  SURGERY CENTER;  Service: Oral Surgery;  Laterality: N/A;    Social History:   reports that he quit smoking about 13 years ago. His smoking use included cigarettes. He has a 40.00 pack-year smoking history. He has never used smokeless tobacco. He reports that he does not use drugs. No history on file for alcohol use.  Allergies  Allergen Reactions   Aspirin Shortness Of Breath   Nsaids Shortness Of Breath    Family History  Problem Relation Age of Onset  Pancreatic cancer Mother    Alcohol abuse Father    Sarcoidosis Brother      Prior to Admission medications   Medication Sig Start Date End Date Taking? Authorizing Provider  albuterol (PROVENTIL) (2.5 MG/3ML) 0.083% nebulizer solution Take 3 mLs (2.5 mg total) by nebulization every 6 (six) hours as needed for wheezing or shortness of breath. 01/11/23   Luciano Cutter, MD  amLODipine (NORVASC) 10 MG tablet Take 1 tablet (10 mg total) by mouth daily. TAKE 1 TABLET(10 MG) BY MOUTH  DAILY Strength: 10 mg 09/27/22 12/26/22  Willette Cluster, MD  atorvastatin (LIPITOR) 40 MG tablet Take 1 tablet (40 mg total) by mouth daily. 09/27/22 09/27/23  Willette Cluster, MD  Budeson-Glycopyrrol-Formoterol (BREZTRI AEROSPHERE) 160-9-4.8 MCG/ACT AERO Inhale 2 puffs into the lungs in the morning and at bedtime. 01/11/23   Luciano Cutter, MD  budesonide-formoterol [facility]) 160-4.5 MCG/ACT inhaler Inhale 2 puffs into the lungs 2 (two) times daily. 01/11/23 02/10/23  Luciano Cutter, MD  fluticasone (FLONASE) 50 MCG/ACT nasal spray Place 1 spray into both nostrils daily. Patient not taking: Reported on 12/06/2022 10/07/22   Belva Agee, MD  losartan (COZAAR) 100 MG tablet Take 1 tablet (100 mg total) by mouth daily. 09/27/22   Willette Cluster, MD  pregabalin (LYRICA) 100 MG capsule TAKE 1 CAPSULE(100 MG) BY MOUTH THREE TIMES DAILY Patient taking differently: Take 100 mg by mouth daily as needed (pain). TAKE 1 CAPSULE(100 MG) BY MOUTH THREE TIMES DAILY 10/07/22   Belva Agee, MD  tamsulosin (FLOMAX) 0.4 MG CAPS capsule TAKE 1 CAPSULE(0.4 MG) BY MOUTH DAILY 01/10/23   Katsadouros, Vasilios, MD  Tiotropium Bromide Monohydrate (SPIRIVA RESPIMAT) 2.5 MCG/ACT AERS Inhale 2 puffs into the lungs daily. 01/11/23 02/10/23  Luciano Cutter, MD  VENTOLIN HFA 108 [phone] Base) MCG/ACT inhaler INHALE 2 PUFFS INTO THE LUNGS EVERY 6 HOURS AS NEEDED FOR WHEEZING OR SHORTNESS OF BREATH 12/01/22   Belva Agee, MD    Physical Exam: Vitals:   01/11/23 1730 01/11/23 1800 01/11/23 1855 01/11/23 1900  BP: 121/67 128/74 (!) 140/75   Pulse: 79 80 74   Resp: 14 14 20 17   Temp:   (!) 97.5 F (36.4 C)   TempSrc:      SpO2: 96% 91% 93%     Constitutional: NAD, calm  Eyes: PERTLA, lids and conjunctivae normal ENMT: Mucous membranes are moist. Posterior pharynx clear of any exudate or lesions.   Neck: supple, no masses  Respiratory: Diminished breath sounds with prolonged expiratory phase and wheezing.  Dyspneic with speech.   Cardiovascular: S1 & S2 heard, regular rate and rhythm. No extremity edema.  Abdomen: No distension, no tenderness, soft. Bowel sounds active.  Musculoskeletal: no clubbing / cyanosis. No joint deformity upper and lower extremities.   Skin: no significant rashes, lesions, ulcers. Warm, dry, well-perfused. Neurologic: CN 2-12 grossly intact. Moving all extremities. Alert and oriented.  Psychiatric: Pleasant. Cooperative.    Labs and Imaging on Admission: I have personally reviewed following labs and imaging studies  CBC: Recent Labs  Lab 01/11/23 0938 01/11/23 1037  WBC 6.5  --   NEUTROABS 3.1  --   HGB 15.3 17.0  HCT 48.4 50.0  MCV 90.0  --   PLT 286  --    Basic Metabolic Panel: Recent Labs  Lab 01/11/23 0938 01/11/23 1037  NA 141 142  K 3.8 3.6  CL 102  --   CO2 31  --   GLUCOSE 102*  --  BUN 18  --   CREATININE 1.27*  --   CALCIUM 9.6  --   MG 1.9  --   PHOS 3.2  --    GFR: Estimated Creatinine Clearance: 73.9 mL/min (A) (by C-G formula based on SCr of 1.27 mg/dL (H)). Liver Function Tests: No results for input(s): "AST", "ALT", "ALKPHOS", "BILITOT", "PROT", "ALBUMIN" in the last 168 hours. No results for input(s): "LIPASE", "AMYLASE" in the last 168 hours. No results for input(s): "AMMONIA" in the last 168 hours. Coagulation Profile: No results for input(s): "INR", "PROTIME" in the last 168 hours. Cardiac Enzymes: No results for input(s): "CKTOTAL", "CKMB", "CKMBINDEX", "TROPONINI" in the last 168 hours. BNP (last 3 results) No results for input(s): "PROBNP" in the last 8760 hours. HbA1C: No results for input(s): "HGBA1C" in the last 72 hours. CBG: No results for input(s): "GLUCAP" in the last 168 hours. Lipid Profile: No results for input(s): "CHOL", "HDL", "LDLCALC", "TRIG", "CHOLHDL", "LDLDIRECT" in the last 72 hours. Thyroid Function Tests: No results for input(s): "TSH", "T4TOTAL", "FREET4", "T3FREE", "THYROIDAB" in the last  72 hours. Anemia Panel: No results for input(s): "VITAMINB12", "FOLATE", "FERRITIN", "TIBC", "IRON", "RETICCTPCT" in the last 72 hours. Urine analysis:    Component Value Date/Time   COLORURINE YELLOW 02/26/2014 1459   APPEARANCEUR Clear 03/18/2022 1104   LABSPEC 1.025 02/26/2014 1459   PHURINE 6.0 02/26/2014 1459   GLUCOSEU Negative 03/18/2022 1104   HGBUR NEG 02/26/2014 1459   BILIRUBINUR Negative 03/18/2022 1104   KETONESUR NEG 02/26/2014 1459   PROTEINUR Trace 03/18/2022 1104   PROTEINUR NEG 02/26/2014 1459   UROBILINOGEN 0.2 02/26/2014 1459   NITRITE Negative 03/18/2022 1104   NITRITE NEG 02/26/2014 1459   LEUKOCYTESUR Negative 03/18/2022 1104   Sepsis Labs: @LABRCNTIP (procalcitonin:4,lacticidven:4) ) Recent Results (from the past 240 hour(s))  SARS Coronavirus 2 by RT PCR (hospital order, performed in [facility] hospital lab) *cepheid single result test* Anterior Nasal Swab     Status: None   Collection Time: 01/11/23  9:39 AM   Specimen: Anterior Nasal Swab  Result Value Ref Range Status   SARS Coronavirus 2 by RT PCR NEGATIVE NEGATIVE Final    Comment: (NOTE) SARS-CoV-2 target nucleic acids are NOT DETECTED.  The SARS-CoV-2 RNA is generally detectable in upper and lower respiratory specimens during the acute phase of infection. The lowest concentration of SARS-CoV-2 viral copies this assay can detect is 250 copies / mL. A negative result does not preclude SARS-CoV-2 infection and should not be used as the sole basis for treatment or other patient management decisions.  A negative result may occur with improper specimen collection / handling, submission of specimen other than nasopharyngeal swab, presence of viral mutation(s) within the areas targeted by this assay, and inadequate number of viral copies (<250 copies / mL). A negative result must be combined with clinical observations, patient history, and epidemiological information.  Fact Sheet for Patients:    RoadLapTop.co.za  Fact Sheet for Healthcare Providers: http://kim-miller.com/  This test is not yet approved or  cleared by the [place] FDA and has been authorized for detection and/or diagnosis of SARS-CoV-2 by FDA under an Emergency Use Authorization (EUA).  This EUA will remain in effect (meaning this test can be used) for the duration of the COVID-19 declaration under Section 564(b)(1) of the Act, 21 U.S.C. section 360bbb-3(b)(1), unless the authorization is terminated or revoked sooner.  Performed at [facility], [address], [place], [place] [address]      Radiological Exams on  Admission: CT Chest Wo Contrast  Result Date: 01/11/2023 CLINICAL DATA:  Dyspnea, chronic, unclear etiology mucoid impaction EXAM: CT CHEST WITHOUT CONTRAST TECHNIQUE: Multidetector CT imaging of the chest was performed following the standard protocol without IV contrast. RADIATION DOSE REDUCTION: This exam was performed according to the departmental dose-optimization program which includes automated exposure control, adjustment of the mA and/or kV according to patient size and/or use of iterative reconstruction technique. COMPARISON:  CT 07/17/2023 FINDINGS: Cardiovascular: Heart size within normal limits. No pericardial effusion. Thoracic aorta is nonaneurysmal. Scattered atherosclerotic vascular calcifications of the aorta and coronary arteries. Central pulmonary vasculature within normal limits. Mediastinum/Nodes: Mildly enlarged pretracheal and lower right paratracheal lymph nodes measuring 10 mm short axis (series 2, images 49 and 68). These are stable in size compared to the prior CT. No axillary lymphadenopathy. Evaluation of the hilar structures is limited in the absence of intravenous contrast. Within this limitation, no obvious hilar adenopathy or mass is identified. Thyroid gland and esophagus within normal limits. Small amount  of secretions within the trachea. Lungs/Pleura: Persistent findings of mucoid impaction, most pronounced within the medial aspect of the right lower lobe with less prominent involvement in the left lower lobe and right middle lobe. Appearance is similar in degree compared to the previous study. Lungs are otherwise clear. No pleural effusion or pneumothorax. Upper Abdomen: No acute abnormality. Musculoskeletal: No chest wall mass or suspicious bone lesions identified. 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 IMPRESSION: 1. Persistent findings of mucoid impaction, most pronounced within the medial aspect of the right lower lobe with less prominent involvement in the left lower lobe and right middle lobe. Appearance is similar in degree compared to the previous study. 2. Stable mildly enlarged pretracheal and lower right paratracheal lymph nodes, likely reactive. 3. Aortic and coronary artery atherosclerosis (ICD10-I70.0). Electronically Signed   By: Duanne Guess D.O.   On: 01/11/2023 13:12   DG Chest 2 View  Result Date: 01/11/2023 CLINICAL DATA:  Cough and shortness of breath EXAM: CHEST - 2 VIEW COMPARISON:  Chest radiograph dated 09/16/2022, CTA chest dated 09/16/2022 FINDINGS: Normal lung volumes. Confluent right basilar opacity likely corresponding to previously noted mucoid impaction. No pleural effusion or pneumothorax. The heart size and mediastinal contours are within normal limits. No acute osseous abnormality. IMPRESSION: Confluent right basilar opacity likely corresponding to previously noted mucoid impaction. Electronically Signed   By: Agustin Cree M.D.   On: 01/11/2023 10:35    EKG: Independently reviewed. Sinus rhythm.   Assessment/Plan   1. Acute COPD/asthma exacerbation; acute hypoxic respiratory failure  - Culture sputum, start antibiotic, continue systemic steroids, schedule DuoNebs, use additional SABA as needed, continue supplemental  O2 as needed    2. Hypertension  - Continue Norvasc and losartan   3. Hyperlipidemia  - Continue Lipitor    DVT prophylaxis: Lovenox  Code Status: Full  Level of Care: Level of care: Progressive Family Communication: none present  Disposition Plan:  Patient is from: home  Anticipated d/c is to: Home  Anticipated d/c date is: 01/13/23  Patient currently: Pending stable respiratory status, may need home O2  Consults called: Pulmonology  Admission status: Observation     Briscoe Deutscher, MD [facility]  01/11/2023, 7:28 PM

## 2023-01-11 NOTE — Progress Notes (Signed)
Plan of Care Note for accepted transfer   Patient: [name] MRN: [id number]   DOA: 01/11/2023  Facility requesting transfer: [facility]. Requesting Provider: Ernie Avena MD. Reason for transfer: Acute respiratory failure with hypoxia. Facility course:  Per Dr. Hart Rochester: "Chief Complaint  Patient presents with   Shortness of Breath    [name] is a 62 y.o. male. Shortness of Breath Associated symptoms: cough      62 year old male with medical history significant for COPD, obesity, chronic asthma, HTN, HLD, GERD who presents to the emergency department today with hypoxic respiratory failure.  The patient was initially seen by his pulmonologist in pulmonology clinic this morning and was sent to the emergency department due to oxygen saturations in the 70s.  He was diffusely wheezing and there was concern for an asthma/COPD exacerbation.  He states that he has had difficulty breathing at night and difficulty lying flat having to lie on his left side, unable to fully lie on his right side at night.  This has been ongoing for the last month or so.  He did undergo CT imaging during a previous COPD exacerbation hospitalization back in February 2024 which showed mucoid impaction in the lungs bilaterally.  He denies any chest pain.  He continues to endorse dyspnea.  He denies any lower extremity swelling.  He denies any history of heart failure. He does state that he has been having a cough more productive of sputum over the past week."  Lab work: Brain natriuretic peptide [[id number]]   Collected: 01/11/23 0938   Updated: 01/11/23 1135   Specimen Type: Blood    B Natriuretic Peptide 18.9 pg/mL  SARS Coronavirus 2 by RT PCR (hospital order, performed in [facility] hospital lab) *cepheid single result test* Anterior Nasal Swab [[id number]]   Collected: 01/11/23 0939   Updated: 01/11/23 1116   Specimen Source: Anterior Nasal Swab    SARS Coronavirus 2 by RT PCR NEGATIVE  Troponin I (High Sensitivity)  [[id number]]   Collected: 01/11/23 0938   Updated: 01/11/23 1111    Troponin I (High Sensitivity) 5 ng/L  Basic metabolic panel [[id number]] (Abnormal)   Collected: 01/11/23 0938   Updated: 01/11/23 1106   Specimen Type: Blood    Sodium 141 mmol/L   Potassium 3.8 mmol/L   Chloride 102 mmol/L   CO2 31 mmol/L   Glucose, Bld 102 High  mg/dL   BUN 18 mg/dL   Creatinine, Ser 0.63 High  mg/dL   Calcium 9.6 mg/dL   GFR, Estimated >01 mL/min   Anion gap 8  D-dimer, quantitative [[id number]]   Collected: 01/11/23 0938   Updated: 01/11/23 1105   Specimen Type: Blood    D-Dimer, Quant 0.38 ug/mL-FEU  CBC with Differential [[id number]]   Collected: 01/11/23 0938   Updated: 01/11/23 1043   Specimen Type: Blood    WBC 6.5 K/uL   RBC 5.38 MIL/uL   Hemoglobin 15.3 g/dL   HCT 27.0 %   MCV 62.3 fL   MCH 28.4 pg   MCHC 31.6 g/dL   RDW 76.2 %   Platelets 286 K/uL   nRBC 0.0 %   Neutrophils Relative % 47 %   Neutro Abs 3.1 K/uL   Lymphocytes Relative 35 %   Lymphs Abs 2.3 K/uL   Monocytes Relative 9 %   Monocytes Absolute 0.6 K/uL   Eosinophils Relative 8 %   Eosinophils Absolute 0.5 K/uL   Basophils Relative 1 %   Basophils Absolute 0.0 K/uL   Immature  Granulocytes 0 %   Abs Immature Granulocytes 0.01 K/uL   Imaging: CHEST - 2 VIEW   COMPARISON:  Chest radiograph dated 09/16/2022, CTA chest dated 09/16/2022   FINDINGS: Normal lung volumes. Confluent right basilar opacity likely corresponding to previously noted mucoid impaction. No pleural effusion or pneumothorax. The heart size and mediastinal contours are within normal limits. No acute osseous abnormality.   IMPRESSION: Confluent right basilar opacity likely corresponding to previously noted mucoid impaction.   Electronically Signed   By: Agustin Cree M.D.   On: 01/11/2023 10:35  CT chest without contrast is still pending.  Plan of care: The patient is accepted for admission to Progressive unit, at [facility]..    Author: Bobette Mo, MD 01/11/2023  Check [email or web address] for on-call coverage.  Nursing staff, Please call [facility] Admits & Consults System-Wide number on Amion as soon as patient's arrival, so appropriate admitting provider can evaluate the pt.  This document was prepared using Dragon voice recognition software and may contain some unintended transcription errors.

## 2023-01-11 NOTE — ED Provider Notes (Signed)
[place] EMERGENCY DEPARTMENT AT [facility] Provider Note   CSN: [id number] Arrival date & time: 01/11/23  4098     History  Chief Complaint  Patient presents with   Shortness of Breath    [name] is a 62 y.o. male.   Shortness of Breath Associated symptoms: cough      62 year old male with medical history significant for COPD, obesity, chronic asthma, HTN, HLD, GERD who presents to the emergency department today with hypoxic respiratory failure.  The patient was initially seen by his pulmonologist in pulmonology clinic this morning and was sent to the emergency department due to oxygen saturations in the 70s.  He was diffusely wheezing and there was concern for an asthma/COPD exacerbation.  He states that he has had difficulty breathing at night and difficulty lying flat having to lie on his left side, unable to fully lie on his right side at night.  This has been ongoing for the last month or so.  He did undergo CT imaging during a previous COPD exacerbation hospitalization back in February 2024 which showed mucoid impaction in the lungs bilaterally.  He denies any chest pain.  He continues to endorse dyspnea.  He denies any lower extremity swelling.  He denies any history of heart failure. He does state that he has been having a cough more productive of sputum over the past week.  Home Medications Prior to Admission medications   Medication Sig Start Date End Date Taking? Authorizing Provider  albuterol (PROVENTIL) (2.5 MG/3ML) 0.083% nebulizer solution Take 3 mLs (2.5 mg total) by nebulization every 6 (six) hours as needed for wheezing or shortness of breath. 01/11/23   Luciano Cutter, MD  amLODipine (NORVASC) 10 MG tablet Take 1 tablet (10 mg total) by mouth daily. TAKE 1 TABLET(10 MG) BY MOUTH DAILY Strength: 10 mg 09/27/22 12/26/22  Willette Cluster, MD  atorvastatin (LIPITOR) 40 MG tablet Take 1 tablet (40 mg total) by mouth daily. 09/27/22 09/27/23  Willette Cluster, MD   Budeson-Glycopyrrol-Formoterol (BREZTRI AEROSPHERE) 160-9-4.8 MCG/ACT AERO Inhale 2 puffs into the lungs in the morning and at bedtime. 01/11/23   Luciano Cutter, MD  budesonide-formoterol [facility]) 160-4.5 MCG/ACT inhaler Inhale 2 puffs into the lungs 2 (two) times daily. 01/11/23 02/10/23  Luciano Cutter, MD  fluticasone (FLONASE) 50 MCG/ACT nasal spray Place 1 spray into both nostrils daily. Patient not taking: Reported on 12/06/2022 10/07/22   Belva Agee, MD  losartan (COZAAR) 100 MG tablet Take 1 tablet (100 mg total) by mouth daily. 09/27/22   Willette Cluster, MD  pregabalin (LYRICA) 100 MG capsule TAKE 1 CAPSULE(100 MG) BY MOUTH THREE TIMES DAILY Patient taking differently: Take 100 mg by mouth daily as needed (pain). TAKE 1 CAPSULE(100 MG) BY MOUTH THREE TIMES DAILY 10/07/22   Belva Agee, MD  tamsulosin (FLOMAX) 0.4 MG CAPS capsule TAKE 1 CAPSULE(0.4 MG) BY MOUTH DAILY 01/10/23   Katsadouros, Vasilios, MD  Tiotropium Bromide Monohydrate (SPIRIVA RESPIMAT) 2.5 MCG/ACT AERS Inhale 2 puffs into the lungs daily. 01/11/23 02/10/23  Luciano Cutter, MD  VENTOLIN HFA 108 (90 Base) MCG/ACT inhaler INHALE 2 PUFFS INTO THE LUNGS EVERY 6 HOURS AS NEEDED FOR WHEEZING OR SHORTNESS OF BREATH 12/01/22   Belva Agee, MD      Allergies    Aspirin and Nsaids    Review of Systems   Review of Systems  Respiratory:  Positive for cough and shortness of breath.   All other systems reviewed and are negative.  Physical Exam Updated Vital Signs BP 131/64 (BP Location: Right Arm)   Pulse 69   Temp 98.3 F (36.8 C) (Oral)   Resp 15   SpO2 100%  Physical Exam Vitals and nursing note reviewed.  Constitutional:      General: He is not in acute distress.    Appearance: He is well-developed.  HENT:     Head: Normocephalic and atraumatic.  Eyes:     Conjunctiva/sclera: Conjunctivae normal.  Cardiovascular:     Rate and Rhythm: Normal rate and regular rhythm.     Heart  sounds: No murmur heard. Pulmonary:     Effort: Pulmonary effort is normal. No respiratory distress.     Breath sounds: Examination of the right-upper field reveals wheezing. Examination of the left-upper field reveals wheezing. Examination of the right-middle field reveals wheezing. Examination of the left-middle field reveals wheezing. Examination of the right-lower field reveals wheezing. Examination of the left-lower field reveals wheezing. Wheezing present.  Abdominal:     Palpations: Abdomen is soft.     Tenderness: There is no abdominal tenderness.  Musculoskeletal:        General: No swelling.     Cervical back: Neck supple.  Skin:    General: Skin is warm and dry.     Capillary Refill: Capillary refill takes less than 2 seconds.  Neurological:     Mental Status: He is alert.  Psychiatric:        Mood and Affect: Mood normal.     ED Results / Procedures / Treatments   Labs (all labs ordered are listed, but only abnormal results are displayed) Labs Reviewed  BASIC METABOLIC PANEL - Abnormal; Notable for the following components:      Result Value   Glucose, Bld 102 (*)    Creatinine, Ser 1.27 (*)    All other components within normal limits  SARS CORONAVIRUS 2 BY RT PCR  CBC WITH DIFFERENTIAL/PLATELET  BRAIN NATRIURETIC PEPTIDE  D-DIMER, QUANTITATIVE  MAGNESIUM  PHOSPHORUS  BLOOD GAS, VENOUS  TROPONIN I (HIGH SENSITIVITY)    EKG EKG Interpretation  Date/Time:  Wednesday January 11 2023 09:58:41 EDT Ventricular Rate:  75 PR Interval:  172 QRS Duration: 85 QT Interval:  420 QTC Calculation: 470 R Axis:   -2 Text Interpretation: Sinus rhythm Consider left atrial enlargement Confirmed by Ernie Avena ([id number]) on 01/11/2023 10:53:29 AM  Radiology CT Chest Wo Contrast  Result Date: 01/11/2023 CLINICAL DATA:  Dyspnea, chronic, unclear etiology mucoid impaction EXAM: CT CHEST WITHOUT CONTRAST TECHNIQUE: Multidetector CT imaging of the chest was performed following the  standard protocol without IV contrast. RADIATION DOSE REDUCTION: This exam was performed according to the departmental dose-optimization program which includes automated exposure control, adjustment of the mA and/or kV according to patient size and/or use of iterative reconstruction technique. COMPARISON:  CT 07/17/2023 FINDINGS: Cardiovascular: Heart size within normal limits. No pericardial effusion. Thoracic aorta is nonaneurysmal. Scattered atherosclerotic vascular calcifications of the aorta and coronary arteries. Central pulmonary vasculature within normal limits. Mediastinum/Nodes: Mildly enlarged pretracheal and lower right paratracheal lymph nodes measuring 10 mm short axis (series 2, images 49 and 68). These are stable in size compared to the prior CT. No axillary lymphadenopathy. Evaluation of the hilar structures is limited in the absence of intravenous contrast. Within this limitation, no obvious hilar adenopathy or mass is identified. Thyroid gland and esophagus within normal limits. Small amount of secretions within the trachea. Lungs/Pleura: Persistent findings of mucoid impaction, most pronounced within the medial aspect  of the right lower lobe with less prominent involvement in the left lower lobe and right middle lobe. Appearance is similar in degree compared to the previous study. Lungs are otherwise clear. No pleural effusion or pneumothorax. Upper Abdomen: No acute abnormality. Musculoskeletal: No chest wall mass or suspicious bone lesions identified. 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 IMPRESSION: 1. Persistent findings of mucoid impaction, most pronounced within the medial aspect of the right lower lobe with less prominent involvement in the left lower lobe and right middle lobe. Appearance is similar in degree compared to the previous study. 2. Stable mildly enlarged pretracheal and lower right paratracheal lymph nodes, likely reactive.  3. Aortic and coronary artery atherosclerosis (ICD10-I70.0). Electronically Signed   By: Duanne Guess D.O.   On: 01/11/2023 13:12   DG Chest 2 View  Result Date: 01/11/2023 CLINICAL DATA:  Cough and shortness of breath EXAM: CHEST - 2 VIEW COMPARISON:  Chest radiograph dated 09/16/2022, CTA chest dated 09/16/2022 FINDINGS: Normal lung volumes. Confluent right basilar opacity likely corresponding to previously noted mucoid impaction. No pleural effusion or pneumothorax. The heart size and mediastinal contours are within normal limits. No acute osseous abnormality. IMPRESSION: Confluent right basilar opacity likely corresponding to previously noted mucoid impaction. Electronically Signed   By: Agustin Cree M.D.   On: 01/11/2023 10:35    Procedures Procedures    Medications Ordered in ED Medications  methylPREDNISolone sodium succinate (SOLU-MEDROL) 125 mg/2 mL injection 125 mg (125 mg Intravenous Given 01/11/23 1048)  magnesium sulfate IVPB 2 g 50 mL (0 g Intravenous Stopped 01/11/23 1154)  ipratropium-albuterol (DUONEB) 0.5-2.5 (3) MG/3ML nebulizer solution 3 mL (3 mLs Nebulization Given 01/11/23 9528)    ED Course/ Medical Decision Making/ A&P                             Medical Decision Making Amount and/or Complexity of Data Reviewed Labs: ordered. Radiology: ordered.  Risk Prescription drug management. Decision regarding hospitalization.     62 year old male with medical history significant for COPD, obesity, chronic asthma, HTN, HLD, GERD who presents to the emergency department today with hypoxic respiratory failure.  The patient was initially seen by his pulmonologist in pulmonology clinic this morning and was sent to the emergency department due to oxygen saturations in the 70s.  He was diffusely wheezing and there was concern for an asthma/COPD exacerbation.  He states that he has had difficulty breathing at night and difficulty lying flat having to lie on his left side, unable  to fully lie on his right side at night.  This has been ongoing for the last month or so.  He did undergo CT imaging during a previous COPD exacerbation hospitalization back in February 2024 which showed mucoid impaction in the lungs bilaterally.  He denies any chest pain.  He continues to endorse dyspnea.  He denies any lower extremity swelling.  He denies any history of heart failure. He does state that he has been having a cough more productive of sputum over the past week.  On arrival, the patient was afebrile, not tachycardic or tachypneic, hemodynamically stable, blood pressure 125/78, saturating 82% on room air.  The patient initially refused nasal cannula but then subsequently consented to Venturi mask and breathing treatments.  On exam, the patient was found to be in normal sinus rhythm, sinus rhythm noted on cardiac telemetry, diffuse wheezing noted in all lung fields.  No lower  extremity edema was noted.  Patient was not tachypneic and not in respiratory distress but was notably hypoxic while resting comfortably on room air.  Differential diagnose includes COPD exacerbation, mucous plugging, pneumothorax, pneumonia, COVID-19, less likely ACS, less likely PE, anemia.  Initial EKG revealed sinus rhythm, ventricular rate 75, no acute ischemic changes noted.  A chest x-ray was performed which revealed a confluent right basilar opacity likely corresponding to a previously noted mucoid impaction.  Laboratory evaluation significant for COVID-19 PCR negative, D-dimer negative, BMP unremarkable with creatinine at baseline, BNP normal, initial troponin 5, CBC without a leukocytosis or anemia.  Patient was administered DuoNebs x 3 in addition to IV Solu-Medrol and IV magnesium.  He remains with an oxygen requirement in the emergency department in acute hypoxic respiratory failure.  I did speak with Dr. Everardo All who agreed with plan for admission, CT imaging and pulmonology consultation in the hospital.  I  spoke with Rober Minion, NP of pulmonary critical care who will see the patient in consultation, requested the hospitalist reach out when the patient gets admitted.  Hospitalist medicine consulted for admission for further management of asthma/COPD exacerbation.   CT Chest WO: IMPRESSION:  1. Persistent findings of mucoid impaction, most pronounced within  the medial aspect of the right lower lobe with less prominent  involvement in the left lower lobe and right middle lobe. Appearance  is similar in degree compared to the previous study.  2. Stable mildly enlarged pretracheal and lower right paratracheal  lymph nodes, likely reactive.  3. Aortic and coronary artery atherosclerosis (ICD10-I70.0).    The patient remained with an oxygen requirement, was updated regarding the plan of care, hospitalist medicine accepted the patient in admission, Dr. Robb Matar accepting     Final Clinical Impression(s) / ED Diagnoses Final diagnoses:  Acute respiratory failure with hypoxia (HCC)  COPD exacerbation (HCC)  Mucoid impaction of bronchi    Rx / DC Orders ED Discharge Orders     None         Ernie Avena, MD 01/11/23 1412

## 2023-01-11 NOTE — ED Notes (Signed)
Pt requested to talk to the provider about being admitted to the hospital... Provider informed.Marland KitchenMarland Kitchen

## 2023-01-11 NOTE — Addendum Note (Signed)
Addended by: Arlyss Repress on: 01/11/2023 10:25 AM   Modules accepted: Orders

## 2023-01-11 NOTE — ED Notes (Signed)
[name] will send transport for Bed Ready at [facility] Room# [id number].-ABB(NS)

## 2023-01-11 NOTE — ED Notes (Signed)
Report given to the floor RN.

## 2023-01-11 NOTE — ED Notes (Signed)
Report given to [facility]. 

## 2023-01-11 NOTE — ED Triage Notes (Signed)
Pt here from home with c/o sob that started last night along with a cough no chest pain hx of copd

## 2023-01-11 NOTE — ED Notes (Signed)
Venus blood gas performed by RT.Marland KitchenMarland Kitchen

## 2023-01-11 NOTE — Progress Notes (Signed)
Subjective:   PATIENT ID: [name] GENDER: male DOB: [birth year], MRN: [id number]   HPI  Chief Complaint  Patient presents with   Follow-up    Follow up.     Reason for Visit: Follow-up for asthma  [name] is a 62 year old male former smoker with asthma/COPD, chronic sinusitis with nasal polyps, HTN, HLD, BPH and CBP who presents for follow-up.  10/06/20 He reports his symptoms are currently well-controlled at this time. He has had at least 3 outpatient COPD exacerbations including last month he required steroids and antibiotics. This happens when he runs out of medications. Otherwise he is compliant with his Symbicort and Spiriva and now able to afford the inhalers. He still does have shortness of breath and wheezing with exertion. He does have a daily productive cough that is sometimes clear/light yellow. Denies fevers and chills. His symptoms limit his activity and he does feel fatigued.  01/11/23 He has been lost to follow-up for two years with COPD/asthma managed by IM resident program. Has remained on Symbicort and Spiriva. Referred to pulmonary rehab. He has had multiple exacerbations including hospitalizations this year in Feb 2024. He was discharged on Breo and Incruse but changed back to Symbicort and Spiriva due to affordability.  On 12/14/22 he was planned sinus surgery for ENT surgery for chronic pansinusitis with nasal polyps however cancelled due to difficulty ventilating after induction with concerns for bronchospasm. Extubated in OR and received duoneb and pulmonary toilet. Discharged on room air. Referred to Pulmonary to optimize his respiratory status. Today in clinic he had desaturations when walking in and has shortness of breath. Cough occurs at night. Has had to use albuterol and albuterol nebulizer multiple times. He has been compliant with Symbicort and Spiriva however ran out last week.   Social History: Former smoker  1ppd x 40 yrs. Quit in  2011  Environmental exposures:  Chef x 18 years  Past Medical History:  Diagnosis Date   Allergic rhinitis 04/25/2012   Arthritis    Asthma, chronic 10/21/2010   Followed in Pulmonary clinic/ [facility]/ [facility]   - HFA 50% 01/04/2011  > 90% 02/04/2011   - PFT's wnl 02/04/2011  - no intubation before  - has been hospitalized once for asthma in 2012 - quit smoking in 2012 - allergic to aspirin and pollen   10/11/2014>>Repeat pulmonary function tests. Performed for disability application FEV1/FVC=68.5%, FEV1 48%. Consistent with COPD, severe Please see results under media    Chronic pain syndrome 02/03/2011   Involving the shoulder joint  05/20/2013: right shoulder x ray >>Degenerative changes AC joint Seen by PT by 11/2013 >> improvement 03/13/2014 D/c from PT due to orange card being expired and pt cancelling appt    Complication of anesthesia    slow to wake up   COPD (chronic obstructive pulmonary disease) (HCC)    exas 6/13, potentially due to ASA use.    Dyspnea    GERD (gastroesophageal reflux disease)    Hyperlipidemia    Hypertension    Obesity (BMI 30-39.9)    Pneumonia 09/2022   Substance abuse (HCC)    cocaine 35 years ago     Family History  Problem Relation Age of Onset   Pancreatic cancer Mother    Alcohol abuse Father    Sarcoidosis Brother      Social History   Occupational History   Occupation: Unemployed    Comment: worked as a Furniture conservator/restorer  Smoking status: Former    Packs/day: 1.00    Years: 40.00    Additional pack years: 0.00    Total pack years: 40.00    Types: Cigarettes    Quit date: 10/30/2009    Years since quitting: 13.2   Smokeless tobacco: Never  Vaping Use   Vaping Use: Never used  Substance and Sexual Activity   Alcohol use: Not on file    Comment: Beer on wkends   Drug use: No    Comment: former cocaine use 35 years ago   Sexual activity: Not on file    Comment: unemployed, was a Investment banker, operational    Allergies  Allergen Reactions    Aspirin Shortness Of Breath   Nsaids Shortness Of Breath     Outpatient Medications Prior to Visit  Medication Sig Dispense Refill   atorvastatin (LIPITOR) 40 MG tablet Take 1 tablet (40 mg total) by mouth daily. 90 tablet 3   losartan (COZAAR) 100 MG tablet Take 1 tablet (100 mg total) by mouth daily. 90 tablet 3   pregabalin (LYRICA) 100 MG capsule TAKE 1 CAPSULE(100 MG) BY MOUTH THREE TIMES DAILY (Patient taking differently: Take 100 mg by mouth daily as needed (pain). TAKE 1 CAPSULE(100 MG) BY MOUTH THREE TIMES DAILY) 90 capsule 1   tamsulosin (FLOMAX) 0.4 MG CAPS capsule TAKE 1 CAPSULE(0.4 MG) BY MOUTH DAILY 30 capsule 2   VENTOLIN HFA 108 (90 Base) MCG/ACT inhaler INHALE 2 PUFFS INTO THE LUNGS EVERY 6 HOURS AS NEEDED FOR WHEEZING OR SHORTNESS OF BREATH 54 g 2   amLODipine (NORVASC) 10 MG tablet Take 1 tablet (10 mg total) by mouth daily. TAKE 1 TABLET(10 MG) BY MOUTH DAILY Strength: 10 mg 30 tablet 2   fluticasone (FLONASE) 50 MCG/ACT nasal spray Place 1 spray into both nostrils daily. (Patient not taking: Reported on 12/06/2022) 9.9 mL 1   albuterol (PROVENTIL) (2.5 MG/3ML) 0.083% nebulizer solution Take 3 mLs (2.5 mg total) by nebulization every 6 (six) hours as needed for wheezing or shortness of breath. (Patient not taking: Reported on 12/06/2022) 75 mL 12   budesonide-formoterol (SYMBICORT) 160-4.5 MCG/ACT inhaler Inhale 2 puffs into the lungs 2 (two) times daily. 1 each 2   Tiotropium Bromide Monohydrate (SPIRIVA RESPIMAT) 2.5 MCG/ACT AERS Inhale 2 puffs into the lungs daily. 4 g 2   No facility-administered medications prior to visit.    Review of Systems  Constitutional:  Negative for chills, diaphoresis, fever, malaise/fatigue and weight loss.  HENT:  Negative for congestion.   Respiratory:  Positive for cough, shortness of breath and wheezing. Negative for hemoptysis and sputum production.   Cardiovascular:  Negative for chest pain, palpitations and leg swelling.      Objective:   Vitals:   01/11/23 0835  BP: 114/68  Pulse: 89  Temp: 97.8 F (36.6 C)  TempSrc: Oral  SpO2: 91%  Weight: 234 lb 3.2 oz (106.2 kg)  Height: 5\' 11"  (1.803 m)   SpO2: 91 % O2 Device: None (Room air)  Physical Exam: General: Well-appearing, no acute distress HENT: [place], AT Eyes: EOMI, no scleral icterus Respiratory: Diminished air entry with expiratory wheezing Cardiovascular: RRR, -M/R/G, no JVD Extremities:-Edema,-tenderness Neuro: AAO x4, CNII-XII grossly intact Psych: Normal mood, normal affect   Data Reviewed:  Imaging: CTA 09/25/10 - No PE. Bilateral peribronchial thickening, mucoid impaction. Reactive hilar and mediastinal lymphadenopathy. CXR 02/12/20 - Right middle lobe atelectasis CTA 09/16/22 - No PE. Interval worsening of mucoid impaction bilaterally. Unchanged reactive hilar and mediastinal lymphadenopathy  PFT: 02/04/11 FVC 4.11 (80%) FEV1 3.25 (87%) Ratio 80  TLC 83% DLCO 111% Interpretation: Normal PFTs. No significant bronchodilator response  Labs: CBC    Component Value Date/Time   WBC 4.9 12/06/2022 0910   RBC 5.26 12/06/2022 0910   HGB 15.0 12/06/2022 0910   HGB 15.6 02/12/2020 1007   HCT 46.7 12/06/2022 0910   HCT 49.0 02/12/2020 1007   PLT 243 12/06/2022 0910   PLT 246 02/12/2020 1007   MCV 88.8 12/06/2022 0910   MCV 91 02/12/2020 1007   MCH 28.5 12/06/2022 0910   MCHC 32.1 12/06/2022 0910   RDW 15.9 (H) 12/06/2022 0910   RDW 14.2 02/12/2020 1007   LYMPHSABS 3.5 09/16/2022 1915   LYMPHSABS 2.7 02/12/2020 1007   MONOABS 0.8 09/16/2022 1915   EOSABS 0.6 (H) 09/16/2022 1915   EOSABS 0.8 (H) 02/12/2020 1007   BASOSABS 0.0 09/16/2022 1915   BASOSABS 0.0 02/12/2020 1007   Absolute eos  02/12/20 800 09/16/22 600     Assessment & Plan:   Discussion: 62 year old male asthma/COPD, chronic sinusitis with nasal polyps, HTN, HLD, BPH and CBP who presents for follow-up. Recent hospitalization in 09/2022 for COPD exacerbation.  Recently had sinus surgery cancelled on 12/14/22 due to bronchospasm/no O2 needs at that time. Today in clinic had desaturations to the 70s with ambulation and recovered to SpO2 87% at rest on room air. Has not been taking Spiriva and Symbicort for at least one week and requiring multiple nebulizer treatments which he also ran out of recently. Advised patient to go to ED for new oxygen requirement for suspected COPD/asthma exacerbation. Patient agreed and was transported by wheelchair to [facility] ED downstairs from our office   Health Maintenance Immunization History  Administered Date(s) Administered   Influenza Split 04/22/2011, 05/10/2019   Influenza,inj,Quad PF,6+ Mos 05/20/2013, 05/21/2014, 05/14/2018, 08/05/2020, 09/27/2022   PFIZER SARS-COV-2 Pediatric Vaccination 5-70yrs 04/24/2020, 05/15/2020   Pneumococcal Polysaccharide-23 04/22/2011   Tdap 08/05/2020   CT Lung Screen - discuss at next visit. Meets criteria  No orders of the defined types were placed in this encounter.  Meds ordered this encounter  Medications   budesonide-formoterol (SYMBICORT) 160-4.5 MCG/ACT inhaler    Sig: Inhale 2 puffs into the lungs 2 (two) times daily.    Dispense:  1 each    Refill:  5   Tiotropium Bromide Monohydrate (SPIRIVA RESPIMAT) 2.5 MCG/ACT AERS    Sig: Inhale 2 puffs into the lungs daily.    Dispense:  4 g    Refill:  5   albuterol (PROVENTIL) (2.5 MG/3ML) 0.083% nebulizer solution    Sig: Take 3 mLs (2.5 mg total) by nebulization every 6 (six) hours as needed for wheezing or shortness of breath.    Dispense:  360 mL    Refill:  5    No follow-ups on file.   I have spent a total time of 50-minutes on the day of the appointment including chart review, data review, collecting history, coordinating care and discussing medical diagnosis and plan with the patient/family. Past medical history, allergies, medications were reviewed. Pertinent imaging, labs and tests included in this note have  been reviewed and interpreted independently by me.  Buddy Loeffelholz Mechele Collin, MD [facility] 01/11/2023 9:19 AM  Office Number [phone]

## 2023-01-12 DIAGNOSIS — J45901 Unspecified asthma with (acute) exacerbation: Secondary | ICD-10-CM | POA: Diagnosis not present

## 2023-01-12 DIAGNOSIS — Z1152 Encounter for screening for COVID-19: Secondary | ICD-10-CM | POA: Diagnosis not present

## 2023-01-12 DIAGNOSIS — N400 Enlarged prostate without lower urinary tract symptoms: Secondary | ICD-10-CM | POA: Diagnosis not present

## 2023-01-12 DIAGNOSIS — Z800 Family history of malignant neoplasm of digestive organs: Secondary | ICD-10-CM | POA: Diagnosis not present

## 2023-01-12 DIAGNOSIS — I2510 Atherosclerotic heart disease of native coronary artery without angina pectoris: Secondary | ICD-10-CM | POA: Diagnosis not present

## 2023-01-12 DIAGNOSIS — Z811 Family history of alcohol abuse and dependence: Secondary | ICD-10-CM | POA: Diagnosis not present

## 2023-01-12 DIAGNOSIS — Z888 Allergy status to other drugs, medicaments and biological substances status: Secondary | ICD-10-CM | POA: Diagnosis not present

## 2023-01-12 DIAGNOSIS — J324 Chronic pansinusitis: Secondary | ICD-10-CM | POA: Diagnosis not present

## 2023-01-12 DIAGNOSIS — Z886 Allergy status to analgesic agent status: Secondary | ICD-10-CM | POA: Diagnosis not present

## 2023-01-12 DIAGNOSIS — Z6833 Body mass index (BMI) 33.0-33.9, adult: Secondary | ICD-10-CM | POA: Diagnosis not present

## 2023-01-12 DIAGNOSIS — Z7951 Long term (current) use of inhaled steroids: Secondary | ICD-10-CM | POA: Diagnosis not present

## 2023-01-12 DIAGNOSIS — Z79899 Other long term (current) drug therapy: Secondary | ICD-10-CM | POA: Diagnosis not present

## 2023-01-12 DIAGNOSIS — Z9049 Acquired absence of other specified parts of digestive tract: Secondary | ICD-10-CM | POA: Diagnosis not present

## 2023-01-12 DIAGNOSIS — J4551 Severe persistent asthma with (acute) exacerbation: Secondary | ICD-10-CM | POA: Diagnosis not present

## 2023-01-12 DIAGNOSIS — Z87891 Personal history of nicotine dependence: Secondary | ICD-10-CM | POA: Diagnosis not present

## 2023-01-12 DIAGNOSIS — G894 Chronic pain syndrome: Secondary | ICD-10-CM | POA: Diagnosis not present

## 2023-01-12 DIAGNOSIS — J339 Nasal polyp, unspecified: Secondary | ICD-10-CM | POA: Diagnosis not present

## 2023-01-12 DIAGNOSIS — J9601 Acute respiratory failure with hypoxia: Secondary | ICD-10-CM | POA: Diagnosis not present

## 2023-01-12 DIAGNOSIS — J441 Chronic obstructive pulmonary disease with (acute) exacerbation: Secondary | ICD-10-CM | POA: Diagnosis not present

## 2023-01-12 DIAGNOSIS — E669 Obesity, unspecified: Secondary | ICD-10-CM | POA: Diagnosis not present

## 2023-01-12 DIAGNOSIS — K219 Gastro-esophageal reflux disease without esophagitis: Secondary | ICD-10-CM | POA: Diagnosis not present

## 2023-01-12 DIAGNOSIS — I10 Essential (primary) hypertension: Secondary | ICD-10-CM | POA: Diagnosis not present

## 2023-01-12 DIAGNOSIS — E785 Hyperlipidemia, unspecified: Secondary | ICD-10-CM | POA: Diagnosis not present

## 2023-01-12 LAB — CBC
HCT: 43.7 % (ref 39.0–52.0)
Hemoglobin: 13.8 g/dL (ref 13.0–17.0)
MCH: 28.6 pg (ref 26.0–34.0)
MCHC: 31.6 g/dL (ref 30.0–36.0)
MCV: 90.7 fL (ref 80.0–100.0)
Platelets: 261 10*3/uL (ref 150–400)
RBC: 4.82 MIL/uL (ref 4.22–5.81)
RDW: 15.2 % (ref 11.5–15.5)
WBC: 6.5 10*3/uL (ref 4.0–10.5)
nRBC: 0 % (ref 0.0–0.2)

## 2023-01-12 LAB — BASIC METABOLIC PANEL
Anion gap: 9 (ref 5–15)
BUN: 25 mg/dL — ABNORMAL HIGH (ref 8–23)
CO2: 27 mmol/L (ref 22–32)
Calcium: 8.5 mg/dL — ABNORMAL LOW (ref 8.9–10.3)
Chloride: 100 mmol/L (ref 98–111)
Creatinine, Ser: 1.27 mg/dL — ABNORMAL HIGH (ref 0.61–1.24)
GFR, Estimated: >60 mL/min (ref >60)
Glucose, Bld: 150 mg/dL — ABNORMAL HIGH (ref 70–99)
Potassium: 3.9 mmol/L (ref 3.5–5.1)
Sodium: 136 mmol/L (ref 135–145)

## 2023-01-12 MED ORDER — ORAL CARE MOUTH RINSE: 15.0000 mL | OROMUCOSAL | Status: DC | PRN

## 2023-01-12 MED ORDER — METHYLPREDNISOLONE SODIUM SUCC 40 MG IJ SOLR
40.0000 mg | Freq: Two times a day (BID) | INTRAMUSCULAR | Status: DC
  Administered 2023-01-12 – 2023-01-16 (×8): 40 mg via INTRAVENOUS
  Filled 2023-01-12 (×8): qty 1

## 2023-01-12 MED ORDER — AMLODIPINE BESYLATE 5 MG PO TABS
5.0000 mg | ORAL_TABLET | Freq: Every day | ORAL | Status: DC
  Administered 2023-01-13 – 2023-01-16 (×4): 5 mg via ORAL
  Filled 2023-01-12 (×4): qty 1

## 2023-01-12 NOTE — Progress Notes (Signed)
Mobility Specialist - Progress Note  Pre-mobility: 75 bpm HR, 95% SpO2 During mobility: 86 bpm HR, 88% SpO2 Post-mobility: 84 bpm HR, 96% SPO2   01/12/23 1146  Mobility  Activity Ambulated independently in hallway  Level of Assistance Standby assist, set-up cues, supervision of patient - no hands on  Assistive Device None  Distance Ambulated (ft) 700 ft  Range of Motion/Exercises Active  Activity Response Tolerated well  Mobility Referral Yes  $Mobility charge 1 Mobility  Mobility Specialist Start Time (ACUTE ONLY) 1135  Mobility Specialist Stop Time (ACUTE ONLY) 1145  Mobility Specialist Time Calculation (min) (ACUTE ONLY) 10 min   Pt was found in bed and agreeable to ambulate. Had x1 brief standing rest break due to SPO2 dropping to 88%. He was able to bring it up to 94% in <30min. At EOS returned to bed with all needs met. Call bell in reach.  Billey Chang Mobility Specialist

## 2023-01-12 NOTE — TOC Initial Note (Signed)
Transition of Care [facility]) - Initial/Assessment Note    Patient Details  Name: [name] MRN: [id number] Date of Birth: 1960/08/07  [facility] CM/SW Contact:    Howell Rucks, RN Phone Number: 01/12/2023, 10:49 AM  Clinical Narrative:  Met with pt at bedside to introduce role of TOC/NCM and review for dc needs. Pt reports he has PCP and pharmacy in place, no home DME or HH services at this time, has transportation in place when ready for dc. TOC will continue to follow for possible home 02.                  Expected Discharge Plan: Home/Self Care Barriers to Discharge: Continued Medical Work up   Patient Goals and CMS Choice Patient states their goals for this hospitalization and ongoing recovery are:: Home with support from family          Expected Discharge Plan and Services   Discharge Planning Services: CM Consult   Living arrangements for the past 2 months: Single Family Home                                      Prior Living Arrangements/Services Living arrangements for the past 2 months: Single Family Home Lives with:: Self Patient language and need for interpreter reviewed:: Yes Do you feel safe going back to the place where you live?: Yes      Need for Family Participation in Patient Care: Yes (Comment) Care giver support system in place?: Yes (comment)   Criminal Activity/Legal Involvement Pertinent to Current Situation/Hospitalization: No - Comment as needed  Activities of Daily Living Home Assistive Devices/Equipment: None ADL Screening (condition at time of admission) Patient's cognitive ability adequate to safely complete daily activities?: Yes Is the patient deaf or have difficulty hearing?: No Does the patient have difficulty seeing, even when wearing glasses/contacts?: No Does the patient have difficulty concentrating, remembering, or making decisions?: No Patient able to express need for assistance with ADLs?: No Does the  patient have difficulty dressing or bathing?: No Independently performs ADLs?: Yes (appropriate for developmental age) Does the patient have difficulty walking or climbing stairs?: No Weakness of Legs: None Weakness of Arms/Hands: None  Permission Sought/Granted Permission sought to share information with : Case Manager Permission granted to share information with : Yes, Verbal Permission Granted  Share Information with NAME: Fannie Knee, RN           Emotional Assessment Appearance:: Appears stated age Attitude/Demeanor/Rapport: Gracious Affect (typically observed): Accepting Orientation: : Oriented to Self, Oriented to Place, Oriented to  Time, Oriented to Situation Alcohol / Substance Use: Not Applicable Psych Involvement: No (comment)  Admission diagnosis:  Mucoid impaction of bronchi [T17.500A] COPD exacerbation (HCC) [J44.1] Acute respiratory failure with hypoxia (HCC) [J96.01] Patient Active Problem List   Diagnosis Date Noted   Acute respiratory failure with hypoxia (HCC) 01/11/2023   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/2022   Sinusitis with nasal polyps 12/14/2022   Acute exacerbation of COPD with asthma (HCC) 09/17/2022   Hematuria 02/16/2022   Urinary incontinence 12/29/2021   Chronic bilateral low back pain with left-sided sciatica 05/15/2018   Depression 02/22/2016   Foot callus 12/31/2014   Hypercholesteremia 11/19/2014   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 [address] [place], [address] [place] [address] Phone: [phone] Fax: [phone]  [facility] [phone] -  [place], [place] - [address] AT [facility] [address] [facility] [address] [place] [address] Phone: [phone] Fax: [phone]  [facility] [address] [place] [address] Phone: [phone] Fax: [phone]     Social Determinants of Health (SDOH) Social History: SDOH Screenings   Food Insecurity: No Food Insecurity (01/11/2023)  Housing: Low Risk  (01/11/2023)  Transportation Needs: No Transportation Needs (01/11/2023)  Utilities: Not At Risk (01/11/2023)  Alcohol Screen: Low Risk  (09/27/2022)  Depression (PHQ2-9): Low Risk  (09/27/2022)  Financial Resource Strain: Low Risk  (09/27/2022)  Physical Activity: Insufficiently Active (09/27/2022)  Social Connections: Socially Isolated (09/27/2022)  Stress: No Stress Concern Present (09/27/2022)  Tobacco Use: Medium Risk (01/11/2023)   SDOH Interventions:     Readmission Risk Interventions     No data to display

## 2023-01-12 NOTE — Progress Notes (Signed)
PROGRESS NOTE    [name]  ZOX:[id number] DOB: 1961/04/20 DOA: 01/11/2023 PCP: Belva Agee, MD   Brief Narrative:  and COPD/asthma who presents from his pulmonology clinic with shortness of breath and acute hypoxic respiratory failure.   He reports worsening SOB over the past month but particularly over the past 1-2 days. There is wheezing and productive cough associated with this but he denies, chest pain, leg swelling, or fever.     [facility] ED Course: Upon arrival to the ED, patient is found to be afebrile and saturating low 80s on room air with normal heart rate and stable blood pressure.  EKG demonstrates sinus rhythm and chest CT reveals persistent mucoid impaction that is similar to the prior study.  Labs are notable for creatinine 1.27, normal WBC, normal BNP, normal D-dimer, normal troponin, and negative COVID-19 PCR.  Assessment & Plan:   Principal Problem:   Acute respiratory failure with hypoxia (HCC) Active Problems:   Essential hypertension, benign   Acute exacerbation of COPD with asthma (HCC)    Acute hypoxic respiratory failure secondary to asthma COPD exacerbation.  Patient was 87% on room air at the pulmonary office prior to coming into the ER.  Patient was not on oxygen prior to admission to the hospital at home.  He reports he feels better than yesterday but not back to his baseline.  Continue Solu-Medrol DuoNebs nebulizers O2 titrate as tolerated Continue antibiotics. Will check ambulatory oxygen saturation prior to discharge VBG 7 point 3/62/26 He is still requiring 4 L of oxygen to maintain his sats above 91% He has bilateral wheezes on exam.  History of hyperlipidemia on Lipitor  History of hypertension on Norvasc and losartan blood pressure 124/71.  Also on Flomax.  Will decrease Norvasc to 5 mg a day.  BPH continue Flomax  Estimated body mass index is 33.05 kg/m as calculated from the following:   Height as of this encounter: 5'  11" (1.803 m).   Weight as of this encounter: 107.5 kg.  DVT prophylaxis: Lovenox  code Status: Full code Family Communication: None at bedside  disposition Plan:  Status is: Inpatient Remains inpatient appropriate because: Acute hypoxia new onset   Consultants:  PCCM  Procedures: None Antimicrobials: Anti-infectives (From admission, onward)    Start     Dose/Rate Route Frequency Ordered Stop   01/11/23 2015  cefTRIAXone (ROCEPHIN) 2 g in sodium chloride 0.9 % 100 mL IVPB        2 g 200 mL/hr over 30 Minutes Intravenous Every 24 hours 01/11/23 1928 01/16/23 2014       Subjective: He reports he is better than yesterday but still not back to his baseline still requiring 4 L of O2  Objective: Vitals:   01/12/23 0625 01/12/23 0821 01/12/23 1111 01/12/23 1331  BP:   124/71   Pulse:   70   Resp:   18   Temp:   97.8 F (36.6 C)   TempSrc:   Oral   SpO2:  93% 94% 91%  Weight: 107.5 kg     Height: 5\' 11"  (1.803 m)       Intake/Output Summary (Last 24 hours) at 01/12/2023 1416 Last data filed at 01/12/2023 1334 Gross per 24 hour  Intake 940.11 ml  Output 550 ml  Net 390.11 ml   Filed Weights   01/12/23 0625  Weight: 107.5 kg    Examination:  General exam: Appears calm and comfortable  Respiratory system-wheezing bilaterally to auscultation. Respiratory effort  normal. Cardiovascular system: S1 & S2 heard, RRR. No JVD, murmurs, rubs, gallops or clicks. No pedal edema. Gastrointestinal system: Abdomen is nondistended, soft and nontender. No organomegaly or masses felt. Normal bowel sounds heard. Central nervous system: Alert and oriented. No focal neurological deficits. Extremities: No edema Skin: No rashes, lesions or ulcers Psychiatry: Judgement and insight appear normal. Mood & affect appropriate.     Data Reviewed: I have personally reviewed following labs and imaging studies  CBC: Recent Labs  Lab 01/11/23 0938 01/11/23 1037 01/12/23 0515  WBC 6.5  --   6.5  NEUTROABS 3.1  --   --   HGB 15.3 17.0 13.8  HCT 48.4 50.0 43.7  MCV 90.0  --  90.7  PLT 286  --  261   Basic Metabolic Panel: Recent Labs  Lab 01/11/23 0938 01/11/23 1037 01/12/23 0515  NA 141 142 136  K 3.8 3.6 3.9  CL 102  --  100  CO2 31  --  27  GLUCOSE 102*  --  150*  BUN 18  --  25*  CREATININE 1.27*  --  1.27*  CALCIUM 9.6  --  8.5*  MG 1.9  --   --   PHOS 3.2  --   --    GFR: Estimated Creatinine Clearance: 74.3 mL/min (A) (by C-G formula based on SCr of 1.27 mg/dL (H)). Liver Function Tests: No results for input(s): "AST", "ALT", "ALKPHOS", "BILITOT", "PROT", "ALBUMIN" in the last 168 hours. No results for input(s): "LIPASE", "AMYLASE" in the last 168 hours. No results for input(s): "AMMONIA" in the last 168 hours. Coagulation Profile: No results for input(s): "INR", "PROTIME" in the last 168 hours. Cardiac Enzymes: No results for input(s): "CKTOTAL", "CKMB", "CKMBINDEX", "TROPONINI" in the last 168 hours. BNP (last 3 results) No results for input(s): "PROBNP" in the last 8760 hours. HbA1C: No results for input(s): "HGBA1C" in the last 72 hours. CBG: No results for input(s): "GLUCAP" in the last 168 hours. Lipid Profile: No results for input(s): "CHOL", "HDL", "LDLCALC", "TRIG", "CHOLHDL", "LDLDIRECT" in the last 72 hours. Thyroid Function Tests: No results for input(s): "TSH", "T4TOTAL", "FREET4", "T3FREE", "THYROIDAB" in the last 72 hours. Anemia Panel: No results for input(s): "VITAMINB12", "FOLATE", "FERRITIN", "TIBC", "IRON", "RETICCTPCT" in the last 72 hours. Sepsis Labs: No results for input(s): "PROCALCITON", "LATICACIDVEN" in the last 168 hours.  Recent Results (from the past 240 hour(s))  SARS Coronavirus 2 by RT PCR (hospital order, performed in [facility] hospital lab) *cepheid single result test* Anterior Nasal Swab     Status: None   Collection Time: 01/11/23  9:39 AM   Specimen: Anterior Nasal Swab  Result Value Ref Range Status    SARS Coronavirus 2 by RT PCR NEGATIVE NEGATIVE Final    Comment: (NOTE) SARS-CoV-2 target nucleic acids are NOT DETECTED.  The SARS-CoV-2 RNA is generally detectable in upper and lower respiratory specimens during the acute phase of infection. The lowest concentration of SARS-CoV-2 viral copies this assay can detect is 250 copies / mL. A negative result does not preclude SARS-CoV-2 infection and should not be used as the sole basis for treatment or other patient management decisions.  A negative result may occur with improper specimen collection / handling, submission of specimen other than nasopharyngeal swab, presence of viral mutation(s) within the areas targeted by this assay, and inadequate number of viral copies (<250 copies / mL). A negative result must be combined with clinical observations, patient history, and epidemiological information.  Fact Sheet for  Patients:   RoadLapTop.co.za  Fact Sheet for Healthcare Providers: http://kim-miller.com/  This test is not yet approved or  cleared by the [place] FDA and has been authorized for detection and/or diagnosis of SARS-CoV-2 by FDA under an Emergency Use Authorization (EUA).  This EUA will remain in effect (meaning this test can be used) for the duration of the COVID-19 declaration under Section 564(b)(1) of the Act, 21 U.S.C. section 360bbb-3(b)(1), unless the authorization is terminated or revoked sooner.  Performed at [facility], [address] [place], [place] [address]          Radiology Studies: CT Chest Wo Contrast  Result Date: 01/11/2023 CLINICAL DATA:  Dyspnea, chronic, unclear etiology mucoid impaction EXAM: CT CHEST WITHOUT CONTRAST TECHNIQUE: Multidetector CT imaging of the chest was performed following the standard protocol without IV contrast. RADIATION DOSE REDUCTION: This exam was performed according to the departmental  dose-optimization program which includes automated exposure control, adjustment of the mA and/or kV according to patient size and/or use of iterative reconstruction technique. COMPARISON:  CT 07/17/2023 FINDINGS: Cardiovascular: Heart size within normal limits. No pericardial effusion. Thoracic aorta is nonaneurysmal. Scattered atherosclerotic vascular calcifications of the aorta and coronary arteries. Central pulmonary vasculature within normal limits. Mediastinum/Nodes: Mildly enlarged pretracheal and lower right paratracheal lymph nodes measuring 10 mm short axis (series 2, images 49 and 68). These are stable in size compared to the prior CT. No axillary lymphadenopathy. Evaluation of the hilar structures is limited in the absence of intravenous contrast. Within this limitation, no obvious hilar adenopathy or mass is identified. Thyroid gland and esophagus within normal limits. Small amount of secretions within the trachea. Lungs/Pleura: Persistent findings of mucoid impaction, most pronounced within the medial aspect of the right lower lobe with less prominent involvement in the left lower lobe and right middle lobe. Appearance is similar in degree compared to the previous study. Lungs are otherwise clear. No pleural effusion or pneumothorax. Upper Abdomen: No acute abnormality. Musculoskeletal: No chest wall mass or suspicious bone lesions identified. 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 IMPRESSION: 1. Persistent findings of mucoid impaction, most pronounced within the medial aspect of the right lower lobe with less prominent involvement in the left lower lobe and right middle lobe. Appearance is similar in degree compared to the previous study. 2. Stable mildly enlarged pretracheal and lower right paratracheal lymph nodes, likely reactive. 3. Aortic and coronary artery atherosclerosis (ICD10-I70.0). Electronically Signed   By: Duanne Guess D.O.   On:  01/11/2023 13:12   DG Chest 2 View  Result Date: 01/11/2023 CLINICAL DATA:  Cough and shortness of breath EXAM: CHEST - 2 VIEW COMPARISON:  Chest radiograph dated 09/16/2022, CTA chest dated 09/16/2022 FINDINGS: Normal lung volumes. Confluent right basilar opacity likely corresponding to previously noted mucoid impaction. No pleural effusion or pneumothorax. The heart size and mediastinal contours are within normal limits. No acute osseous abnormality. IMPRESSION: Confluent right basilar opacity likely corresponding to previously noted mucoid impaction. Electronically Signed   By: Agustin Cree M.D.   On: 01/11/2023 10:35        Scheduled Meds:  amLODipine  10 mg Oral Daily   atorvastatin  40 mg Oral Daily   enoxaparin (LOVENOX) injection  40 mg Subcutaneous QHS   ipratropium-albuterol  3 mL Nebulization Q6H   losartan  100 mg Oral Daily   [START ON 01/13/2023] predniSONE  40 mg Oral Q breakfast   sodium chloride flush  3 mL Intravenous  Q12H   tamsulosin  0.4 mg Oral Daily   Continuous Infusions:  cefTRIAXone (ROCEPHIN)  IV 2 g (01/11/23 2202)     LOS: 0 days    Time spent: 39 min  Alwyn Ren, MD  01/12/2023, 2:16 PM

## 2023-01-13 DIAGNOSIS — J4551 Severe persistent asthma with (acute) exacerbation: Secondary | ICD-10-CM | POA: Diagnosis not present

## 2023-01-13 DIAGNOSIS — J9601 Acute respiratory failure with hypoxia: Secondary | ICD-10-CM | POA: Diagnosis not present

## 2023-01-13 DIAGNOSIS — J324 Chronic pansinusitis: Secondary | ICD-10-CM | POA: Diagnosis not present

## 2023-01-13 DIAGNOSIS — J339 Nasal polyp, unspecified: Secondary | ICD-10-CM

## 2023-01-13 LAB — CBC
HCT: 43.9 % (ref 39.0–52.0)
Hemoglobin: 13.7 g/dL (ref 13.0–17.0)
MCH: 28.5 pg (ref 26.0–34.0)
MCHC: 31.2 g/dL (ref 30.0–36.0)
MCV: 91.5 fL (ref 80.0–100.0)
Platelets: 263 10*3/uL (ref 150–400)
RBC: 4.8 MIL/uL (ref 4.22–5.81)
RDW: 15.5 % (ref 11.5–15.5)
WBC: 12.1 10*3/uL — ABNORMAL HIGH (ref 4.0–10.5)
nRBC: 0 % (ref 0.0–0.2)

## 2023-01-13 LAB — BASIC METABOLIC PANEL
Anion gap: 10 (ref 5–15)
BUN: 29 mg/dL — ABNORMAL HIGH (ref 8–23)
CO2: 26 mmol/L (ref 22–32)
Calcium: 8.4 mg/dL — ABNORMAL LOW (ref 8.9–10.3)
Chloride: 101 mmol/L (ref 98–111)
Creatinine, Ser: 1.27 mg/dL — ABNORMAL HIGH (ref 0.61–1.24)
GFR, Estimated: >60 mL/min (ref >60)
Glucose, Bld: 154 mg/dL — ABNORMAL HIGH (ref 70–99)
Potassium: 4.1 mmol/L (ref 3.5–5.1)
Sodium: 137 mmol/L (ref 135–145)

## 2023-01-13 MED ORDER — ALUM & MAG HYDROXIDE-SIMETH 200-200-20 MG/5ML PO SUSP
30.0000 mL | ORAL | Status: DC | PRN
  Administered 2023-01-13 – 2023-01-15 (×3): 30 mL via ORAL
  Filled 2023-01-13 (×3): qty 30

## 2023-01-13 MED ORDER — BUDESONIDE 0.5 MG/2ML IN SUSP
0.5000 mg | Freq: Two times a day (BID) | RESPIRATORY_TRACT | Status: DC
  Administered 2023-01-13 – 2023-01-16 (×6): 0.5 mg via RESPIRATORY_TRACT
  Filled 2023-01-13 (×7): qty 2

## 2023-01-13 MED ORDER — GUAIFENESIN ER 600 MG PO TB12
1200.0000 mg | ORAL_TABLET | Freq: Two times a day (BID) | ORAL | Status: DC
  Administered 2023-01-13 – 2023-01-16 (×7): 1200 mg via ORAL
  Filled 2023-01-13 (×7): qty 2

## 2023-01-13 MED ORDER — REVEFENACIN 175 MCG/3ML IN SOLN
175.0000 ug | Freq: Every day | RESPIRATORY_TRACT | Status: DC
  Administered 2023-01-13 – 2023-01-16 (×4): 175 ug via RESPIRATORY_TRACT
  Filled 2023-01-13 (×4): qty 3

## 2023-01-13 MED ORDER — ALBUTEROL SULFATE (2.5 MG/3ML) 0.083% IN NEBU
2.5000 mg | INHALATION_SOLUTION | RESPIRATORY_TRACT | Status: DC | PRN
  Administered 2023-01-13 – 2023-01-15 (×3): 2.5 mg via RESPIRATORY_TRACT
  Filled 2023-01-13 (×3): qty 3

## 2023-01-13 MED ORDER — AZELASTINE HCL 0.1 % NA SOLN
1.0000 | Freq: Two times a day (BID) | NASAL | Status: DC
  Administered 2023-01-13 – 2023-01-16 (×7): 1 via NASAL
  Filled 2023-01-13: qty 30

## 2023-01-13 MED ORDER — ARFORMOTEROL TARTRATE 15 MCG/2ML IN NEBU
15.0000 ug | INHALATION_SOLUTION | Freq: Two times a day (BID) | RESPIRATORY_TRACT | Status: DC
  Administered 2023-01-13 – 2023-01-16 (×6): 15 ug via RESPIRATORY_TRACT
  Filled 2023-01-13 (×6): qty 2

## 2023-01-13 MED ORDER — FLUTICASONE PROPIONATE 50 MCG/ACT NA SUSP
1.0000 | Freq: Two times a day (BID) | NASAL | Status: DC
  Administered 2023-01-13 – 2023-01-16 (×7): 1 via NASAL
  Filled 2023-01-13: qty 16

## 2023-01-13 MED ORDER — LORATADINE 10 MG PO TABS
10.0000 mg | ORAL_TABLET | Freq: Every day | ORAL | Status: DC
  Administered 2023-01-13 – 2023-01-16 (×4): 10 mg via ORAL
  Filled 2023-01-13 (×4): qty 1

## 2023-01-13 MED ORDER — SALINE SPRAY 0.65 % NA SOLN
2.0000 | Freq: Two times a day (BID) | NASAL | Status: DC
  Administered 2023-01-13 – 2023-01-16 (×7): 2 via NASAL
  Filled 2023-01-13: qty 44

## 2023-01-13 MED ORDER — MONTELUKAST SODIUM 10 MG PO TABS
10.0000 mg | ORAL_TABLET | Freq: Every day | ORAL | Status: DC
  Administered 2023-01-13 – 2023-01-15 (×3): 10 mg via ORAL
  Filled 2023-01-13 (×3): qty 1

## 2023-01-13 NOTE — Progress Notes (Signed)
PROGRESS NOTE    [name]  ZOX:[id number] DOB: May 19, 1961 DOA: 01/11/2023 PCP: Belva Agee, MD   Brief Narrative: 62 year old male with history of COPD/asthma who presents from his pulmonology clinic with shortness of breath and acute hypoxic respiratory failure He reports worsening SOB over the past month but particularly over the past 1-2 days. There is wheezing and productive cough associated with this but he denies, chest pain, leg swelling, or fever.     [facility] ED Course: Upon arrival to the ED, patient is found to be afebrile and saturating low 80s on room air with normal heart rate and stable blood pressure.  EKG demonstrates sinus rhythm and chest CT reveals persistent mucoid impaction that is similar to the prior study.  Labs are notable for creatinine 1.27, normal WBC, normal BNP, normal D-dimer, normal troponin, and negative COVID-19 PCR.  Assessment & Plan:   Principal Problem:   Acute respiratory failure with hypoxia (HCC) Active Problems:   Essential hypertension, benign   Acute exacerbation of COPD with asthma (HCC)    Acute hypoxic respiratory failure secondary to asthma COPD exacerbation.  Patient was 87% on room air at the pulmonary office prior to coming into the ER.  Patient was not on oxygen prior to admission to the hospital at home.  He still remains on 3 L of oxygen with a saturation of 94%.   He thinks his breathing gets worse as the weather gets hot  he reports he feels better than yesterday but not back to his baseline.  Continue Solu-Medrol DuoNebs nebulizers O2 titrate as tolerated Continue antibiotics. Will check ambulatory oxygen saturation prior to discharge VBG 7.3/62/26 Appreciate pulmonary input Discussed with [facility] ENT patient with history of nasal polyps, they are hoping to optimize pulmonary status prior to surgery   History of hyperlipidemia on Lipitor  History of hypertension on Norvasc and losartan blood pressure  124/71.  Also on Flomax.  Will decrease Norvasc to 5 mg a day.  BPH continue Flomax  Estimated body mass index is 33.05 kg/m as calculated from the following:   Height as of this encounter: 5\' 11"  (1.803 m).   Weight as of this encounter: 107.5 kg.  DVT prophylaxis: Lovenox  code Status: Full code Family Communication: None at bedside  disposition Plan:  Status is: Inpatient Remains inpatient appropriate because: Acute hypoxia new onset   Consultants:  PCCM  Procedures: None Antimicrobials: Anti-infectives (From admission, onward)    Start     Dose/Rate Route Frequency Ordered Stop   01/11/23 2015  cefTRIAXone (ROCEPHIN) 2 g in sodium chloride 0.9 % 100 mL IVPB        2 g 200 mL/hr over 30 Minutes Intravenous Every 24 hours 01/11/23 1928 01/16/23 2014       Subjective:  Continues with some cough and wheezing Feels his breathing is worse as the weather gets hot  Objective: Vitals:   01/13/23 0715 01/13/23 0813 01/13/23 1022 01/13/23 1324  BP:   128/65 (!) 149/75  Pulse:   73 91  Resp:   20 20  Temp:    98 F (36.7 C)  TempSrc:    Oral  SpO2: 92% 93% 95% 94%  Weight:      Height:        Intake/Output Summary (Last 24 hours) at 01/13/2023 1404 Last data filed at 01/13/2023 1300 Gross per 24 hour  Intake 1081 ml  Output 900 ml  Net 181 ml    [employer]  01/12/23 0625  Weight: 107.5 kg    Examination:  General exam: Appears calm and comfortable  Respiratory system-wheezing bilaterally to auscultation. Respiratory effort normal. Cardiovascular system: S1 & S2 heard, RRR. No JVD, murmurs, rubs, gallops or clicks. No pedal edema. Gastrointestinal system: Abdomen is nondistended, soft and nontender. No organomegaly or masses felt. Normal bowel sounds heard. Central nervous system: Alert and oriented. No focal neurological deficits. Extremities: No edema Skin: No rashes, lesions or ulcers Psychiatry: Judgement and insight appear normal. Mood & affect  appropriate.     Data Reviewed: I have personally reviewed following labs and imaging studies  CBC: Recent Labs  Lab 01/11/23 0938 01/11/23 1037 01/12/23 0515 01/13/23 0459  WBC 6.5  --  6.5 12.1*  NEUTROABS 3.1  --   --   --   HGB 15.3 17.0 13.8 13.7  HCT 48.4 50.0 43.7 43.9  MCV 90.0  --  90.7 91.5  PLT 286  --  261 263    Basic Metabolic Panel: Recent Labs  Lab 01/11/23 0938 01/11/23 1037 01/12/23 0515 01/13/23 0459  NA 141 142 136 137  K 3.8 3.6 3.9 4.1  CL 102  --  100 101  CO2 31  --  27 26  GLUCOSE 102*  --  150* 154*  BUN 18  --  25* 29*  CREATININE 1.27*  --  1.27* 1.27*  CALCIUM 9.6  --  8.5* 8.4*  MG 1.9  --   --   --   PHOS 3.2  --   --   --     GFR: Estimated Creatinine Clearance: 74.3 mL/min (A) (by C-G formula based on SCr of 1.27 mg/dL (H)). Liver Function Tests: No results for input(s): "AST", "ALT", "ALKPHOS", "BILITOT", "PROT", "ALBUMIN" in the last 168 hours. No results for input(s): "LIPASE", "AMYLASE" in the last 168 hours. No results for input(s): "AMMONIA" in the last 168 hours. Coagulation Profile: No results for input(s): "INR", "PROTIME" in the last 168 hours. Cardiac Enzymes: No results for input(s): "CKTOTAL", "CKMB", "CKMBINDEX", "TROPONINI" in the last 168 hours. BNP (last 3 results) No results for input(s): "PROBNP" in the last 8760 hours. HbA1C: No results for input(s): "HGBA1C" in the last 72 hours. CBG: No results for input(s): "GLUCAP" in the last 168 hours. Lipid Profile: No results for input(s): "CHOL", "HDL", "LDLCALC", "TRIG", "CHOLHDL", "LDLDIRECT" in the last 72 hours. Thyroid Function Tests: No results for input(s): "TSH", "T4TOTAL", "FREET4", "T3FREE", "THYROIDAB" in the last 72 hours. Anemia Panel: No results for input(s): "VITAMINB12", "FOLATE", "FERRITIN", "TIBC", "IRON", "RETICCTPCT" in the last 72 hours. Sepsis Labs: No results for input(s): "PROCALCITON", "LATICACIDVEN" in the last 168 hours.  Recent  Results (from the past 240 hour(s))  SARS Coronavirus 2 by RT PCR (hospital order, performed in [facility] hospital lab) *cepheid single result test* Anterior Nasal Swab     Status: None   Collection Time: 01/11/23  9:39 AM   Specimen: Anterior Nasal Swab  Result Value Ref Range Status   SARS Coronavirus 2 by RT PCR NEGATIVE NEGATIVE Final    Comment: (NOTE) SARS-CoV-2 target nucleic acids are NOT DETECTED.  The SARS-CoV-2 RNA is generally detectable in upper and lower respiratory specimens during the acute phase of infection. The lowest concentration of SARS-CoV-2 viral copies this assay can detect is 250 copies / mL. A negative result does not preclude SARS-CoV-2 infection and should not be used as the sole basis for treatment or other patient management decisions.  A negative result may  occur with improper specimen collection / handling, submission of specimen other than nasopharyngeal swab, presence of viral mutation(s) within the areas targeted by this assay, and inadequate number of viral copies (<250 copies / mL). A negative result must be combined with clinical observations, patient history, and epidemiological information.  Fact Sheet for Patients:   RoadLapTop.co.za  Fact Sheet for Healthcare Providers: http://kim-miller.com/  This test is not yet approved or  cleared by the [place] FDA and has been authorized for detection and/or diagnosis of SARS-CoV-2 by FDA under an Emergency Use Authorization (EUA).  This EUA will remain in effect (meaning this test can be used) for the duration of the COVID-19 declaration under Section 564(b)(1) of the Act, 21 U.S.C. section 360bbb-3(b)(1), unless the authorization is terminated or revoked sooner.  Performed at [facility], [address], [place], [place] [address]          Radiology Studies: No results found.      Scheduled Meds:  amLODipine   5 mg Oral Daily   arformoterol  15 mcg Nebulization BID   atorvastatin  40 mg Oral Daily   azelastine  1 spray Each Nare BID   budesonide (PULMICORT) nebulizer solution  0.5 mg Nebulization BID   enoxaparin (LOVENOX) injection  40 mg Subcutaneous QHS   fluticasone  1 spray Each Nare BID   guaiFENesin  1,200 mg Oral BID   loratadine  10 mg Oral Daily   losartan  100 mg Oral Daily   methylPREDNISolone (SOLU-MEDROL) injection  40 mg Intravenous Q12H   montelukast  10 mg Oral QHS   revefenacin  175 mcg Nebulization Daily   sodium chloride  2 spray Each Nare BID   sodium chloride flush  3 mL Intravenous Q12H   tamsulosin  0.4 mg Oral Daily   Continuous Infusions:  cefTRIAXone (ROCEPHIN)  IV 2 g (01/12/23 2131)     LOS: 1 day    Time spent: 39 min  Alwyn Ren, MD  01/13/2023, 2:04 PM

## 2023-01-13 NOTE — Consult Note (Signed)
NAME:  MANDIP FRAME, MRN:  [id number], DOB:  04-Feb-1961, LOS: 1 ADMISSION DATE:  01/11/2023 CONSULTATION DATE:  01/13/2023 REFERRING MD:  Jerolyn Center - [facility] CHIEF COMPLAINT:  Asthma/COPD exacerbation   History of Present Illness:  Mr. Noska is seen in consultation at the request of Dr. Jerolyn Center [facility]) for recommendations on further management of asthma/COPD exacerbation.  62 year old man with PMHx significant for HTN, HLD, asthma/COPD (with allergic component, concern for aspirin-exacerbated respiratory disease), chronic sinusitis/allergic rhinitis with nasal polyposis, GERD, prior tobacco use, chronic pain syndrome, OA.  Recently seen by ENT for sinus surgery to address chronic pansinusitis and nasal polyposis; surgery was planned for 5/15 but aborted in the setting of difficulty ventilating post-induction. He was extubated in the OR without complication and maintained SpO2 on RA; subsequently discharged home. Referred to Pulmonary for optimization of respiratory status prior to reattempt at sinus surgery.  Presented to pulmonary clinic 6/12 after being lost to follow-up for two years (asthma/COPD managed by [facility] in the interim). Previously on Breo/Incruse, transitioned to Spiriva/Symbicort for cost purposes. Noted to have SOB with associated desaturation to 70s with ambulation; reported running out of home nebulizer medications. He was advised to present to [facility] ED for further evaluation of new O2 requirement and presumed asthma/COPD exacerbation with increased productive cough. Had significant wheezing on ED presentation. CXR with R basilar opacity +/- mucoid impaction. Received DuoNebs, IV Solumedrol and Mg. CT Chest 6/12 showed persistent mucoid impaction (medial RLL, less LLL/RML) and stably mildly enlarge pretracheal/lower R paratracheal LN (reactive). Patient was admitted to [facility].  PCCM consulted 6/14 for persistent O2 requirement and wheezing. Patient reports SOB/DOE x 8 years, denies CP at  present. Noted worsening symptoms after cleaning out his mother's home, was concerned for dust/mold exposure. Reports cough productive of white-yellow sputum and more yellow-green nasal discharge. He denies pets in the home or significant animal/bird/occupational exposures (works as a Investment banker, operational). He has taken multiple inhalers and nasal sprays in the past without significant relief. Denies fevers at home, chills, swelling, dizziness/HA. Denies rashes, eczema.  Pertinent Medical History:   Past Medical History:  Diagnosis Date   Allergic rhinitis 04/25/2012   Arthritis    Asthma, chronic 10/21/2010   Followed in Pulmonary clinic/ [facility] [place]   - HFA 50% 01/04/2011  > 90% 02/04/2011   - PFT's wnl 02/04/2011  - no intubation before  - has been hospitalized once for asthma in 2012 - quit smoking in 2012 - allergic to aspirin and pollen   10/11/2014>>Repeat pulmonary function tests. Performed for disability application FEV1/FVC=68.5%, FEV1 48%. Consistent with COPD, severe Please see results under media    Chronic pain syndrome 02/03/2011   Involving the shoulder joint  05/20/2013: right shoulder x ray >>Degenerative changes AC joint Seen by PT by 11/2013 >> improvement 03/13/2014 D/c from PT due to orange card being expired and pt cancelling appt    Complication of anesthesia    slow to wake up   COPD (chronic obstructive pulmonary disease) (HCC)    exas 6/13, potentially due to ASA use.    Dyspnea    GERD (gastroesophageal reflux disease)    Hyperlipidemia    Hypertension    Obesity (BMI 30-39.9)    Pneumonia 09/2022   Substance abuse (HCC)    cocaine 35 years ago   Significant Hospital Events: Including procedures, antibiotic start and stop dates in addition to other pertinent events   6/12 - Presented to [facility] clinic for follow-up. Desat to 53s  with ambulation, 80s on RA. Sent to [facility] ED. CXR/CT Chest completed with mucoid impactions. Given new O2 requirement, admitted to [facility] 6/14 - PCCM consulted for assistance with AECOPD/asthma exacerbation.  Interim History / Subjective:  PCCM consulted for assistance with AECOPD/asthma exacerbation. Not feeling well, audibly wheezing on exam Some conversational dyspnea Coughing spells needing O2 to recover  Objective:  Blood pressure 128/65, pulse 73, temperature 97.9 F (36.6 C), temperature source Oral, resp. rate 20, height 5\' 11"  (1.803 m), weight 107.5 kg, SpO2 95 %.    FiO2 (%):  [36 %] 36 %   Intake/Output Summary (Last 24 hours) at 01/13/2023 1310 Last data filed at 01/13/2023 1025 Gross per 24 hour  Intake 1201 ml  Output 1150 ml  Net 51 ml   Filed Weights   01/12/23 0625  Weight: 107.5 kg   Physical Examination: General: Acutely ill-appearing middle-aged man in NAD. Flattened affect. HEENT: [place]/AT, anicteric sclera, moist mucous membranes. Neuro: Awake, oriented x 4. Responds to verbal stimuli. Following commands consistently. Moves all 4 extremities spontaneously. CV: RRR, no m/g/r. PULM: Breathing even and mildly labored on RA (3L O2 Venti mask placed during exam). +Audible wheezing. Lung fields with diffuse wheezing, loud upper airway sounds. GI: Soft, nontender, nondistended. Extremities: No LE edema noted. Skin: Warm/dry, no rashes.  Resolved Hospital Problem List:    Assessment & Plan:  Mr. Feiock is seen in consultation at the request of Dr. Jerolyn Center [facility]) for recommendations on further management of asthma/COPD exacerbation.  62 year old man with asthma/COPD (with allergic component, concern for aspirin-exacerbated respiratory disease), chronic sinusitis/allergic rhinitis with nasal polyposis. Unfortunately attempted to be addressed operatively with ENT 11/2022, but procedure aborted due to difficulty ventilating patient post-induction. Concern for aspirin-exacerbated respiratory disease.  Asthma, presumed aspirin-exacerbated respiratory disease COPD with acute exacerbation Chronic  sinusitis/allergic rhinitis Nasal polyposis - Bronchodilators as ordered (Brovana/Yupelri, Pulmicort) + albuterol PRN - Continue steroids - Add Singulair - Nasal sprays (Flonase, azelastine) - Would likely be an excellent Dupixent candidate, can set up as outpatient - Pulmonary hygiene - Supplemental O2 support to maintain goal sat 88-92% - Hospitalist d/w ENT, no indication for in-hospital intervention, patient may follow up as an outpatient  Best Practice: (right click and "Reselect all SmartList Selections" daily)   Per Primary Team  Labs:  CBC: Recent Labs  Lab 01/11/23 0938 01/11/23 1037 01/12/23 0515 01/13/23 0459  WBC 6.5  --  6.5 12.1*  NEUTROABS 3.1  --   --   --   HGB 15.3 17.0 13.8 13.7  HCT 48.4 50.0 43.7 43.9  MCV 90.0  --  90.7 91.5  PLT 286  --  261 263   Basic Metabolic Panel: Recent Labs  Lab 01/11/23 0938 01/11/23 1037 01/12/23 0515 01/13/23 0459  NA 141 142 136 137  K 3.8 3.6 3.9 4.1  CL 102  --  100 101  CO2 31  --  27 26  GLUCOSE 102*  --  150* 154*  BUN 18  --  25* 29*  CREATININE 1.27*  --  1.27* 1.27*  CALCIUM 9.6  --  8.5* 8.4*  MG 1.9  --   --   --   PHOS 3.2  --   --   --    GFR: Estimated Creatinine Clearance: 74.3 mL/min (A) (by C-G formula based on SCr of 1.27 mg/dL (H)). Recent Labs  Lab 01/11/23 0938 01/12/23 0515 01/13/23 0459  WBC 6.5 6.5 12.1*   Liver Function Tests: No  results for input(s): "AST", "ALT", "ALKPHOS", "BILITOT", "PROT", "ALBUMIN" in the last 168 hours. No results for input(s): "LIPASE", "AMYLASE" in the last 168 hours. No results for input(s): "AMMONIA" in the last 168 hours.  ABG:    Component Value Date/Time   PHART 7.332 (L) 01/11/2011 2336   PCO2ART 42.3 01/11/2011 2336   PO2ART 97.0 01/11/2011 2336   HCO3 33.6 (H) 01/11/2023 1037   TCO2 35 (H) 01/11/2023 1037   ACIDBASEDEF 4.0 (H) 01/11/2011 2336   O2SAT 43 01/11/2023 1037    Coagulation Profile: No results for input(s): "INR", "PROTIME"  in the last 168 hours.  Cardiac Enzymes: No results for input(s): "CKTOTAL", "CKMB", "CKMBINDEX", "TROPONINI" in the last 168 hours.  HbA1C: Hemoglobin A1C  Date/Time Value Ref Range Status  12/29/2010 12:00 AM 5.8  Final   Hgb A1c MFr Bld  Date/Time Value Ref Range Status  01/13/2011 09:12 AM 6.1 (H) <5.7 % Final    Comment:    (NOTE)                                                                       According to the ADA Clinical Practice Recommendations for 2011, when HbA1c is used as a screening test:  >=6.5%   Diagnostic of Diabetes Mellitus           (if abnormal result is confirmed) 5.7-6.4%   Increased risk of developing Diabetes Mellitus References:Diagnosis and Classification of Diabetes Mellitus,Diabetes Care,2011,34(Suppl 1):S62-S69 and Standards of Medical Care in         Diabetes - 2011,Diabetes Care,2011,34 (Suppl 1):S11-S61.   CBG: No results for input(s): "GLUCAP" in the last 168 hours.  Review of Systems:   Review of systems completed with pertinent positives/negatives outlined in above HPI.  Past Medical History:  He,  has a past medical history of Allergic rhinitis (04/25/2012), Arthritis, Asthma, chronic (10/21/2010), Chronic pain syndrome (02/03/2011), Complication of anesthesia, COPD (chronic obstructive pulmonary disease) (HCC), Dyspnea, GERD (gastroesophageal reflux disease), Hyperlipidemia, Hypertension, Obesity (BMI 30-39.9), Pneumonia (09/2022), and Substance abuse (HCC).   Surgical History:   Past Surgical History:  Procedure Laterality Date   ALVEOLOPLASTY N/A 05/12/2020   Procedure: ALVEOLOPLASTY;  Surgeon: Ocie Doyne, DDS;  Location: [facility];  Service: Oral Surgery;  Laterality: N/A;   CHOLECYSTECTOMY  07/30/89   PELVIC FRACTURE SURGERY  unknown   required surgery on his bladder   TOOTH EXTRACTION N/A 05/12/2020   Procedure: DENTAL RESTORATION/EXTRACTIONS;  Surgeon: Ocie Doyne, DDS;  Location: [facility];  Service: Oral Surgery;  Laterality: N/A;   Social History:   reports that he quit smoking about 13 years ago. His smoking use included cigarettes. He has a 40.00 pack-year smoking history. He has never used smokeless tobacco. He reports that he does not use drugs.   Family History:  His family history includes Alcohol abuse in his father; Pancreatic cancer in his mother; Sarcoidosis in his brother.   Allergies: Allergies  Allergen Reactions   Aspirin Shortness Of Breath   Nsaids Shortness Of Breath   Home Medications: Prior to Admission medications   Medication Sig Start Date End Date Taking? Authorizing Provider  albuterol (PROVENTIL) (2.5 MG/3ML) 0.083% nebulizer solution Take 3 mLs (2.5 mg total) by  nebulization every 6 (six) hours as needed for wheezing or shortness of breath. 01/11/23  Yes Luciano Cutter, MD  amLODipine (NORVASC) 10 MG tablet Take 1 tablet (10 mg total) by mouth daily. TAKE 1 TABLET(10 MG) BY MOUTH DAILY Strength: 10 mg 09/27/22 01/11/23 Yes Willette Cluster, MD  atorvastatin (LIPITOR) 40 MG tablet Take 1 tablet (40 mg total) by mouth daily. 09/27/22 09/27/23 Yes Willette Cluster, MD  Budeson-Glycopyrrol-Formoterol (BREZTRI AEROSPHERE) 160-9-4.8 MCG/ACT AERO Inhale 2 puffs into the lungs in the morning and at bedtime. 01/11/23  Yes Luciano Cutter, MD  fluticasone [facility]) 50 MCG/ACT nasal spray Place 1 spray into both nostrils daily. 10/07/22  Yes Katsadouros, Vasilios, MD  losartan (COZAAR) 100 MG tablet Take 1 tablet (100 mg total) by mouth daily. 09/27/22  Yes Willette Cluster, MD  pregabalin (LYRICA) 100 MG capsule TAKE 1 CAPSULE(100 MG) BY MOUTH THREE TIMES DAILY Patient taking differently: Take 100 mg by mouth daily as needed (pain). TAKE 1 CAPSULE(100 MG) BY MOUTH THREE TIMES DAILY 10/07/22  Yes Katsadouros, Vasilios, MD  tamsulosin (FLOMAX) 0.4 MG CAPS capsule TAKE 1 CAPSULE(0.4 MG) BY MOUTH DAILY Patient taking differently: Take 0.4 mg by mouth daily. 01/10/23  Yes  Katsadouros, Vasilios, MD  VENTOLIN HFA 108 (90 Base) MCG/ACT inhaler INHALE 2 PUFFS INTO THE LUNGS EVERY 6 HOURS AS NEEDED FOR WHEEZING OR SHORTNESS OF BREATH 12/01/22  Yes Belva Agee, MD   Signature:   Tim Lair, PA-C [facility] 01/13/23 1:10 PM  Please see [email or web address] for pager details.  From 7A-7P if no response, please call [phone] After hours, please call ELink [phone]

## 2023-01-13 NOTE — Progress Notes (Signed)
Mobility Specialist - Progress Note   01/13/23 1431  Mobility  Activity Ambulated independently in hallway  Level of Assistance Independent  Assistive Device None  Distance Ambulated (ft) 1400 ft  Range of Motion/Exercises Active  Activity Response Tolerated well  Mobility Referral Yes  $Mobility charge 1 Mobility  Mobility Specialist Start Time (ACUTE ONLY) 1420  Mobility Specialist Stop Time (ACUTE ONLY) 1430  Mobility Specialist Time Calculation (min) (ACUTE ONLY) 10 min   Nurse requested Mobility Specialist to perform oxygen saturation test with pt which includes removing pt from oxygen both at rest and while ambulating.  Below are the results from that testing.     Patient Saturations on Room Air at Rest = spO2 94%  Patient Saturations on Room Air while Ambulating = sp02 89-92% .   Reported results to nurse.    Pt was found in bed and agreeable to ambulate. Stated feeling SOB during ambulation and encouraged pursed lip breathing. Had x1 brief standing rest break due to feeling tired. At EOS returned to bed with all needs met and call bell in reach.  Billey Chang Mobility Specialist

## 2023-01-14 DIAGNOSIS — J4551 Severe persistent asthma with (acute) exacerbation: Secondary | ICD-10-CM | POA: Diagnosis not present

## 2023-01-14 DIAGNOSIS — J9601 Acute respiratory failure with hypoxia: Secondary | ICD-10-CM | POA: Diagnosis not present

## 2023-01-14 LAB — BASIC METABOLIC PANEL
Anion gap: 7 (ref 5–15)
BUN: 23 mg/dL (ref 8–23)
CO2: 28 mmol/L (ref 22–32)
Calcium: 8.4 mg/dL — ABNORMAL LOW (ref 8.9–10.3)
Chloride: 104 mmol/L (ref 98–111)
Creatinine, Ser: 1.29 mg/dL — ABNORMAL HIGH (ref 0.61–1.24)
GFR, Estimated: >60 mL/min (ref >60)
Glucose, Bld: 189 mg/dL — ABNORMAL HIGH (ref 70–99)
Potassium: 4.1 mmol/L (ref 3.5–5.1)
Sodium: 139 mmol/L (ref 135–145)

## 2023-01-14 LAB — CBC
HCT: 44.3 % (ref 39.0–52.0)
Hemoglobin: 13.6 g/dL (ref 13.0–17.0)
MCH: 28.6 pg (ref 26.0–34.0)
MCHC: 30.7 g/dL (ref 30.0–36.0)
MCV: 93.1 fL (ref 80.0–100.0)
Platelets: 258 10*3/uL (ref 150–400)
RBC: 4.76 MIL/uL (ref 4.22–5.81)
RDW: 15.8 % — ABNORMAL HIGH (ref 11.5–15.5)
WBC: 11.2 10*3/uL — ABNORMAL HIGH (ref 4.0–10.5)
nRBC: 0 % (ref 0.0–0.2)

## 2023-01-14 NOTE — Consult Note (Signed)
NAME:  [name], MRN:  [id number], DOB:  Sep 09, 1960, LOS: 2 ADMISSION DATE:  01/11/2023 CONSULTATION DATE:  01/13/2023 REFERRING MD:  Jerolyn Center - [facility] CHIEF COMPLAINT: Hypoxia  History of Present Illness:  62 yo male former smoker admitted with hypoxia. He has nasal polyposis, chronic sinusitis, aspirin allergy, and asthma. He has persistent symptoms for past 8 or 9 years. Used to smoke cigarettes and used cocaine, but reports he quit these years ago. 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 Was seen by ENT and was to have sinus surgery, but cancelled due to respiratory issues. Has trouble breathing through his nose. Has cough with clear to yellow phlegm. Has persistent wheezing. Has elevated eosinophil level. Denies eczema. No food allergies. No animal/bird exposures. Followed by Dr. Everardo All in pulmonary office.  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  Breathing better.  Still congested in his sinuses.  Wants to know if he can get oxygen set up at home  Objective:  Blood pressure 121/66, pulse 74, temperature 97.8 F (36.6 C), temperature source Oral, resp. rate (!) 24, height 5\' 11"  (1.803 m), weight 107.5 kg, SpO2 94 %.    FiO2 (%):  [21 %-32 %] 21 %   Intake/Output Summary (Last 24 hours) at 01/14/2023 1003 Last data filed at 01/14/2023 0831 Gross per 24 hour  Intake 563 ml  Output 300 ml  Net 263 ml   Filed Weights   01/12/23 0625  Weight: 107.5 kg   Physical Examination:  General - alert Eyes - pupils reactive ENT - nasal voice quality Cardiac  - regular rate/rhythm, no murmur Chest - few scattered rhonchi, better air movement Abdomen - soft, non tender, + bowel sounds Extremities - no cyanosis, clubbing, or edema Skin - no rashes Neuro - normal strength, moves extremities, follows commands Psych - normal mood and behavior  Assessment & Plan:   Aspirin related respiratory disease (aspirin allergy, chronic sinusitis, nasal polyposis, and asthma). Acute asthma exacerbation. - continue solumedrol 40 mg q12h - continue yupelri, brovana, pulmicort >> resume breztri upon discharge - continue singulair 10 mg at bedtime - continue nasal saline, flonase, azelastine, claritin - continue mucinex, flutter valve - prn albuterol - day 4 of rocephin - follow up with Dr. Cheron Schaumann with ENT as an outpt - will need outpt assessment for biologic agent (dupixent would be good option)  Acute hypoxic respiratory failure. - discussed requirements to qualify for home oxygen set up - ambulatory oximetry prior to discharge  D/w Dr. Jerolyn Center  Labs:      Latest Ref Rng & Units 01/14/2023    5:32 AM 01/13/2023    4:59 AM 01/12/2023    5:15 AM  CMP  Glucose 70 - 99 mg/dL 409  811  914   BUN 8 - 23 mg/dL 23  29  25    Creatinine 0.61 - 1.24 mg/dL 7.82  9.56  2.13   Sodium 135 - 145 mmol/L 139  137  136   Potassium 3.5 - 5.1 mmol/L 4.1  4.1  3.9   Chloride 98 - 111  mmol/L 104  101  100   CO2 22 - 32 mmol/L 28  26  27    Calcium 8.9 - 10.3 mg/dL 8.4  8.4  8.5        Latest Ref Rng & Units 01/14/2023    5:32 AM 01/13/2023    4:59 AM 01/12/2023    5:15 AM  CBC  WBC 4.0 - 10.5 K/uL 11.2  12.1  6.5   Hemoglobin 13.0 - 17.0 g/dL 16.1  09.6  04.5   Hematocrit 39.0 - 52.0 % 44.3  43.9  43.7   Platelets 150 - 400 K/uL 258  263  261     Signature:  Coralyn Helling, MD [facility] Pager - [phone] or [phone] 01/14/2023, 10:04 AM

## 2023-01-14 NOTE — Progress Notes (Signed)
PROGRESS NOTE    [name]  AOZ:[id number] DOB: September 24, 1960 DOA: 01/11/2023 PCP: Belva Agee, MD   Brief Narrative: 62 year old male with history of COPD/asthma who presents from his pulmonology clinic with shortness of breath and acute hypoxic respiratory failure He reports worsening SOB over the past month but particularly over the past 1-2 days. There is wheezing and productive cough associated with this but he denies, chest pain, leg swelling, or fever.     [facility] ED Course: Upon arrival to the ED, patient is found to be afebrile and saturating low 80s on room air with normal heart rate and stable blood pressure.  EKG demonstrates sinus rhythm and chest CT reveals persistent mucoid impaction that is similar to the prior study.  Labs are notable for creatinine 1.27, normal WBC, normal BNP, normal D-dimer, normal troponin, and negative COVID-19 PCR.  Assessment & Plan:   Principal Problem:   Acute respiratory failure with hypoxia (HCC) Active Problems:   Essential hypertension, benign   Acute exacerbation of COPD with asthma (HCC)  Acute hypoxic respiratory failure secondary to asthma COPD exacerbation.  Patient was 87% on room air at the pulmonary office prior to coming into the ER.  Patient was not on oxygen prior to admission to the hospital at home.  He dropped his saturations to 87% on room air with ambulation today.  Continue Solu-Medrol DuoNebs nebulizers O2 titrate as tolerated Will arrange for home oxygen. Continue antibiotics. VBG 7.3/62/26 Appreciate pulmonary input Discussed with [facility] ENT patient with history of nasal polyps, they are hoping to optimize pulmonary status prior to surgery   History of hyperlipidemia on Lipitor  History of hypertension on Norvasc and losartan blood pressure 124/71.  Also on Flomax.  Will decrease Norvasc to 5 mg a day.  BPH continue Flomax  Estimated body mass index is 33.05 kg/m as calculated from the  following:   Height as of this encounter: 5\' 11"  (1.803 m).   Weight as of this encounter: 107.5 kg.  DVT prophylaxis: Lovenox  code Status: Full code Family Communication: None at bedside  disposition Plan:  Status is: Inpatient Remains inpatient appropriate because: Acute hypoxia new onset   Consultants:  PCCM  Procedures: None Antimicrobials: Anti-infectives (From admission, onward)    Start     Dose/Rate Route Frequency Ordered Stop   01/11/23 2015  cefTRIAXone (ROCEPHIN) 2 g in sodium chloride 0.9 % 100 mL IVPB        2 g 200 mL/hr over 30 Minutes Intravenous Every 24 hours 01/11/23 1928 01/16/23 2014       Subjective: He reports he cannot tolerate CPAP at night it makes him more anxious  Objective: Vitals:   01/14/23 0829 01/14/23 0831 01/14/23 1026 01/14/23 1353  BP:   (!) 141/74 137/66  Pulse:   73 79  Resp:    17  Temp:    98.3 F (36.8 C)  TempSrc:    Oral  SpO2: 92% 94%  92%  Weight:      Height:        Intake/Output Summary (Last 24 hours) at 01/14/2023 1428 Last data filed at 01/14/2023 1039 Gross per 24 hour  Intake 320 ml  Output 590 ml  Net -270 ml    Filed Weights   01/12/23 0625  Weight: 107.5 kg    Examination:  General exam: Appears calm and comfortable  Respiratory system-rhonchi to auscultation. Respiratory effort normal. Cardiovascular system: S1 & S2 heard, RRR. No JVD, murmurs, rubs,  gallops or clicks. No pedal edema. Gastrointestinal system: Abdomen is nondistended, soft and nontender. No organomegaly or masses felt. Normal bowel sounds heard. Central nervous system: Alert and oriented. No focal neurological deficits. Extremities: No edema Skin: No rashes, lesions or ulcers Psychiatry: Judgement and insight appear normal. Mood & affect appropriate.     Data Reviewed: I have personally reviewed following labs and imaging studies  CBC: Recent Labs  Lab 01/11/23 0938 01/11/23 1037 01/12/23 0515 01/13/23 0459  01/14/23 0532  WBC 6.5  --  6.5 12.1* 11.2*  NEUTROABS 3.1  --   --   --   --   HGB 15.3 17.0 13.8 13.7 13.6  HCT 48.4 50.0 43.7 43.9 44.3  MCV 90.0  --  90.7 91.5 93.1  PLT 286  --  261 263 258    Basic Metabolic Panel: Recent Labs  Lab 01/11/23 0938 01/11/23 1037 01/12/23 0515 01/13/23 0459 01/14/23 0532  NA 141 142 136 137 139  K 3.8 3.6 3.9 4.1 4.1  CL 102  --  100 101 104  CO2 31  --  27 26 28   GLUCOSE 102*  --  150* 154* 189*  BUN 18  --  25* 29* 23  CREATININE 1.27*  --  1.27* 1.27* 1.29*  CALCIUM 9.6  --  8.5* 8.4* 8.4*  MG 1.9  --   --   --   --   PHOS 3.2  --   --   --   --     GFR: Estimated Creatinine Clearance: 73.1 mL/min (A) (by C-G formula based on SCr of 1.29 mg/dL (H)). Liver Function Tests: No results for input(s): "AST", "ALT", "ALKPHOS", "BILITOT", "PROT", "ALBUMIN" in the last 168 hours. No results for input(s): "LIPASE", "AMYLASE" in the last 168 hours. No results for input(s): "AMMONIA" in the last 168 hours. Coagulation Profile: No results for input(s): "INR", "PROTIME" in the last 168 hours. Cardiac Enzymes: No results for input(s): "CKTOTAL", "CKMB", "CKMBINDEX", "TROPONINI" in the last 168 hours. BNP (last 3 results) No results for input(s): "PROBNP" in the last 8760 hours. HbA1C: No results for input(s): "HGBA1C" in the last 72 hours. CBG: No results for input(s): "GLUCAP" in the last 168 hours. Lipid Profile: No results for input(s): "CHOL", "HDL", "LDLCALC", "TRIG", "CHOLHDL", "LDLDIRECT" in the last 72 hours. Thyroid Function Tests: No results for input(s): "TSH", "T4TOTAL", "FREET4", "T3FREE", "THYROIDAB" in the last 72 hours. Anemia Panel: No results for input(s): "VITAMINB12", "FOLATE", "FERRITIN", "TIBC", "IRON", "RETICCTPCT" in the last 72 hours. Sepsis Labs: No results for input(s): "PROCALCITON", "LATICACIDVEN" in the last 168 hours.  Recent Results (from the past 240 hour(s))  SARS Coronavirus 2 by RT PCR (hospital order,  performed in [facility] hospital lab) *cepheid single result test* Anterior Nasal Swab     Status: None   Collection Time: 01/11/23  9:39 AM   Specimen: Anterior Nasal Swab  Result Value Ref Range Status   SARS Coronavirus 2 by RT PCR NEGATIVE NEGATIVE Final    Comment: (NOTE) SARS-CoV-2 target nucleic acids are NOT DETECTED.  The SARS-CoV-2 RNA is generally detectable in upper and lower respiratory specimens during the acute phase of infection. The lowest concentration of SARS-CoV-2 viral copies this assay can detect is 250 copies / mL. A negative result does not preclude SARS-CoV-2 infection and should not be used as the sole basis for treatment or other patient management decisions.  A negative result may occur with improper specimen collection / handling, submission of specimen other  than nasopharyngeal swab, presence of viral mutation(s) within the areas targeted by this assay, and inadequate number of viral copies (<250 copies / mL). A negative result must be combined with clinical observations, patient history, and epidemiological information.  Fact Sheet for Patients:   RoadLapTop.co.za  Fact Sheet for Healthcare Providers: http://kim-miller.com/  This test is not yet approved or  cleared by the [place] FDA and has been authorized for detection and/or diagnosis of SARS-CoV-2 by FDA under an Emergency Use Authorization (EUA).  This EUA will remain in effect (meaning this test can be used) for the duration of the COVID-19 declaration under Section 564(b)(1) of the Act, 21 U.S.C. section 360bbb-3(b)(1), unless the authorization is terminated or revoked sooner.  Performed at [facility], [address], [place], [place] [address]          Radiology Studies: No results found.      Scheduled Meds:  amLODipine  5 mg Oral Daily   arformoterol  15 mcg Nebulization BID   atorvastatin  40 mg Oral  Daily   azelastine  1 spray Each Nare BID   budesonide (PULMICORT) nebulizer solution  0.5 mg Nebulization BID   enoxaparin (LOVENOX) injection  40 mg Subcutaneous QHS   fluticasone  1 spray Each Nare BID   guaiFENesin  1,200 mg Oral BID   loratadine  10 mg Oral Daily   losartan  100 mg Oral Daily   methylPREDNISolone (SOLU-MEDROL) injection  40 mg Intravenous Q12H   montelukast  10 mg Oral QHS   revefenacin  175 mcg Nebulization Daily   sodium chloride  2 spray Each Nare BID   sodium chloride flush  3 mL Intravenous Q12H   tamsulosin  0.4 mg Oral Daily   Continuous Infusions:  cefTRIAXone (ROCEPHIN)  IV Stopped (01/13/23 2212)     LOS: 2 days    Time spent: 39 min  Alwyn Ren, MD  01/14/2023, 2:28 PM

## 2023-01-14 NOTE — Progress Notes (Signed)
   01/13/23 2340  BiPAP/CPAP/SIPAP  $ Non-Invasive Ventilator  Non-Invasive Vent Set Up;Non-Invasive Vent Initial  BiPAP/CPAP/SIPAP Pt Type Adult  BiPAP/CPAP/SIPAP DREAMSTATIOND  Mask Type Full face mask  Mask Size Medium  Set Rate 0 breaths/min  Respiratory Rate 16 breaths/min  EPAP 10 cmH2O  FiO2 (%) 21 %  Flow Rate 0 lpm  Patient Home Equipment No  Auto Titrate No  CPAP/SIPAP surface wiped down Yes   Pt. placed on CPAP per order, remains on R/A, tolerating well.

## 2023-01-14 NOTE — Plan of Care (Signed)
  Problem: Activity: Goal: Risk for activity intolerance will decrease Outcome: Progressing   Problem: Nutrition: Goal: Adequate nutrition will be maintained Outcome: Progressing   Problem: Elimination: Goal: Will not experience complications related to bowel motility Outcome: Progressing Goal: Will not experience complications related to urinary retention Outcome: Progressing   Problem: Pain Managment: Goal: General experience of comfort will improve Outcome: Progressing   

## 2023-01-14 NOTE — Progress Notes (Signed)
Mobility Specialist - Progress Note  (RA) Pre-mobility: 77 bpm HR, 94% SpO2 During mobility: 87% SpO2 Post-mobility: 90% SPO2  (2L) Pre-mobility: 81 bpm HR, 95% SpO2 During mobility: 92-94% SpO2 Post-mobility: 80 bpm HR, 95% SPO2   01/14/23 1056  Mobility  Activity Ambulated independently in hallway  Level of Assistance Independent  Assistive Device None  Distance Ambulated (ft) 1000 ft  Range of Motion/Exercises Active  Activity Response Tolerated well  Mobility Referral Yes  $Mobility charge 1 Mobility  Mobility Specialist Start Time (ACUTE ONLY) 1037  Mobility Specialist Stop Time (ACUTE ONLY) 1055  Mobility Specialist Time Calculation (min) (ACUTE ONLY) 18 min   Pt was found in bed on RA and agreeable to ambulate. Dropped to 87% after ambulating ~350'. Took a standing rest break for ~66min and SPO2 90%. Returned to room pt was left sitting EOB and reported to RN. RN advised to try ambulation on 2L. Pt ambulated ~500' and stated feeling a difference between both. Pt returned to bed with all needs met and call bell in reach. Reported findings to RN.  Billey Chang Mobility Specialist

## 2023-01-15 DIAGNOSIS — J449 Chronic obstructive pulmonary disease, unspecified: Secondary | ICD-10-CM | POA: Diagnosis not present

## 2023-01-15 DIAGNOSIS — R32 Unspecified urinary incontinence: Secondary | ICD-10-CM | POA: Diagnosis not present

## 2023-01-15 DIAGNOSIS — J4551 Severe persistent asthma with (acute) exacerbation: Secondary | ICD-10-CM | POA: Diagnosis not present

## 2023-01-15 DIAGNOSIS — J9601 Acute respiratory failure with hypoxia: Secondary | ICD-10-CM | POA: Diagnosis not present

## 2023-01-15 DIAGNOSIS — I10 Essential (primary) hypertension: Secondary | ICD-10-CM | POA: Diagnosis not present

## 2023-01-15 MED ORDER — AZELASTINE HCL 0.1 % NA SOLN: 1.0000 | Freq: Two times a day (BID) | NASAL | 12 refills | Status: DC

## 2023-01-15 MED ORDER — ACETAMINOPHEN 325 MG PO TABS: 650.0000 mg | ORAL_TABLET | Freq: Four times a day (QID) | ORAL | Status: DC | PRN

## 2023-01-15 MED ORDER — LORATADINE 10 MG PO TABS: 10.0000 mg | ORAL_TABLET | Freq: Every day | ORAL | Status: AC

## 2023-01-15 MED ORDER — GUAIFENESIN ER 600 MG PO TB12: 1200.0000 mg | ORAL_TABLET | Freq: Two times a day (BID) | ORAL | Status: DC

## 2023-01-15 MED ORDER — MONTELUKAST SODIUM 10 MG PO TABS: 10.0000 mg | ORAL_TABLET | Freq: Every day | ORAL | 2 refills | Status: DC

## 2023-01-15 MED ORDER — POLYETHYLENE GLYCOL 3350 17 G PO PACK: 17.0000 g | PACK | Freq: Every day | ORAL | 0 refills | Status: AC | PRN

## 2023-01-15 NOTE — Progress Notes (Signed)
Mobility Specialist - Progress Note   01/15/23 1043  Oxygen Therapy  SpO2 (!) 87 %  O2 Device Room Air  Patient Activity (if Appropriate) Ambulating  Mobility  Activity Ambulated independently in hallway  Level of Assistance Independent  Assistive Device None  Distance Ambulated (ft) 480 ft  Activity Response Tolerated well  Mobility Referral Yes  $Mobility charge 1 Mobility  Mobility Specialist Start Time (ACUTE ONLY) 1012  Mobility Specialist Stop Time (ACUTE ONLY) 1019  Mobility Specialist Time Calculation (min) (ACUTE ONLY) 7 min   Nurse requested Mobility Specialist to perform oxygen saturation test with pt which includes removing pt from oxygen both at rest and while ambulating.  Below are the results from that testing.      Patient Saturations on Room Air while Ambulating = sp02 87% .  Rested and performed pursed lip breathing for 1 minute with sp02 at 90%  Reported results to nurse.    Pt received in bed and agreeable to mobility. During ambulation, pt desat to 87%. Encouraged pursed lip breaths allowing O2 to come back up to 90%. No complaints during session. Pt to bathroom after session with all needs met.   During mobility: 94 HR, 87%-90% SpO2 (RA) Post-mobility: 90 HR, 91% SPO2 (RA)  Chief Technology Officer

## 2023-01-15 NOTE — Progress Notes (Signed)
   01/15/23 0010  BiPAP/CPAP/SIPAP  Reason BIPAP/CPAP not in use Other(comment) (pt said it was uncomfortable)  BiPAP/CPAP /SiPAP Vitals  Bilateral Breath Sounds Clear;Diminished  MEWS Score/Color  MEWS Score 0  MEWS Score Color [place] Si

## 2023-01-15 NOTE — Progress Notes (Signed)
NAME:  [name], MRN:  [id number], DOB:  20-Aug-1960, LOS: 3 ADMISSION DATE:  01/11/2023 CONSULTATION DATE:  01/13/2023 REFERRING MD:  Jerolyn Center - [facility] CHIEF COMPLAINT: Hypoxia  History of Present Illness:  62 yo male former smoker admitted with hypoxia. He has nasal polyposis, chronic sinusitis, aspirin allergy, and asthma. He has persistent symptoms for past 8 or 9 years. Used to smoke cigarettes and used cocaine, but reports he quit these years ago. 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 Was seen by ENT and was to have sinus surgery, but cancelled due to respiratory issues. Has trouble breathing through his nose. Has cough with clear to yellow phlegm. Has persistent wheezing. Has elevated eosinophil level. Denies eczema. No food allergies. No animal/bird exposures. Followed by Dr. Everardo All in pulmonary office.  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  Feels like he can finally breath some through his nose.  Objective:  Blood pressure 132/78, pulse 66, temperature 98.3 F (36.8 C), temperature source Oral, resp. rate 17, height 5\' 11"  (1.803 m), weight 107.5 kg, SpO2 (!) 87 %.    FiO2 (%):  [21 %] 21 %   Intake/Output Summary (Last 24 hours) at 01/15/2023 1205 Last data filed at 01/15/2023 0900 Gross per 24 hour  Intake 960 ml  Output 1350 ml  Net -390 ml   Filed Weights   01/12/23 0625  Weight: 107.5 kg   Physical Examination:  General - alert Eyes - pupils reactive ENT - nasally voice Cardiac - regular rate/rhythm, no murmur Chest - scattered  rhonchi Abdomen - soft, non tender, + bowel sounds Extremities - no cyanosis, clubbing, or edema Skin - no rashes Neuro - normal strength, moves extremities, follows commands Psych - normal mood and behavior   Assessment & Plan:   Aspirin related respiratory disease (aspirin allergy, chronic sinusitis, nasal polyposis, and asthma). Acute asthma exacerbation. - continue solumedrol 40 mg q12h >> suspect he can transition to prednisone in next 24 to 48 hrs - continue yupelri, brovana, pulmicort >> resume breztri upon discharge - continue singulair 10 mg at bedtime - continue nasal saline, flonase, azelastine, claritin - continue mucinex, flutter valve - prn albuterol - day 5 of rocephin - follow up with Dr. Cheron Schaumann with ENT as an outpt - will need outpt assessment for biologic agent (dupixent would be good option)  Acute hypoxic respiratory failure. - discussed requirements to qualify for home oxygen set up - ambulatory oximetry prior to discharge   Labs:      Latest Ref Rng & Units 01/14/2023    5:32 AM 01/13/2023    4:59 AM 01/12/2023    5:15 AM  CMP  Glucose 70 - 99 mg/dL 536  644  034   BUN 8 - 23 mg/dL 23  29  25    Creatinine 0.61 - 1.24 mg/dL 7.42  5.95  6.38   Sodium 135 - 145 mmol/L 139  137  136   Potassium 3.5 - 5.1 mmol/L 4.1  4.1  3.9   Chloride 98 - 111 mmol/L 104  101  100   CO2  22 - 32 mmol/L 28  26  27    Calcium 8.9 - 10.3 mg/dL 8.4  8.4  8.5        Latest Ref Rng & Units 01/14/2023    5:32 AM 01/13/2023    4:59 AM 01/12/2023    5:15 AM  CBC  WBC 4.0 - 10.5 K/uL 11.2  12.1  6.5   Hemoglobin 13.0 - 17.0 g/dL 62.1  30.8  65.7   Hematocrit 39.0 - 52.0 % 44.3  43.9  43.7   Platelets 150 - 400 K/uL 258  263  261     Signature:  Coralyn Helling, MD [facility] Pager - [phone] or [phone] 01/15/2023, 12:05 PM

## 2023-01-15 NOTE — Progress Notes (Signed)
PROGRESS NOTE    [name]  WUJ:[id number] DOB: January 21, 1961 DOA: 01/11/2023 PCP: Belva Agee, MD   Brief Narrative: 62 year old male with history of COPD/asthma who presents from his pulmonology clinic with shortness of breath and acute hypoxic respiratory failure He reports worsening SOB over the past month but particularly over the past 1-2 days. There is wheezing and productive cough associated with this but he denies, chest pain, leg swelling, or fever.     [facility] ED Course: Upon arrival to the ED, patient is found to be afebrile and saturating low 80s on room air with normal heart rate and stable blood pressure.  EKG demonstrates sinus rhythm and chest CT reveals persistent mucoid impaction that is similar to the prior study.  Labs are notable for creatinine 1.27, normal WBC, normal BNP, normal D-dimer, normal troponin, and negative COVID-19 PCR.  Assessment & Plan:   Principal Problem:   Acute respiratory failure with hypoxia (HCC) Active Problems:   Essential hypertension, benign   Acute exacerbation of COPD with asthma (HCC)  Acute hypoxic respiratory failure secondary to asthma COPD exacerbation.  Patient was 87% on room air at the pulmonary office prior to coming into the ER.  Patient was not on oxygen prior to admission to the hospital at home.  He dropped his saturations to 87% on room air with ambulation today.  Continue Solu-Medrol DuoNebs nebulizers O2 titrate as tolerated Pulse ox dropped to 87% again today. Will arrange for home oxygen. Continue antibiotics. VBG 7.3/62/26 Appreciate pulmonary input Discussed with [facility] ENT patient with history of nasal polyps, they are hoping to optimize pulmonary status prior to surgery   History of hyperlipidemia on Lipitor  History of hypertension on Norvasc and losartan blood pressure 132/78.  Also on Flomax.  Will decrease Norvasc to 5 mg a day.  BPH continue Flomax  Estimated body mass index is  33.05 kg/m as calculated from the following:   Height as of this encounter: 5\' 11"  (1.803 m).   Weight as of this encounter: 107.5 kg.  DVT prophylaxis: Lovenox  code Status: Full code Family Communication: None at bedside  disposition Plan:  Status is: Inpatient Remains inpatient appropriate because: Acute hypoxia new onset   Consultants:  PCCM  Procedures: None Antimicrobials: Anti-infectives (From admission, onward)    Start     Dose/Rate Route Frequency Ordered Stop   01/11/23 2015  cefTRIAXone (ROCEPHIN) 2 g in sodium chloride 0.9 % 100 mL IVPB        2 g 200 mL/hr over 30 Minutes Intravenous Every 24 hours 01/11/23 1928 01/16/23 2014       Subjective: He reports he cannot tolerate CPAP at night it makes him more anxious  Objective: Vitals:   01/15/23 0741 01/15/23 0744 01/15/23 0944 01/15/23 1043  BP:   132/78   Pulse:      Resp:  17    Temp:      TempSrc:      SpO2: 98%   (!) 87%  Weight:      Height:        Intake/Output Summary (Last 24 hours) at 01/15/2023 1224 Last data filed at 01/15/2023 0900 Gross per 24 hour  Intake 960 ml  Output 1350 ml  Net -390 ml    Filed Weights   01/12/23 0625  Weight: 107.5 kg    Examination:  General exam: Appears calm and comfortable  Respiratory system-rhonchi to auscultation. Respiratory effort normal. Cardiovascular system: S1 & S2 heard, RRR.  No JVD, murmurs, rubs, gallops or clicks. No pedal edema. Gastrointestinal system: Abdomen is nondistended, soft and nontender. No organomegaly or masses felt. Normal bowel sounds heard. Central nervous system: Alert and oriented. No focal neurological deficits. Extremities: No edema Skin: No rashes, lesions or ulcers Psychiatry: Judgement and insight appear normal. Mood & affect appropriate.     Data Reviewed: I have personally reviewed following labs and imaging studies  CBC: Recent Labs  Lab 01/11/23 0938 01/11/23 1037 01/12/23 0515 01/13/23 0459  01/14/23 0532  WBC 6.5  --  6.5 12.1* 11.2*  NEUTROABS 3.1  --   --   --   --   HGB 15.3 17.0 13.8 13.7 13.6  HCT 48.4 50.0 43.7 43.9 44.3  MCV 90.0  --  90.7 91.5 93.1  PLT 286  --  261 263 258    Basic Metabolic Panel: Recent Labs  Lab 01/11/23 0938 01/11/23 1037 01/12/23 0515 01/13/23 0459 01/14/23 0532  NA 141 142 136 137 139  K 3.8 3.6 3.9 4.1 4.1  CL 102  --  100 101 104  CO2 31  --  27 26 28   GLUCOSE 102*  --  150* 154* 189*  BUN 18  --  25* 29* 23  CREATININE 1.27*  --  1.27* 1.27* 1.29*  CALCIUM 9.6  --  8.5* 8.4* 8.4*  MG 1.9  --   --   --   --   PHOS 3.2  --   --   --   --     GFR: Estimated Creatinine Clearance: 73.1 mL/min (A) (by C-G formula based on SCr of 1.29 mg/dL (H)). Liver Function Tests: No results for input(s): "AST", "ALT", "ALKPHOS", "BILITOT", "PROT", "ALBUMIN" in the last 168 hours. No results for input(s): "LIPASE", "AMYLASE" in the last 168 hours. No results for input(s): "AMMONIA" in the last 168 hours. Coagulation Profile: No results for input(s): "INR", "PROTIME" in the last 168 hours. Cardiac Enzymes: No results for input(s): "CKTOTAL", "CKMB", "CKMBINDEX", "TROPONINI" in the last 168 hours. BNP (last 3 results) No results for input(s): "PROBNP" in the last 8760 hours. HbA1C: No results for input(s): "HGBA1C" in the last 72 hours. CBG: No results for input(s): "GLUCAP" in the last 168 hours. Lipid Profile: No results for input(s): "CHOL", "HDL", "LDLCALC", "TRIG", "CHOLHDL", "LDLDIRECT" in the last 72 hours. Thyroid Function Tests: No results for input(s): "TSH", "T4TOTAL", "FREET4", "T3FREE", "THYROIDAB" in the last 72 hours. Anemia Panel: No results for input(s): "VITAMINB12", "FOLATE", "FERRITIN", "TIBC", "IRON", "RETICCTPCT" in the last 72 hours. Sepsis Labs: No results for input(s): "PROCALCITON", "LATICACIDVEN" in the last 168 hours.  Recent Results (from the past 240 hour(s))  SARS Coronavirus 2 by RT PCR (hospital order,  performed in [facility] hospital lab) *cepheid single result test* Anterior Nasal Swab     Status: None   Collection Time: 01/11/23  9:39 AM   Specimen: Anterior Nasal Swab  Result Value Ref Range Status   SARS Coronavirus 2 by RT PCR NEGATIVE NEGATIVE Final    Comment: (NOTE) SARS-CoV-2 target nucleic acids are NOT DETECTED.  The SARS-CoV-2 RNA is generally detectable in upper and lower respiratory specimens during the acute phase of infection. The lowest concentration of SARS-CoV-2 viral copies this assay can detect is 250 copies / mL. A negative result does not preclude SARS-CoV-2 infection and should not be used as the sole basis for treatment or other patient management decisions.  A negative result may occur with improper specimen collection / handling,  submission of specimen other than nasopharyngeal swab, presence of viral mutation(s) within the areas targeted by this assay, and inadequate number of viral copies (<250 copies / mL). A negative result must be combined with clinical observations, patient history, and epidemiological information.  Fact Sheet for Patients:   RoadLapTop.co.za  Fact Sheet for Healthcare Providers: http://kim-miller.com/  This test is not yet approved or  cleared by the [place] FDA and has been authorized for detection and/or diagnosis of SARS-CoV-2 by FDA under an Emergency Use Authorization (EUA).  This EUA will remain in effect (meaning this test can be used) for the duration of the COVID-19 declaration under Section 564(b)(1) of the Act, 21 U.S.C. section 360bbb-3(b)(1), unless the authorization is terminated or revoked sooner.  Performed at [facility], [address], [place], [place] [address]          Radiology Studies: No results found.      Scheduled Meds:  amLODipine  5 mg Oral Daily   arformoterol  15 mcg Nebulization BID   atorvastatin  40 mg Oral  Daily   azelastine  1 spray Each Nare BID   budesonide (PULMICORT) nebulizer solution  0.5 mg Nebulization BID   enoxaparin (LOVENOX) injection  40 mg Subcutaneous QHS   fluticasone  1 spray Each Nare BID   guaiFENesin  1,200 mg Oral BID   loratadine  10 mg Oral Daily   losartan  100 mg Oral Daily   methylPREDNISolone (SOLU-MEDROL) injection  40 mg Intravenous Q12H   montelukast  10 mg Oral QHS   revefenacin  175 mcg Nebulization Daily   sodium chloride  2 spray Each Nare BID   sodium chloride flush  3 mL Intravenous Q12H   tamsulosin  0.4 mg Oral Daily   Continuous Infusions:  cefTRIAXone (ROCEPHIN)  IV 2 g (01/14/23 2126)     LOS: 3 days    Time spent: 39 min  Alwyn Ren, MD  01/15/2023, 12:24 PM

## 2023-01-16 ENCOUNTER — Telehealth: Payer: Self-pay | Admitting: Pulmonary Disease

## 2023-01-16 DIAGNOSIS — J9601 Acute respiratory failure with hypoxia: Secondary | ICD-10-CM | POA: Diagnosis not present

## 2023-01-16 MED ORDER — PREDNISONE 10 MG PO TABS: ORAL_TABLET | ORAL | 0 refills | Status: AC

## 2023-01-16 NOTE — Telephone Encounter (Signed)
Please arrange hospital follow-up with Dr. Everardo All or someone else at [facility] in the next 2 to 4 weeks if able.  Thank you.

## 2023-01-16 NOTE — Progress Notes (Signed)
Patient oxygen level decreases to 89% on room air while ambulating 250 ft; patient oxygen level recovers to 97% on room air while sitting. Doctor notified

## 2023-01-16 NOTE — Progress Notes (Signed)
NAME:  [name], MRN:  [id number], DOB:  06/25/1961, LOS: 4 ADMISSION DATE:  01/11/2023 CONSULTATION DATE:  01/13/2023 REFERRING MD:  Jerolyn Center - [facility] CHIEF COMPLAINT: Hypoxia  History of Present Illness:  62 yo male former smoker admitted with hypoxia. He has nasal polyposis, chronic sinusitis, aspirin allergy, and asthma. He has persistent symptoms for past 8 or 9 years. Used to smoke cigarettes and used cocaine, but reports he quit these years ago. 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 Was seen by ENT and was to have sinus surgery, but cancelled due to respiratory issues. Has trouble breathing through his nose. Has cough with clear to yellow phlegm. Has persistent wheezing. Has elevated eosinophil level. Denies eczema. No food allergies. No animal/bird exposures. Followed by Dr. Everardo All in pulmonary office.  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  Objective:  Blood pressure (!) 149/75, pulse 72, temperature 98.4 F (36.9 C), temperature source Oral, resp. rate 17, height 5\' 11"  (1.803 m), weight 107.5 kg, SpO2 96 %.        Intake/Output Summary (Last 24 hours) at 01/16/2023 1203 Last data filed at 01/16/2023 5621 Gross per 24 hour  Intake 690 ml  Output 900 ml  Net -210 ml    Filed Weights   01/12/23 0625  Weight: 107.5 kg   Physical Examination:  General - alert Eyes - pupils reactive ENT - nasally voice Cardiac - regular rate/rhythm, no murmur Chest - scattered rhonchi Abdomen - soft, non tender, + bowel sounds Extremities - no cyanosis,  clubbing, or edema Skin - no rashes Neuro - normal strength, moves extremities, follows commands Psych - normal mood and behavior   Assessment & Plan:   Aspirin related respiratory disease (aspirin allergy, chronic sinusitis, nasal polyposis, and asthma). Acute asthma exacerbation. - Recommend prednisone 40 mg for 5 days, 20 mg for 5 days then stop - continue yupelri, brovana, pulmicort >> prescribe Symbicort 160-4.5  2 puff BID and  Spiriva Respimat 2.5 mcg 2 puff daily upon discharge will need new prescriptions (breztri is not on medicaid formulary and sample is empty) - continue singulair 10 mg at bedtime - continue nasal saline, flonase, azelastine, claritin - continue mucinex, flutter valve - prn albuterol - s/p adequate abx - follow up with Dr. Cheron Schaumann with ENT as an outpt - will need outpt assessment for biologic agent   Acute hypoxic respiratory failure. Now on room air at rest. - ambulatory oximetry prior to discharge  Will request outpatient pulmonary follow up. PCCM will sign off.   Labs:      Latest Ref Rng & Units 01/14/2023    5:32 AM 01/13/2023    4:59 AM 01/12/2023    5:15 AM  CMP  Glucose 70 - 99 mg/dL 308  657  846   BUN 8 - 23 mg/dL 23  29  25    Creatinine 0.61 - 1.24 mg/dL 9.62  9.52  8.41   Sodium 135 - 145 mmol/L 139  137  136   Potassium 3.5 - 5.1 mmol/L 4.1  4.1  3.9  Chloride 98 - 111 mmol/L 104  101  100   CO2 22 - 32 mmol/L 28  26  27    Calcium 8.9 - 10.3 mg/dL 8.4  8.4  8.5        Latest Ref Rng & Units 01/14/2023    5:32 AM 01/13/2023    4:59 AM 01/12/2023    5:15 AM  CBC  WBC 4.0 - 10.5 K/uL 11.2  12.1  6.5   Hemoglobin 13.0 - 17.0 g/dL 16.1  09.6  04.5   Hematocrit 39.0 - 52.0 % 44.3  43.9  43.7   Platelets 150 - 400 K/uL 258  263  261     Signature:  Karren Burly, MD [facility] Pulmonary/Critical Care See AMION or [phone] 01/16/2023, 12:03 PM

## 2023-01-16 NOTE — Progress Notes (Signed)
   01/15/23 2033  BiPAP/CPAP/SIPAP  Reason BIPAP/CPAP not in use Other(comment) (patient does not want to wear tonight)  BiPAP/CPAP /SiPAP Vitals  SpO2 97 %

## 2023-01-16 NOTE — TOC Progression Note (Signed)
Transition of Care [facility]) - Progression Note    Patient Details  Name: [name] MRN: [id number] Date of Birth: 1960/11/06  Transition of Care [facility] CM/SW Contact  Howell Rucks, RN Phone Number: 01/16/2023, 11:05 AM  Clinical Narrative:  02 sats ordered by MD for possible home 02, planning dc today. Pt does not meet criteria for home 02. TOC will continue to follow.     Expected Discharge Plan: Home/Self Care Barriers to Discharge: Continued Medical Work up  Expected Discharge Plan and Services   Discharge Planning Services: CM Consult   Living arrangements for the past 2 months: Single Family Home Expected Discharge Date: 01/16/23                                     Social Determinants of Health (SDOH) Interventions SDOH Screenings   Food Insecurity: No Food Insecurity (01/11/2023)  Housing: Low Risk  (01/11/2023)  Transportation Needs: No Transportation Needs (01/11/2023)  Utilities: Not At Risk (01/11/2023)  Alcohol Screen: Low Risk  (09/27/2022)  Depression (PHQ2-9): Low Risk  (09/27/2022)  Financial Resource Strain: Low Risk  (09/27/2022)  Physical Activity: Insufficiently Active (09/27/2022)  Social Connections: Socially Isolated (09/27/2022)  Stress: No Stress Concern Present (09/27/2022)  Tobacco Use: Medium Risk (01/11/2023)    Readmission Risk Interventions     No data to display

## 2023-01-17 NOTE — Discharge Summary (Signed)
Physician Discharge Summary  [name] NWG:[id number] DOB: 1961/01/17 DOA: 01/11/2023  PCP: Belva Agee, MD  Admit date: 01/11/2023 Discharge date:01/16/2023  Admitted From: HOME Disposition:  HOME  Recommendations for Outpatient Follow-up:  Follow up with PCP in 1-2 weeks Please obtain BMP/CBC in one week Please follow up with PCCM Home Health:NONE Equipment/Devices NONE  Discharge Condition:STABLE  CODE STATUS:full  Diet recommendation: cardiac Brief/Interim Summary:: 62 year old male with history of COPD/asthma who presents from his pulmonology clinic with shortness of breath and acute hypoxic respiratory failure He reports worsening SOB over the past month but particularly over the past 1-2 days. There is wheezing and productive cough associated with this but he denies, chest pain, leg swelling, or fever.     [facility] ED Course: Upon arrival to the ED, patient is found to be afebrile and saturating low 80s on room air with normal heart rate and stable blood pressure.  EKG demonstrates sinus rhythm and chest CT reveals persistent mucoid impaction that is similar to the prior study.  Labs are notable for creatinine 1.27, normal WBC, normal BNP, normal D-dimer, normal troponin, and negative COVID-19 PCR.  Discharge Diagnoses:  Principal Problem:   Acute respiratory failure with hypoxia (HCC) Active Problems:   Essential hypertension, benign   Acute exacerbation of COPD with asthma (HCC)    Acute hypoxic respiratory failure secondary to asthma COPD exacerbation.  Patient was 87% on room air at the pulmonary office prior to coming into the ER.  Patient was not on oxygen prior to admission to the hospital at home.  On the day of discharge she was maintaining his oxygen saturation on room air above 90% with ambulation.  He was treated with Solu-Medrol DuoNebs nebulizers O2 .  He was discharged on a steroid taper. He was seen by PCCM during the hospital stay.   Discussed with [facility] ENT patient with history of nasal polyps, chronic sinusitis, aspirin allergy and asthma they are hoping to optimize pulmonary status prior to surgery.  He was supposed to have surgery in March or 2024 this was canceled by the anesthesia as his pulmonary status was not optimized.   History of hyperlipidemia on Lipitor   hypertension continue home meds   BPH continue Flomax  Estimated body mass index is 33.05 kg/m as calculated from the following:   Height as of this encounter: 5\' 11"  (1.803 m).   Weight as of this encounter: 107.5 kg.  Discharge Instructions  Discharge Instructions     Diet - low sodium heart healthy   Complete by: As directed    Increase activity slowly   Complete by: As directed       Allergies as of 01/16/2023       Reactions   Aspirin Shortness Of Breath   Nsaids Shortness Of Breath        Medication List     STOP taking these medications    amLODipine 10 MG tablet Commonly known as: NORVASC       TAKE these medications    acetaminophen 325 MG tablet Commonly known as: TYLENOL Take 2 tablets (650 mg total) by mouth every 6 (six) hours as needed for mild pain (or Fever >/= 101).   atorvastatin 40 MG tablet Commonly known as: Lipitor Take 1 tablet (40 mg total) by mouth daily.   azelastine 0.1 % nasal spray Commonly known as: ASTELIN Place 1 spray into both nostrils 2 (two) times daily. Use in each nostril as directed   [employer]  Aerosphere 160-9-4.8 MCG/ACT Aero Generic drug: Budeson-Glycopyrrol-Formoterol Inhale 2 puffs into the lungs in the morning and at bedtime.   fluticasone 50 MCG/ACT nasal spray Commonly known as: FLONASE Place 1 spray into both nostrils daily.   guaiFENesin 600 MG 12 hr tablet Commonly known as: MUCINEX Take 2 tablets (1,200 mg total) by mouth 2 (two) times daily.   loratadine 10 MG tablet Commonly known as: CLARITIN Take 1 tablet (10 mg total) by mouth daily.   losartan 100  MG tablet Commonly known as: COZAAR Take 1 tablet (100 mg total) by mouth daily.   montelukast 10 MG tablet Commonly known as: SINGULAIR Take 1 tablet (10 mg total) by mouth at bedtime.   polyethylene glycol 17 g packet Commonly known as: MIRALAX / GLYCOLAX Take 17 g by mouth daily as needed for mild constipation.   predniSONE 10 MG tablet Commonly known as: DELTASONE Take 4 tablets daily for 3 days 6/18 to 6/20 Take 3 tablets daily for the next 3 days 6/21 to 6/23 Take 2 tablets daily for the following 3 days 6/24 to 6/26 1 tablet daily till done   pregabalin 100 MG capsule Commonly known as: LYRICA TAKE 1 CAPSULE(100 MG) BY MOUTH THREE TIMES DAILY What changed:  how much to take how to take this when to take this reasons to take this   tamsulosin 0.4 MG Caps capsule Commonly known as: FLOMAX TAKE 1 CAPSULE(0.4 MG) BY MOUTH DAILY What changed: See the new instructions.   Ventolin HFA 108 (90 Base) MCG/ACT inhaler Generic drug: albuterol INHALE 2 PUFFS INTO THE LUNGS EVERY 6 HOURS AS NEEDED FOR WHEEZING OR SHORTNESS OF BREATH   albuterol (2.5 MG/3ML) 0.083% nebulizer solution Commonly known as: PROVENTIL Take 3 mLs (2.5 mg total) by nebulization every 6 (six) hours as needed for wheezing or shortness of breath.        Follow-up Information     Skotnicki, Meghan A, DO Follow up.   Specialty: Otolaryngology Contact information: [address] [place] [address] [phone]                Allergies  Allergen Reactions   Aspirin Shortness Of Breath   Nsaids Shortness Of Breath    Consultations: PCCM   Procedures/Studies: CT Chest Wo Contrast  Result Date: 01/11/2023 CLINICAL DATA:  Dyspnea, chronic, unclear etiology mucoid impaction EXAM: CT CHEST WITHOUT CONTRAST TECHNIQUE: Multidetector CT imaging of the chest was performed following the standard protocol without IV contrast. RADIATION DOSE REDUCTION: This exam was performed  according to the departmental dose-optimization program which includes automated exposure control, adjustment of the mA and/or kV according to patient size and/or use of iterative reconstruction technique. COMPARISON:  CT 07/17/2023 FINDINGS: Cardiovascular: Heart size within normal limits. No pericardial effusion. Thoracic aorta is nonaneurysmal. Scattered atherosclerotic vascular calcifications of the aorta and coronary arteries. Central pulmonary vasculature within normal limits. Mediastinum/Nodes: Mildly enlarged pretracheal and lower right paratracheal lymph nodes measuring 10 mm short axis (series 2, images 49 and 68). These are stable in size compared to the prior CT. No axillary lymphadenopathy. Evaluation of the hilar structures is limited in the absence of intravenous contrast. Within this limitation, no obvious hilar adenopathy or mass is identified. Thyroid gland and esophagus within normal limits. Small amount of secretions within the trachea. Lungs/Pleura: Persistent findings of mucoid impaction, most pronounced within the medial aspect of the right lower lobe with less prominent involvement in the left lower lobe and right middle lobe. Appearance  is similar in degree compared to the previous study. Lungs are otherwise clear. No pleural effusion or pneumothorax. Upper Abdomen: No acute abnormality. Musculoskeletal: No chest wall mass or suspicious bone lesions identified. 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 IMPRESSION: 1. Persistent findings of mucoid impaction, most pronounced within the medial aspect of the right lower lobe with less prominent involvement in the left lower lobe and right middle lobe. Appearance is similar in degree compared to the previous study. 2. Stable mildly enlarged pretracheal and lower right paratracheal lymph nodes, likely reactive. 3. Aortic and coronary artery atherosclerosis (ICD10-I70.0). Electronically Signed   By:  Duanne Guess D.O.   On: 01/11/2023 13:12   DG Chest 2 View  Result Date: 01/11/2023 CLINICAL DATA:  Cough and shortness of breath EXAM: CHEST - 2 VIEW COMPARISON:  Chest radiograph dated 09/16/2022, CTA chest dated 09/16/2022 FINDINGS: Normal lung volumes. Confluent right basilar opacity likely corresponding to previously noted mucoid impaction. No pleural effusion or pneumothorax. The heart size and mediastinal contours are within normal limits. No acute osseous abnormality. IMPRESSION: Confluent right basilar opacity likely corresponding to previously noted mucoid impaction. Electronically Signed   By: Agustin Cree M.D.   On: 01/11/2023 10:35   (Echo, Carotid, EGD, Colonoscopy, ERCP)    Subjective: Patient resting in bed anxious to go home  Discharge Exam: Vitals:   01/16/23 1049 01/16/23 1051  BP:    Pulse:    Resp:    Temp:    SpO2: (!) 89% 96%   Vitals:   01/16/23 0741 01/16/23 1004 01/16/23 1049 01/16/23 1051  BP:  (!) 149/75    Pulse:      Resp:      Temp:      TempSrc:      SpO2: 93%  (!) 89% 96%  Weight:      Height:        General: Pt is alert, awake, not in acute distress Cardiovascular: RRR, S1/S2 +, no rubs, no gallops Respiratory: rhonchi bilaterally, no wheezing, no rhonchi Abdominal: Soft, NT, ND, bowel sounds + Extremities: no edema, no cyanosis    The results of significant diagnostics from this hospitalization (including imaging, microbiology, ancillary and laboratory) are listed below for reference.     Microbiology: Recent Results (from the past 240 hour(s))  SARS Coronavirus 2 by RT PCR (hospital order, performed in [facility] hospital lab) *cepheid single result test* Anterior Nasal Swab     Status: None   Collection Time: 01/11/23  9:39 AM   Specimen: Anterior Nasal Swab  Result Value Ref Range Status   SARS Coronavirus 2 by RT PCR NEGATIVE NEGATIVE Final    Comment: (NOTE) SARS-CoV-2 target nucleic acids are NOT DETECTED.  The SARS-CoV-2  RNA is generally detectable in upper and lower respiratory specimens during the acute phase of infection. The lowest concentration of SARS-CoV-2 viral copies this assay can detect is 250 copies / mL. A negative result does not preclude SARS-CoV-2 infection and should not be used as the sole basis for treatment or other patient management decisions.  A negative result may occur with improper specimen collection / handling, submission of specimen other than nasopharyngeal swab, presence of viral mutation(s) within the areas targeted by this assay, and inadequate number of viral copies (<250 copies / mL). A negative result must be combined with clinical observations, patient history, and epidemiological information.  Fact Sheet for Patients:   RoadLapTop.co.za  Fact Sheet for Healthcare Providers: http://kim-miller.com/  This test is not yet approved or  cleared by the [place] and has been authorized for detection and/or diagnosis of SARS-CoV-2 by FDA under an Emergency Use Authorization (EUA).  This EUA will remain in effect (meaning this test can be used) for the duration of the COVID-19 declaration under Section 564(b)(1) of the Act, 21 U.S.C. section 360bbb-3(b)(1), unless the authorization is terminated or revoked sooner.  Performed at [facility], [address] [place], [place] [address]      Labs: BNP (last 3 results) Recent Labs    06/06/22 0616 09/16/22 1915 01/11/23 0938  BNP 11.3 11.2 18.9   Basic Metabolic Panel: Recent Labs  Lab 01/11/23 0938 01/11/23 1037 01/12/23 0515 01/13/23 0459 01/14/23 0532  NA 141 142 136 137 139  K 3.8 3.6 3.9 4.1 4.1  CL 102  --  100 101 104  CO2 31  --  27 26 28   GLUCOSE 102*  --  150* 154* 189*  BUN 18  --  25* 29* 23  CREATININE 1.27*  --  1.27* 1.27* 1.29*  CALCIUM 9.6  --  8.5* 8.4* 8.4*  MG 1.9  --   --   --   --   PHOS 3.2  --   --   --   --     Liver Function Tests: No results for input(s): "AST", "ALT", "ALKPHOS", "BILITOT", "PROT", "ALBUMIN" in the last 168 hours. No results for input(s): "LIPASE", "AMYLASE" in the last 168 hours. No results for input(s): "AMMONIA" in the last 168 hours. CBC: Recent Labs  Lab 01/11/23 0938 01/11/23 1037 01/12/23 0515 01/13/23 0459 01/14/23 0532  WBC 6.5  --  6.5 12.1* 11.2*  NEUTROABS 3.1  --   --   --   --   HGB 15.3 17.0 13.8 13.7 13.6  HCT 48.4 50.0 43.7 43.9 44.3  MCV 90.0  --  90.7 91.5 93.1  PLT 286  --  261 263 258   Cardiac Enzymes: No results for input(s): "CKTOTAL", "CKMB", "CKMBINDEX", "TROPONINI" in the last 168 hours. BNP: Invalid input(s): "POCBNP" CBG: No results for input(s): "GLUCAP" in the last 168 hours. D-Dimer No results for input(s): "DDIMER" in the last 72 hours. Hgb A1c No results for input(s): "HGBA1C" in the last 72 hours. Lipid Profile No results for input(s): "CHOL", "HDL", "LDLCALC", "TRIG", "CHOLHDL", "LDLDIRECT" in the last 72 hours. Thyroid function studies No results for input(s): "TSH", "T4TOTAL", "T3FREE", "THYROIDAB" in the last 72 hours.  Invalid input(s): "FREET3" Anemia work up No results for input(s): "VITAMINB12", "FOLATE", "FERRITIN", "TIBC", "IRON", "RETICCTPCT" in the last 72 hours. Urinalysis    Component Value Date/Time   COLORURINE YELLOW 02/26/2014 1459   APPEARANCEUR Clear 03/18/2022 1104   LABSPEC 1.025 02/26/2014 1459   PHURINE 6.0 02/26/2014 1459   GLUCOSEU Negative 03/18/2022 1104   HGBUR NEG 02/26/2014 1459   BILIRUBINUR Negative 03/18/2022 1104   KETONESUR NEG 02/26/2014 1459   PROTEINUR Trace 03/18/2022 1104   PROTEINUR NEG 02/26/2014 1459   UROBILINOGEN 0.2 02/26/2014 1459   NITRITE Negative 03/18/2022 1104   NITRITE NEG 02/26/2014 1459   LEUKOCYTESUR Negative 03/18/2022 1104   Sepsis Labs Recent Labs  Lab 01/11/23 0938 01/12/23 0515 01/13/23 0459 01/14/23 0532  WBC 6.5 6.5 12.1* 11.2*    Microbiology Recent Results (from the past 240 hour(s))  SARS Coronavirus 2 by RT PCR (hospital order, performed in [facility] hospital lab) *cepheid single result test* Anterior Nasal Swab     Status:  None   Collection Time: 01/11/23  9:39 AM   Specimen: Anterior Nasal Swab  Result Value Ref Range Status   SARS Coronavirus 2 by RT PCR NEGATIVE NEGATIVE Final    Comment: (NOTE) SARS-CoV-2 target nucleic acids are NOT DETECTED.  The SARS-CoV-2 RNA is generally detectable in upper and lower respiratory specimens during the acute phase of infection. The lowest concentration of SARS-CoV-2 viral copies this assay can detect is 250 copies / mL. A negative result does not preclude SARS-CoV-2 infection and should not be used as the sole basis for treatment or other patient management decisions.  A negative result may occur with improper specimen collection / handling, submission of specimen other than nasopharyngeal swab, presence of viral mutation(s) within the areas targeted by this assay, and inadequate number of viral copies (<250 copies / mL). A negative result must be combined with clinical observations, patient history, and epidemiological information.  Fact Sheet for Patients:   RoadLapTop.co.za  Fact Sheet for Healthcare Providers: http://kim-miller.com/  This test is not yet approved or  cleared by the [place] FDA and has been authorized for detection and/or diagnosis of SARS-CoV-2 by FDA under an Emergency Use Authorization (EUA).  This EUA will remain in effect (meaning this test can be used) for the duration of the COVID-19 declaration under Section 564(b)(1) of the Act, 21 U.S.C. section 360bbb-3(b)(1), unless the authorization is terminated or revoked sooner.  Performed at [facility], [address], [place], [place] [address]      Time coordinating discharge: 38 minutes  SIGNED: Alwyn Ren, MD  [facility] 01/17/2023, 4:03 PM

## 2023-01-30 ENCOUNTER — Encounter: Payer: Medicaid Other | Admitting: Internal Medicine

## 2023-02-08 ENCOUNTER — Inpatient Hospital Stay (HOSPITAL_BASED_OUTPATIENT_CLINIC_OR_DEPARTMENT_OTHER): Payer: Medicaid Other | Admitting: Pulmonary Disease

## 2023-02-10 ENCOUNTER — Other Ambulatory Visit: Payer: Self-pay

## 2023-02-10 ENCOUNTER — Ambulatory Visit (INDEPENDENT_AMBULATORY_CARE_PROVIDER_SITE_OTHER): Payer: Medicaid Other | Admitting: Student

## 2023-02-10 ENCOUNTER — Encounter: Payer: Self-pay | Admitting: Student

## 2023-02-10 DIAGNOSIS — G894 Chronic pain syndrome: Secondary | ICD-10-CM | POA: Diagnosis not present

## 2023-02-10 DIAGNOSIS — Z87891 Personal history of nicotine dependence: Secondary | ICD-10-CM

## 2023-02-10 DIAGNOSIS — J4489 Other specified chronic obstructive pulmonary disease: Secondary | ICD-10-CM

## 2023-02-10 DIAGNOSIS — J310 Chronic rhinitis: Secondary | ICD-10-CM

## 2023-02-10 MED ORDER — ACETAMINOPHEN 325 MG PO TABS
650.0000 mg | ORAL_TABLET | Freq: Four times a day (QID) | ORAL | Status: DC | PRN
Start: 2023-02-10 — End: 2023-02-10

## 2023-02-10 MED ORDER — BREZTRI AEROSPHERE 160-9-4.8 MCG/ACT IN AERO
2.0000 | INHALATION_SPRAY | Freq: Two times a day (BID) | RESPIRATORY_TRACT | 0 refills | Status: DC
Start: 2023-02-10 — End: 2023-06-12

## 2023-02-10 MED ORDER — ALBUTEROL SULFATE (2.5 MG/3ML) 0.083% IN NEBU
2.5000 mg | INHALATION_SOLUTION | Freq: Four times a day (QID) | RESPIRATORY_TRACT | 5 refills | Status: DC | PRN
Start: 2023-02-10 — End: 2023-10-13

## 2023-02-10 MED ORDER — BREZTRI AEROSPHERE 160-9-4.8 MCG/ACT IN AERO
2.0000 | INHALATION_SPRAY | Freq: Two times a day (BID) | RESPIRATORY_TRACT | 0 refills | Status: DC
Start: 2023-02-10 — End: 2023-02-10

## 2023-02-10 MED ORDER — GUAIFENESIN ER 600 MG PO TB12: 1200.0000 mg | ORAL_TABLET | Freq: Two times a day (BID) | ORAL | Status: DC

## 2023-02-10 MED ORDER — VENTOLIN HFA 108 (90 BASE) MCG/ACT IN AERS
2.0000 | INHALATION_SPRAY | Freq: Four times a day (QID) | RESPIRATORY_TRACT | 2 refills | Status: DC | PRN
Start: 2023-02-10 — End: 2023-06-12

## 2023-02-10 MED ORDER — PREGABALIN 100 MG PO CAPS: ORAL_CAPSULE | ORAL | 1 refills | Status: DC

## 2023-02-10 MED ORDER — ACETAMINOPHEN 325 MG PO TABS
650.0000 mg | ORAL_TABLET | Freq: Four times a day (QID) | ORAL | Status: AC | PRN
Start: 2023-02-10 — End: ?

## 2023-02-10 MED ORDER — FLUTICASONE PROPIONATE 50 MCG/ACT NA SUSP: 1.0000 | Freq: Every day | NASAL | 1 refills | Status: DC

## 2023-02-10 MED ORDER — GUAIFENESIN ER 600 MG PO TB12
1200.0000 mg | ORAL_TABLET | Freq: Two times a day (BID) | ORAL | Status: AC
Start: 2023-02-10 — End: ?

## 2023-02-10 MED ORDER — ALBUTEROL SULFATE (2.5 MG/3ML) 0.083% IN NEBU
2.5000 mg | INHALATION_SOLUTION | Freq: Four times a day (QID) | RESPIRATORY_TRACT | 5 refills | Status: DC | PRN
Start: 2023-02-10 — End: 2023-02-10

## 2023-02-10 MED ORDER — PREGABALIN 100 MG PO CAPS
ORAL_CAPSULE | ORAL | 1 refills | Status: DC
Start: 2023-02-10 — End: 2023-02-10

## 2023-02-10 MED ORDER — FLUTICASONE PROPIONATE 50 MCG/ACT NA SUSP
1.0000 | Freq: Every day | NASAL | 1 refills | Status: DC
Start: 2023-02-10 — End: 2023-10-13

## 2023-02-10 MED ORDER — VENTOLIN HFA 108 (90 BASE) MCG/ACT IN AERS
2.0000 | INHALATION_SPRAY | Freq: Four times a day (QID) | RESPIRATORY_TRACT | 2 refills | Status: DC | PRN
Start: 2023-02-10 — End: 2023-02-10

## 2023-02-10 NOTE — Patient Instructions (Signed)
Thank you, [name] for allowing [place] to provide your care today. Today we discussed COPD / Asthma.    I have ordered the following labs for you:  Lab Orders  No laboratory test(s) ordered today     Referrals ordered today:   Referral Orders  No referral(s) requested today     I have ordered the following medication/changed the following medications:   Stop the following medications: Medications Discontinued During This Encounter  Medication Reason   acetaminophen (TYLENOL) 325 MG tablet    acetaminophen (TYLENOL) 325 MG tablet    pregabalin (LYRICA) 100 MG capsule Reorder   fluticasone (FLONASE) 50 MCG/ACT nasal spray Reorder   VENTOLIN HFA 108 (90 Base) MCG/ACT inhaler Reorder   albuterol (PROVENTIL) (2.5 MG/3ML) 0.083% nebulizer solution Reorder   Budeson-Glycopyrrol-Formoterol (BREZTRI AEROSPHERE) 160-9-4.8 MCG/ACT AERO    guaiFENesin (MUCINEX) 600 MG 12 hr tablet Reorder     Start the following medications: Meds ordered this encounter  Medications   VENTOLIN HFA 108 (90 Base) MCG/ACT inhaler    Sig: Inhale 2 puffs into the lungs every 6 (six) hours as needed for wheezing or shortness of breath.    Dispense:  54 g    Refill:  2    **Patient requests 90 days supply**   albuterol (PROVENTIL) (2.5 MG/3ML) 0.083% nebulizer solution    Sig: Take 3 mLs (2.5 mg total) by nebulization every 6 (six) hours as needed for wheezing or shortness of breath.    Dispense:  360 mL    Refill:  5   pregabalin (LYRICA) 100 MG capsule    Sig: TAKE 1 CAPSULE(100 MG) BY MOUTH THREE TIMES DAILY    Dispense:  90 capsule    Refill:  1   guaiFENesin (MUCINEX) 600 MG 12 hr tablet    Sig: Take 2 tablets (1,200 mg total) by mouth 2 (two) times daily.   fluticasone (FLONASE) 50 MCG/ACT nasal spray    Sig: Place 1 spray into both nostrils daily.    Dispense:  9.9 mL    Refill:  1   acetaminophen (TYLENOL) 325 MG tablet    Sig: Take 2 tablets (650 mg total) by mouth every 6 (six) hours as  needed for mild pain (or Fever >/= 101).   Budeson-Glycopyrrol-Formoterol (BREZTRI AEROSPHERE) 160-9-4.8 MCG/ACT AERO    Sig: Inhale 2 puffs into the lungs in the morning and at bedtime.    Dispense:  10.7 g    Refill:  0     Follow up: 3 months   We look forward to seeing you next time. Please call our clinic at [phone] if you have any questions or concerns. The best time to call is Monday-Friday from 9am-4pm, but there is someone available 24/7. If after hours or the weekend, call the main hospital number and ask for the Internal Medicine Resident On-Call. If you need medication refills, please notify your pharmacy one week in advance and they will send [place] a request.   Thank you for trusting me with your care. Wishing you the best!  Lovie Macadamia MD [facility]

## 2023-02-10 NOTE — Assessment & Plan Note (Signed)
Patient is taking pregabalin as needed for pain with adequate control of his symptoms.  Patient was instructed to take acetaminophen as needed for pain as well at time of discharge.  This was reviewed with the patient.  Plan Refill pregabalin Refill acetaminophen, discussed with patient using Tylenol for pain management.

## 2023-02-10 NOTE — Progress Notes (Signed)
Subjective:  CC: Hospital follow-up  HPI:  [name] is a 62 y.o. male with a past medical history stated below and presents today for follow-up. Please see problem based assessment and plan for additional details.  Past Medical History:  Diagnosis Date   Allergic rhinitis 04/25/2012   Arthritis    Asthma, chronic 10/21/2010   Followed in Pulmonary clinic/ [facility]/ [place]   - HFA 50% 01/04/2011  > 90% 02/04/2011   - PFT's wnl 02/04/2011  - no intubation before  - has been hospitalized once for asthma in 2012 - quit smoking in 2012 - allergic to aspirin and pollen   10/11/2014>>Repeat pulmonary function tests. Performed for disability application FEV1/FVC=68.5%, FEV1 48%. Consistent with COPD, severe Please see results under media    Chronic pain syndrome 02/03/2011   Involving the shoulder joint  05/20/2013: right shoulder x ray >>Degenerative changes AC joint Seen by PT by 11/2013 >> improvement 03/13/2014 D/c from PT due to orange card being expired and pt cancelling appt    Complication of anesthesia    slow to wake up   COPD (chronic obstructive pulmonary disease) (HCC)    [place] 6/13, potentially due to ASA use.    Dyspnea    GERD (gastroesophageal reflux disease)    Hyperlipidemia    Hypertension    Obesity (BMI 30-39.9)    Pneumonia 09/2022   Substance abuse (HCC)    cocaine 35 years ago    Current Outpatient Medications on File Prior to Visit  Medication Sig Dispense Refill   atorvastatin (LIPITOR) 40 MG tablet Take 1 tablet (40 mg total) by mouth daily. 90 tablet 3   azelastine (ASTELIN) 0.1 % nasal spray Place 1 spray into both nostrils 2 (two) times daily. Use in each nostril as directed 30 mL 12   loratadine (CLARITIN) 10 MG tablet Take 1 tablet (10 mg total) by mouth daily.     losartan (COZAAR) 100 MG tablet Take 1 tablet (100 mg total) by mouth daily. 90 tablet 3   montelukast (SINGULAIR) 10 MG tablet Take 1 tablet (10 mg total) by mouth at bedtime. 30  tablet 2   polyethylene glycol (MIRALAX / GLYCOLAX) 17 g packet Take 17 g by mouth daily as needed for mild constipation. 14 each 0   predniSONE (DELTASONE) 10 MG tablet Take 4 tablets daily for 3 days 6/18 to 6/20 Take 3 tablets daily for the next 3 days 6/21 to 6/23 Take 2 tablets daily for the following 3 days 6/24 to 6/26 1 tablet daily till done 45 tablet 0   tamsulosin (FLOMAX) 0.4 MG CAPS capsule TAKE 1 CAPSULE(0.4 MG) BY MOUTH DAILY (Patient taking differently: Take 0.4 mg by mouth daily.) 30 capsule 2   No current facility-administered medications on file prior to visit.    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  Tobacco Use   Smoking status: Former    Current packs/day: 0.00    Average packs/day: 1 pack/day for 40.0 years (40.0 ttl pk-yrs)  Types: Cigarettes    Start date: 10/30/1969    Quit date: 10/30/2009    Years since quitting: 13.2   Smokeless tobacco: Never  Vaping Use   Vaping status: Never Used  Substance and Sexual Activity   Alcohol use: Yes    Alcohol/week: 2.0 standard drinks of alcohol    Types: 2 Cans of beer per week    Comment: Beer on wkends   Drug use: No    Comment: former cocaine use 35 years ago   Sexual activity: Not on file    Comment: unemployed, was a Investment banker, operational  Other Topics Concern   Not on file  Social History Narrative   Not on file   Social Determinants of Health   Financial Resource Strain: Low Risk  (09/27/2022)   Overall Financial Resource Strain (CARDIA)    Difficulty of Paying Living Expenses: Not hard at all  Food Insecurity: No Food Insecurity (01/11/2023)   Hunger  Vital Sign    Worried About Running Out of Food in the Last Year: Never true    Ran Out of Food in the Last Year: Never true  Transportation Needs: No Transportation Needs (01/11/2023)   PRAPARE - Administrator, Civil Service (Medical): No    Lack of Transportation (Non-Medical): No  Physical Activity: Insufficiently Active (09/27/2022)   Exercise Vital Sign    Days of Exercise per Week: 7 days    Minutes of Exercise per Session: 10 min  Stress: No Stress Concern Present (09/27/2022)   [employer] of Occupational Health - Occupational Stress Questionnaire    Feeling of Stress : Not at all  Social Connections: Socially Isolated (09/27/2022)   Social Connection and Isolation Panel [NHANES]    Frequency of Communication with Friends and Family: More than three times a week    Frequency of Social Gatherings with Friends and Family: Twice a week    Attends Religious Services: Never    Database administrator or Organizations: No    Attends Banker Meetings: Never    Marital Status: Never married  Intimate Partner Violence: Not At Risk (01/11/2023)   Humiliation, Afraid, Rape, and Kick questionnaire    Fear of Current or Ex-Partner: No    Emotionally Abused: No    Physically Abused: No    Sexually Abused: No    Review of Systems: ROS negative except for what is noted on the assessment and plan.  Objective:   Vitals:   02/10/23 0928  BP: 130/68  Pulse: 74  Temp: 98.3 F (36.8 C)  TempSrc: Oral  SpO2: 96%  Weight: 247 lb 12.8 oz (112.4 kg)  Height: 5\' 11"  (1.803 m)    Physical Exam: Constitutional: well-appearing  in no acute distress HENT: normocephalic atraumatic, mucous membranes moist Eyes: conjunctiva non-erythematous Neck: supple Cardiovascular: regular rate and rhythm, no m/r/g Pulmonary/Chest: normal work of breathing on room air, lungs clear to auscultation bilaterally Abdominal: soft, non-tender, non-distended MSK: normal bulk and  tone Skin: warm and dry Psych: Normal mood and affect   Assessment & Plan:  COPD with asthma (HCC) Patient following up from hospital discharge on 01/16/2023, length of stay 5 days, for acute respiratory failure in the setting of asthma and COPD.  Patient denies shortness of breath or wheezing at rest.  He endorses shortness of breath and wheezing during exertion especially when he is outside in the heat.  He has been taking Ventolin as needed when walking around outside.  He takes his  albuterol usually once daily at night.  He has not received his Breztri inhaler, and instead has been taking Symbicort daily 2 puffs before bed.  Plan: Refilled medications, inhalers Reviewed prescriptions, encouraged medication compliance. Confirmed upcoming pulmonology appointment on 02/14/2023  Chronic pain syndrome Patient is taking pregabalin as needed for pain with adequate control of his symptoms.  Patient was instructed to take acetaminophen as needed for pain as well at time of discharge.  This was reviewed with the patient.  Plan Refill pregabalin Refill acetaminophen, discussed with patient using Tylenol for pain management.    Patient seen with Dr. Heide Scales MD [facility]  PGY-1 Pager: [phone]  Phone: [phone] Date 02/10/2023  Time 4:54 PM

## 2023-02-10 NOTE — Assessment & Plan Note (Signed)
Patient following up from hospital discharge on 01/16/2023, length of stay 5 days, for acute respiratory failure in the setting of asthma and COPD.  Patient denies shortness of breath or wheezing at rest.  He endorses shortness of breath and wheezing during exertion especially when he is outside in the heat.  He has been taking Ventolin as needed when walking around outside.  He takes his albuterol usually once daily at night.  He has not received his Breztri inhaler, and instead has been taking Symbicort daily 2 puffs before bed.  Plan: Refilled medications, inhalers Reviewed prescriptions, encouraged medication compliance. Confirmed upcoming pulmonology appointment on 02/14/2023

## 2023-02-14 ENCOUNTER — Inpatient Hospital Stay (HOSPITAL_BASED_OUTPATIENT_CLINIC_OR_DEPARTMENT_OTHER): Payer: Medicaid Other | Admitting: Pulmonary Disease

## 2023-02-14 NOTE — Addendum Note (Signed)
Addended by: [name] on: 02/14/2023 08:57 AM   Modules accepted: Level of Service

## 2023-02-14 NOTE — Progress Notes (Signed)
Internal Medicine Clinic Attending  I was physically present during the key portions of the resident provided service and participated in the medical decision making of patient's management care. I reviewed pertinent patient test results.  The assessment, diagnosis, and plan were formulated together and I agree with the documentation in the resident's note.  Narendra, Nischal, MD  

## 2023-03-20 ENCOUNTER — Ambulatory Visit (INDEPENDENT_AMBULATORY_CARE_PROVIDER_SITE_OTHER): Payer: Medicaid Other | Admitting: Student

## 2023-03-20 ENCOUNTER — Encounter: Payer: Self-pay | Admitting: Student

## 2023-03-20 DIAGNOSIS — N401 Enlarged prostate with lower urinary tract symptoms: Secondary | ICD-10-CM | POA: Diagnosis not present

## 2023-03-20 DIAGNOSIS — R3916 Straining to void: Secondary | ICD-10-CM | POA: Diagnosis not present

## 2023-03-20 DIAGNOSIS — R32 Unspecified urinary incontinence: Secondary | ICD-10-CM | POA: Diagnosis not present

## 2023-03-20 NOTE — Assessment & Plan Note (Addendum)
Patient with a well documented history of urinary incontinence and BPH after a pelvic fracture and bladder injuri in 2015. He is currently on treatment for BPH with Tamsulosin. Was previously seen by urology. No dysuria currently. No hematuria. Mild straining, but this has been stable. Has not needed to self cath; feels like he is able to empty his bladder completely.   Offered a referral to urology to re-establish care since his previous urologist retired, but patient declined. Will submit DME referral for Incontinence supplies. Prefers pull up and he is a XL size. -Paperwork completed

## 2023-03-20 NOTE — Progress Notes (Signed)
  [facility] Internal [facility] Residency Telephone Encounter Continuity Care Appointment  HPI:  This telephone encounter was created for [name] on 03/20/2023 for the following purpose/cc BPH and urinary incontinence.   Past Medical History:  Past Medical History:  Diagnosis Date   Allergic rhinitis 04/25/2012   Arthritis    Asthma, chronic 10/21/2010   Followed in Pulmonary clinic/ [facility]/ [place]   - HFA 50% 01/04/2011  > 90% 02/04/2011   - PFT's wnl 02/04/2011  - no intubation before  - has been hospitalized once for asthma in 2012 - quit smoking in 2012 - allergic to aspirin and pollen   10/11/2014>>Repeat pulmonary function tests. Performed for disability application FEV1/FVC=68.5%, FEV1 48%. Consistent with COPD, severe Please see results under media    Chronic pain syndrome 02/03/2011   Involving the shoulder joint  05/20/2013: right shoulder x ray >>Degenerative changes AC joint Seen by PT by 11/2013 >> improvement 03/13/2014 D/c from PT due to orange card being expired and pt cancelling appt    Complication of anesthesia    slow to wake up   COPD (chronic obstructive pulmonary disease) (HCC)    [place] 6/13, potentially due to ASA use.    Dyspnea    GERD (gastroesophageal reflux disease)    Hyperlipidemia    Hypertension    Obesity (BMI 30-39.9)    Pneumonia 09/2022   Substance abuse (HCC)    cocaine 35 years ago     ROS:     Assessment / Plan / Recommendations:  Urinary incontinence Patient with a well documented history of urinary incontinence and BPH after a pelvic fracture and bladder injuri in 2015. He is currently on treatment for BPH with Tamsulosin. Was previously seen by urology. No dysuria currently. No hematuria. Mild straining, but this has been stable. Has not needed to self cath; feels like he is able to empty his bladder completely.   Offered a referral to urology to re-establish care since his previous urologist retired, but patient declined. Will  submit DME referral for Incontinence supplies. Prefers pull up and he is a XL size. -Paperwork completed     As always, pt is advised that if symptoms worsen or new symptoms arise, they should go to an urgent care facility or to to ER for further evaluation.   Consent and Medical Decision Making:  Patient discussed with Dr. Cleda Daub This is a telephone encounter between [name] and Morene Crocker ,MD on 03/20/2023 for incontinence supplies for urinary incontinence. The visit was conducted with the patient located at home and Morene Crocker ,MD at [facility]. The patient's identity was confirmed using their DOB and current address. The patient has consented to being evaluated through a telephone encounter and understands the associated risks (an examination cannot be done and the patient may need to come in for an appointment) / benefits (allows the patient to remain at home, decreasing exposure to coronavirus). I personally spent 20 minutes on medical discussion.     Given history of bladder injury in setting of pelvic fracture, will benefit from urology evaluation.

## 2023-03-22 NOTE — Progress Notes (Signed)
Internal Medicine Clinic Attending  Case discussed with the resident at the time of the visit.  We reviewed the resident's history and exam and pertinent patient test results.  I agree with the assessment, diagnosis, and plan of care documented in the resident's note.  

## 2023-03-23 DIAGNOSIS — J449 Chronic obstructive pulmonary disease, unspecified: Secondary | ICD-10-CM | POA: Diagnosis not present

## 2023-03-23 DIAGNOSIS — R32 Unspecified urinary incontinence: Secondary | ICD-10-CM | POA: Diagnosis not present

## 2023-03-23 DIAGNOSIS — I10 Essential (primary) hypertension: Secondary | ICD-10-CM | POA: Diagnosis not present

## 2023-06-11 DIAGNOSIS — J449 Chronic obstructive pulmonary disease, unspecified: Secondary | ICD-10-CM | POA: Diagnosis not present

## 2023-06-11 DIAGNOSIS — R32 Unspecified urinary incontinence: Secondary | ICD-10-CM | POA: Diagnosis not present

## 2023-06-11 DIAGNOSIS — I10 Essential (primary) hypertension: Secondary | ICD-10-CM | POA: Diagnosis not present

## 2023-06-12 ENCOUNTER — Other Ambulatory Visit: Payer: Self-pay

## 2023-06-12 ENCOUNTER — Ambulatory Visit: Payer: Medicaid Other | Admitting: Student

## 2023-06-12 ENCOUNTER — Encounter: Payer: Self-pay | Admitting: Student

## 2023-06-12 VITALS — BP 137/87 | HR 68 | Temp 97.7°F | Ht 71.0 in | Wt 245.0 lb

## 2023-06-12 DIAGNOSIS — R339 Retention of urine, unspecified: Secondary | ICD-10-CM

## 2023-06-12 DIAGNOSIS — R200 Anesthesia of skin: Secondary | ICD-10-CM | POA: Insufficient documentation

## 2023-06-12 DIAGNOSIS — J4489 Other specified chronic obstructive pulmonary disease: Secondary | ICD-10-CM | POA: Diagnosis not present

## 2023-06-12 DIAGNOSIS — E7800 Pure hypercholesterolemia, unspecified: Secondary | ICD-10-CM

## 2023-06-12 DIAGNOSIS — Z0000 Encounter for general adult medical examination without abnormal findings: Secondary | ICD-10-CM

## 2023-06-12 DIAGNOSIS — Z87891 Personal history of nicotine dependence: Secondary | ICD-10-CM | POA: Diagnosis not present

## 2023-06-12 DIAGNOSIS — G4733 Obstructive sleep apnea (adult) (pediatric): Secondary | ICD-10-CM | POA: Diagnosis not present

## 2023-06-12 DIAGNOSIS — I10 Essential (primary) hypertension: Secondary | ICD-10-CM

## 2023-06-12 DIAGNOSIS — N400 Enlarged prostate without lower urinary tract symptoms: Secondary | ICD-10-CM

## 2023-06-12 DIAGNOSIS — R739 Hyperglycemia, unspecified: Secondary | ICD-10-CM | POA: Diagnosis not present

## 2023-06-12 DIAGNOSIS — G894 Chronic pain syndrome: Secondary | ICD-10-CM

## 2023-06-12 MED ORDER — MONTELUKAST SODIUM 10 MG PO TABS
10.0000 mg | ORAL_TABLET | Freq: Every day | ORAL | 2 refills | Status: DC
Start: 2023-06-12 — End: 2024-01-22

## 2023-06-12 MED ORDER — TAMSULOSIN HCL 0.4 MG PO CAPS: ORAL_CAPSULE | ORAL | 2 refills | Status: DC

## 2023-06-12 MED ORDER — PREGABALIN 100 MG PO CAPS
ORAL_CAPSULE | ORAL | 1 refills | Status: AC
Start: 2023-06-12 — End: ?

## 2023-06-12 MED ORDER — TAMSULOSIN HCL 0.4 MG PO CAPS
ORAL_CAPSULE | ORAL | 2 refills | Status: DC
Start: 2023-06-12 — End: 2023-10-13

## 2023-06-12 MED ORDER — VENTOLIN HFA 108 (90 BASE) MCG/ACT IN AERS
2.0000 | INHALATION_SPRAY | Freq: Four times a day (QID) | RESPIRATORY_TRACT | 2 refills | Status: DC | PRN
Start: 2023-06-12 — End: 2023-10-13

## 2023-06-12 MED ORDER — BREZTRI AEROSPHERE 160-9-4.8 MCG/ACT IN AERO: 2.0000 | INHALATION_SPRAY | Freq: Two times a day (BID) | RESPIRATORY_TRACT | 0 refills | Status: DC

## 2023-06-12 NOTE — Assessment & Plan Note (Signed)
Patient notes numbness in left arm near ulnar nerve in 4th and 5th digits.  He says this started about 1.5 months ago and the numbness starts at his left shoulder.  He denies any radiation of pain.  He says no movements make it worse but he feels like his fourth and fifth digits are weaker.  He denies any pain but feels like it has worsened over the month.  On physical examination, his fourth and fifth digits on his left hand are weaker than his right.  Patient was counseled on avoiding prolonged elbow flexion and repetitive elbow movements. - Begin nocturnal elbow splinting

## 2023-06-12 NOTE — Assessment & Plan Note (Signed)
Last lipid panel one year ago. LDL was 67. 10 year ASCVD is 13.7. Currently on atorvastatin 40 mg daily.  Will follow-up with lipid panel to check on these values. -Follow-up lipid panel

## 2023-06-12 NOTE — Assessment & Plan Note (Signed)
Patient noted to have had elevated glucose at previous visits.  5 months ago, it was 150.  6 months ago, it was 215.  His last A1c was 12 years ago and was 6.1. - Follow-up A1c

## 2023-06-12 NOTE — Assessment & Plan Note (Signed)
>>  ASSESSMENT AND PLAN FOR HYPERGLYCEMIA WRITTEN ON 06/12/2023  1:15 PM BY STEPHANIE FREUND, MD  Patient noted to have had elevated glucose at previous visits.  5 months ago, it was 150.  6 months ago, it was 215.  His last A1c was 12 years ago and was 6.1. - Follow-up A1c

## 2023-06-12 NOTE — Assessment & Plan Note (Signed)
Noted to have had a positive FOBT in August 2023.  Patient initially reluctant for colonoscopy but after counseling on positive finding from FOBT, patient is amendable for colonoscopy referral or further workup.  Denies any gross blood in stools or any other signs or symptoms. - Colonoscopy referral

## 2023-06-12 NOTE — Assessment & Plan Note (Signed)
He endorses coughing attacks at night and waking up short of breath.  He does not have to prop up himself with pillows or sleep in a recliner.  Patient is euvolemic.  With STOP-BANG score of 4, there is concern for sleep apnea so we will follow-up with a sleep study. - Follow-up sleep study

## 2023-06-12 NOTE — Assessment & Plan Note (Signed)
Currently on losartan 100 mg daily. Elevated at 160/78 today but recheck was 137/87.  Patient notes that he has not been taking his blood pressure at home so he was counseled on beginning measuring this a few times a week with a blood pressure cuff.  He notes good adherence denies any symptoms. - Continue losartan 100 mg daily - Follow-up BMP

## 2023-06-12 NOTE — Patient Instructions (Signed)
Thank you so much for coming to the clinic today!   I have sent in referrals for a sleep study and for colonoscopy for you to schedule in the coming months.  For your arm numbness, please wear your elbow splint at night and avoid prolonged elbow flexion and repetitive elbow movements.  I have also sent in refills and will follow-up with the labs.  If you have any questions please feel free to the call the clinic at anytime at [phone]. It was a pleasure seeing you!  Best, Dr. Rayvon Char

## 2023-06-12 NOTE — Assessment & Plan Note (Signed)
Currently on albuterol, Breztri, and montelukast.  Patient notes that he never got his Markus Daft so we will reorder this.  He does have some wheezing on physical examination which is likely from not having his Breztri.  Otherwise, patient has no changes since last visit. - Continue Breztri, montelukast, and Ventolin

## 2023-06-12 NOTE — Progress Notes (Signed)
CC: Medication follow-up  HPI: [name] is a 62 y.o. male living with a history stated below and presents today for medication follow-up. Please see problem based assessment and plan for additional details.  Past Medical History:  Diagnosis Date   Allergic rhinitis 04/25/2012   Arthritis    Asthma, chronic 10/21/2010   Followed in Pulmonary clinic/ [facility]/ [place]   - HFA 50% 01/04/2011  > 90% 02/04/2011   - PFT's wnl 02/04/2011  - no intubation before  - has been hospitalized once for asthma in 2012 - quit smoking in 2012 - allergic to aspirin and pollen   10/11/2014>>Repeat pulmonary function tests. Performed for disability application FEV1/FVC=68.5%, FEV1 48%. Consistent with COPD, severe Please see results under media    Chronic pain syndrome 02/03/2011   Involving the shoulder joint  05/20/2013: right shoulder x ray >>Degenerative changes AC joint Seen by PT by 11/2013 >> improvement 03/13/2014 D/c from PT due to orange card being expired and pt cancelling appt    Complication of anesthesia    slow to wake up   COPD (chronic obstructive pulmonary disease) (HCC)    exas 6/13, potentially due to ASA use.    Dyspnea    GERD (gastroesophageal reflux disease)    Hyperlipidemia    Hypertension    Obesity (BMI 30-39.9)    Pneumonia 09/2022   Substance abuse (HCC)    cocaine 35 years ago    Current Outpatient Medications on File Prior to Visit  Medication Sig Dispense Refill   acetaminophen (TYLENOL) 325 MG tablet Take 2 tablets (650 mg total) by mouth every 6 (six) hours as needed for mild pain (or Fever >/= 101).     albuterol (PROVENTIL) (2.5 MG/3ML) 0.083% nebulizer solution Take 3 mLs (2.5 mg total) by nebulization every 6 (six) hours as needed for wheezing or shortness of breath. 360 mL 5   atorvastatin (LIPITOR) 40 MG tablet Take 1 tablet (40 mg total) by mouth daily. 90 tablet 3   azelastine (ASTELIN) 0.1 % nasal spray Place 1 spray into both nostrils 2 (two) times  daily. Use in each nostril as directed 30 mL 12   fluticasone (FLONASE) 50 MCG/ACT nasal spray Place 1 spray into both nostrils daily. 9.9 mL 1   guaiFENesin (MUCINEX) 600 MG 12 hr tablet Take 2 tablets (1,200 mg total) by mouth 2 (two) times daily.     loratadine (CLARITIN) 10 MG tablet Take 1 tablet (10 mg total) by mouth daily.     losartan (COZAAR) 100 MG tablet Take 1 tablet (100 mg total) by mouth daily. 90 tablet 3   polyethylene glycol (MIRALAX / GLYCOLAX) 17 g packet Take 17 g by mouth daily as needed for mild constipation. 14 each 0   predniSONE (DELTASONE) 10 MG tablet Take 4 tablets daily for 3 days 6/18 to 6/20 Take 3 tablets daily for the next 3 days 6/21 to 6/23 Take 2 tablets daily for the following 3 days 6/24 to 6/26 1 tablet daily till done 45 tablet 0   No current facility-administered medications on file prior to visit.    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  Tobacco Use   Smoking status: Former    Current packs/day: 0.00    Average packs/day: 1 pack/day for 40.0 years (40.0 ttl pk-yrs)    Types: Cigarettes    Start date: 10/30/1969    Quit date: 10/30/2009    Years since quitting: 13.6   Smokeless tobacco: Never  Vaping Use   Vaping status: Never Used  Substance and Sexual Activity   Alcohol use: Yes    Alcohol/week: 2.0 standard drinks of alcohol    Types: 2 Cans of beer per week    Comment: Beer on wkends   Drug use: No    Comment: former cocaine use 35 years ago   Sexual activity: Not on file    Comment: unemployed, was a Investment banker, operational  Other Topics Concern   Not on file  Social History  Narrative   Not on file   Social Determinants of Health   Financial Resource Strain: Low Risk  (09/27/2022)   Overall Financial Resource Strain (CARDIA)    Difficulty of Paying Living Expenses: Not hard at all  Food Insecurity: No Food Insecurity (06/12/2023)   Hunger Vital Sign    Worried About Running Out of Food in the Last Year: Never true    Ran Out of Food in the Last Year: Never true  Transportation Needs: No Transportation Needs (06/12/2023)   PRAPARE - Administrator, Civil Service (Medical): No    Lack of Transportation (Non-Medical): No  Physical Activity: Insufficiently Active (09/27/2022)   Exercise Vital Sign    Days of Exercise per Week: 7 days    Minutes of Exercise per Session: 10 min  Stress: No Stress Concern Present (09/27/2022)   [employer] of Occupational Health - Occupational Stress Questionnaire    Feeling of Stress : Not at all  Social Connections: Socially Isolated (06/12/2023)   Social Connection and Isolation Panel [NHANES]    Frequency of Communication with Friends and Family: Three times a week    Frequency of Social Gatherings with Friends and Family: Twice a week    Attends Religious Services: Never    Database administrator or Organizations: No    Attends Banker Meetings: Never    Marital Status: Never married  Intimate Partner Violence: Not At Risk (06/12/2023)   Humiliation, Afraid, Rape, and Kick questionnaire    Fear of Current or Ex-Partner: No    Emotionally Abused: No    Physically Abused: No    Sexually Abused: No    Review of Systems: ROS negative except for what is noted on the assessment and plan.  Vitals:   06/12/23 0844 06/12/23 0851  BP: (!) 160/78 137/87  Pulse: 73 68  Temp: 97.7 F (36.5 C)   TempSrc: Oral   SpO2: 96%   Weight: 245 lb (111.1 kg)   Height: 5\' 11"  (1.803 m)     Physical Exam: Constitutional: well-appearing in no acute distress HENT: normocephalic atraumatic, mucous  membranes moist Eyes: conjunctiva non-erythematous Neck: supple Cardiovascular: regular rate and rhythm, no m/r/g Pulmonary/Chest: normal work of breathing on room air, bilateral wheezing Abdominal: soft, non-tender, non-distended MSK: normal bulk and tone Neurological: alert & oriented x 3; weakness in fourth and fifth digits and left hand Skin: warm and dry   Assessment & Plan:   Essential hypertension, benign Currently on losartan 100 mg daily. Elevated at  160/78 today but recheck was 137/87.  Patient notes that he has not been taking his blood pressure at home so he was counseled on beginning measuring this a few times a week with a blood pressure cuff.  He notes good adherence denies any symptoms. - Continue losartan 100 mg daily - Follow-up BMP  COPD with asthma (HCC) Currently on albuterol, Breztri, and montelukast.  Patient notes that he never got his Markus Daft so we will reorder this.  He does have some wheezing on physical examination which is likely from not having his Breztri.  Otherwise, patient has no changes since last visit. - Continue Breztri, montelukast, and Ventolin  OSA (obstructive sleep apnea) He endorses coughing attacks at night and waking up short of breath.  He does not have to prop up himself with pillows or sleep in a recliner.  Patient is euvolemic.  With STOP-BANG score of 4, there is concern for sleep apnea so we will follow-up with a sleep study. - Follow-up sleep study  Healthcare maintenance Noted to have had a positive FOBT in August 2023.  Patient initially reluctant for colonoscopy but after counseling on positive finding from FOBT, patient is amendable for colonoscopy referral or further workup.  Denies any gross blood in stools or any other signs or symptoms. - Colonoscopy referral  Hyperglycemia Patient noted to have had elevated glucose at previous visits.  5 months ago, it was 150.  6 months ago, it was 215.  His last A1c was 12 years ago and  was 6.1. - Follow-up A1c  Hypercholesteremia Last lipid panel one year ago. LDL was 67. 10 year ASCVD is 13.7. Currently on atorvastatin 40 mg daily.  Will follow-up with lipid panel to check on these values. -Follow-up lipid panel  Arm numbness left Patient notes numbness in left arm near ulnar nerve in 4th and 5th digits.  He says this started about 1.5 months ago and the numbness starts at his left shoulder.  He denies any radiation of pain.  He says no movements make it worse but he feels like his fourth and fifth digits are weaker.  He denies any pain but feels like it has worsened over the month.  On physical examination, his fourth and fifth digits on his left hand are weaker than his right.  Patient was counseled on avoiding prolonged elbow flexion and repetitive elbow movements. - Begin nocturnal elbow splinting  Patient seen with Dr. Henderson Newcomer, MD  [facility] Internal Medicine, PGY-1 Date 06/12/2023 Time 1:17 PM

## 2023-06-13 LAB — BMP8+ANION GAP
Anion Gap: 14 mmol/L (ref 10.0–18.0)
BUN/Creatinine Ratio: 13 (ref 10–24)
BUN: 14 mg/dL (ref 8–27)
CO2: 27 mmol/L (ref 20–29)
Calcium: 9.6 mg/dL (ref 8.6–10.2)
Chloride: 101 mmol/L (ref 96–106)
Creatinine, Ser: 1.07 mg/dL (ref 0.76–1.27)
Glucose: 82 mg/dL (ref 70–99)
Potassium: 4.2 mmol/L (ref 3.5–5.2)
Sodium: 142 mmol/L (ref 134–144)
eGFR: 78 mL/min/{1.73_m2} (ref >59)

## 2023-06-13 LAB — LIPID PANEL
Chol/HDL Ratio: 2.7 ratio (ref 0.0–5.0)
Cholesterol, Total: 115 mg/dL (ref 100–199)
HDL: 43 mg/dL (ref >39)
LDL Chol Calc (NIH): 59 mg/dL (ref 0–99)
Triglycerides: 58 mg/dL (ref 0–149)
VLDL Cholesterol Cal: 13 mg/dL (ref 5–40)

## 2023-06-13 LAB — HEMOGLOBIN A1C
Est. average glucose Bld gHb Est-mCnc: 157 mg/dL
Hgb A1c MFr Bld: 7.1 % — ABNORMAL HIGH (ref 4.8–5.6)

## 2023-06-14 ENCOUNTER — Telehealth: Payer: Self-pay

## 2023-06-14 NOTE — Telephone Encounter (Signed)
Prior Authorization for patient [name] [employer]) came through on cover my meds was submitted with last office notes awaiting approval or denial.  [id number]

## 2023-06-14 NOTE — Telephone Encounter (Signed)
Decision:Approved [name] (Key: [id number]) PA Case ID #: [id number] Need Help? Call us at [phone] Outcome Approved today by [facility] PA Case: [id number], Status: Approved, Coverage Starts on: 06/14/2023 12:00:00 AM, Coverage Ends on: 06/13/2024 12:00:00 AM. Authorization Expiration Date: 06/12/2024 Drug Breztri [employer] 160-9-4.8MCG/ACT aerosol ePA cloud logo Form [facility] [place] [employer] [place] Form [phone] NCPDP)

## 2023-06-14 NOTE — Progress Notes (Signed)
Called patient to discuss lab results.  A1c elevated at 7.1.  At this time, after discussing with the patient, we will attempt to manage this with dietary modifications for the next 3 months.  At his follow-up in 3 months, if his A1c does not improve, we will  begin metformin.  Also discussed Breztri with patient.  After talking with pharmacy, it appears that this will need a prior authorization so we will begin this process.  Discussed this with the patient as well and he is understanding.

## 2023-06-14 NOTE — Progress Notes (Signed)
 Internal Medicine Clinic Attending  I was physically present during the key portions of the resident provided service and participated in the medical decision making of patient's management care. I reviewed pertinent patient test results.  The assessment, diagnosis, and plan were formulated together and I agree with the documentation in the resident's note.  Inez Catalina, MD

## 2023-08-30 ENCOUNTER — Other Ambulatory Visit: Payer: Self-pay | Admitting: Student

## 2023-08-30 DIAGNOSIS — J4489 Other specified chronic obstructive pulmonary disease: Secondary | ICD-10-CM

## 2023-10-05 DIAGNOSIS — J449 Chronic obstructive pulmonary disease, unspecified: Secondary | ICD-10-CM | POA: Diagnosis not present

## 2023-10-05 DIAGNOSIS — I10 Essential (primary) hypertension: Secondary | ICD-10-CM | POA: Diagnosis not present

## 2023-10-05 DIAGNOSIS — R32 Unspecified urinary incontinence: Secondary | ICD-10-CM | POA: Diagnosis not present

## 2023-10-13 ENCOUNTER — Other Ambulatory Visit: Payer: Self-pay | Admitting: Student

## 2023-10-13 ENCOUNTER — Telehealth: Payer: Self-pay | Admitting: *Deleted

## 2023-10-13 DIAGNOSIS — J4489 Other specified chronic obstructive pulmonary disease: Secondary | ICD-10-CM

## 2023-10-13 DIAGNOSIS — R339 Retention of urine, unspecified: Secondary | ICD-10-CM

## 2023-10-13 DIAGNOSIS — E7800 Pure hypercholesterolemia, unspecified: Secondary | ICD-10-CM

## 2023-10-13 DIAGNOSIS — J310 Chronic rhinitis: Secondary | ICD-10-CM

## 2023-10-13 DIAGNOSIS — N400 Enlarged prostate without lower urinary tract symptoms: Secondary | ICD-10-CM

## 2023-10-13 NOTE — Telephone Encounter (Signed)
 I called pt - stated not the Gabapentin but the Pregabalin. Stated the Pregabalin is not helping his pain. Offered pt a telehealth appt this afternoon - stated he does not have the money and wants to keep his appt already scheduled on 11/08/23. Stated he takes the Pregabalin 100 mg 3 times a day.

## 2023-10-13 NOTE — Telephone Encounter (Signed)
 Copied from CRM [id number]. Topic: Clinical - Medication Refill >> Oct 13, 2023 11:17 AM Corin V wrote: Most Recent Primary Care Visit:  Provider: Morrie Sheldon  Department: [facility]  Visit Type: OPEN ESTABLISHED  Date: 06/12/2023  Medication: atorvastatin (LIPITOR) 40 MG tablet albuterol (PROVENTIL) (2.5 MG/3ML) 0.083% nebulizer solution VENTOLIN HFA 108 (90 Base) MCG/ACT inhaler BREZTRI AEROSPHERE 160-9-4.8 MCG/ACT AERO  Has the patient contacted their pharmacy? Yes (Agent: If no, request that the patient contact the pharmacy for the refill. If patient does not wish to contact the pharmacy document the reason why and proceed with request.) (Agent: If yes, when and what did the pharmacy advise?)  Is this the correct pharmacy for this prescription? Yes If no, delete pharmacy and type the correct one.  This is the patient's preferred pharmacy:  [facility] [phone] - [place], [place] - [address] AT [facility] [address] [place] [address] Phone: [phone] Fax: [phone]   Has the prescription been filled recently? No  Is the patient out of the medication? Yes  Has the patient been seen for an appointment in the last year OR does the patient have an upcoming appointment? Yes  Can we respond through MyChart? No- Call [phone]  Agent: Please be advised that Rx refills may take up to 3 business days. We ask that you follow-up with your pharmacy.

## 2023-10-13 NOTE — Telephone Encounter (Signed)
 Last Fill:Atorvastatin:     Albuterol: 02/10/23    Ventolin: 06/12/23    Breztri: 08/30/23    Astelin: 01/15/23    Flonase: 02/10/23    Tamsulosin: 06/12/23  Last OV: 06/12/23 Next OV: 11/08/23  Routing to provider for review/authorization.

## 2023-10-13 NOTE — Telephone Encounter (Signed)
 Copied from CRM [id number]. Topic: Clinical - Prescription Issue >> Oct 13, 2023 11:20 AM Corin V wrote: Reason for CRM: Patient stated that he does not feel that his gabapentin is working for him the way it is being prescribed. He didn't know if there were any changes or alternatives that could be tried before his appointment next month.

## 2023-10-16 MED ORDER — BREZTRI AEROSPHERE 160-9-4.8 MCG/ACT IN AERO: 2.0000 | INHALATION_SPRAY | Freq: Two times a day (BID) | RESPIRATORY_TRACT | 0 refills | Status: AC

## 2023-10-16 MED ORDER — TAMSULOSIN HCL 0.4 MG PO CAPS: ORAL_CAPSULE | ORAL | 2 refills | Status: DC

## 2023-10-16 MED ORDER — ALBUTEROL SULFATE (2.5 MG/3ML) 0.083% IN NEBU
2.5000 mg | INHALATION_SOLUTION | Freq: Four times a day (QID) | RESPIRATORY_TRACT | 5 refills | Status: AC | PRN
Start: 2023-10-16 — End: ?

## 2023-10-16 MED ORDER — AZELASTINE HCL 0.1 % NA SOLN: 1.0000 | Freq: Two times a day (BID) | NASAL | 12 refills | Status: AC

## 2023-10-16 MED ORDER — TAMSULOSIN HCL 0.4 MG PO CAPS
ORAL_CAPSULE | ORAL | 2 refills | Status: DC
Start: 2023-10-16 — End: 2023-11-08

## 2023-10-16 MED ORDER — FLUTICASONE PROPIONATE 50 MCG/ACT NA SUSP: 1.0000 | Freq: Every day | NASAL | 1 refills | Status: AC

## 2023-10-16 MED ORDER — ATORVASTATIN CALCIUM 40 MG PO TABS: 40.0000 mg | ORAL_TABLET | Freq: Every day | ORAL | 3 refills | Status: AC

## 2023-10-16 MED ORDER — VENTOLIN HFA 108 (90 BASE) MCG/ACT IN AERS: 2.0000 | INHALATION_SPRAY | Freq: Four times a day (QID) | RESPIRATORY_TRACT | 2 refills | Status: AC | PRN

## 2023-10-16 NOTE — Addendum Note (Signed)
 Addended by: Morrie Sheldon on: 10/16/2023 09:21 AM   Modules accepted: Orders

## 2023-10-26 ENCOUNTER — Other Ambulatory Visit: Payer: Self-pay

## 2023-10-26 DIAGNOSIS — I10 Essential (primary) hypertension: Secondary | ICD-10-CM

## 2023-10-26 MED ORDER — LOSARTAN POTASSIUM 100 MG PO TABS: 100.0000 mg | ORAL_TABLET | Freq: Every day | ORAL | 3 refills | Status: AC

## 2023-10-26 NOTE — Telephone Encounter (Signed)
 Patient last seen 06/12/23 I called the patient to schedule a follow up appointment in order for [place] to continue to refill his medications, unable to reach the patient I lvm.

## 2023-11-08 ENCOUNTER — Encounter: Payer: Self-pay | Admitting: Student

## 2023-11-08 ENCOUNTER — Ambulatory Visit: Admitting: Student

## 2023-11-08 VITALS — BP 147/78 | HR 71 | Temp 97.8°F | Ht 71.0 in | Wt 265.9 lb

## 2023-11-08 DIAGNOSIS — R32 Unspecified urinary incontinence: Secondary | ICD-10-CM

## 2023-11-08 DIAGNOSIS — R202 Paresthesia of skin: Secondary | ICD-10-CM | POA: Diagnosis not present

## 2023-11-08 DIAGNOSIS — E119 Type 2 diabetes mellitus without complications: Secondary | ICD-10-CM | POA: Diagnosis not present

## 2023-11-08 DIAGNOSIS — I10 Essential (primary) hypertension: Secondary | ICD-10-CM

## 2023-11-08 DIAGNOSIS — Z7984 Long term (current) use of oral hypoglycemic drugs: Secondary | ICD-10-CM

## 2023-11-08 DIAGNOSIS — N400 Enlarged prostate without lower urinary tract symptoms: Secondary | ICD-10-CM

## 2023-11-08 DIAGNOSIS — R339 Retention of urine, unspecified: Secondary | ICD-10-CM

## 2023-11-08 DIAGNOSIS — R200 Anesthesia of skin: Secondary | ICD-10-CM | POA: Diagnosis not present

## 2023-11-08 MED ORDER — CAPSAICIN 0.1 % EX CREA: 1.0000 [IU] | TOPICAL_CREAM | Freq: Three times a day (TID) | CUTANEOUS | 0 refills | Status: AC | PRN

## 2023-11-08 MED ORDER — TAMSULOSIN HCL 0.4 MG PO CAPS: 0.4000 mg | ORAL_CAPSULE | Freq: Every day | ORAL | 2 refills | Status: AC

## 2023-11-08 MED ORDER — DULOXETINE HCL 30 MG PO CPEP: 30.0000 mg | ORAL_CAPSULE | Freq: Every evening | ORAL | 2 refills | Status: DC

## 2023-11-08 NOTE — Assessment & Plan Note (Addendum)
 Last BMP on 06/12/23 - Cr 1.07 HGF 78. Adherent to Losartan 100 mg daily. Previously had been on amlodipine; it was discontinued as patient was lightheaded, especially with Tamsulosin. Will add on Tamsulosin today and refrain from adding an additional antihypertensive. - Recheck blood pressure in one month - If uncontrolled, add low dose amlodipine at follow up - BMP today

## 2023-11-08 NOTE — Progress Notes (Signed)
 Subjective:  CC: Blood pressure follow-up  HPI:  [name] is a 63 y.o. male with a past medical history stated below and presents today for blood pressure follow-up. Please see problem based assessment and plan for additional details.  Past Medical History:  Diagnosis Date   Allergic rhinitis 04/25/2012   Arthritis    Asthma, chronic 10/21/2010   Followed in Pulmonary clinic/ [facility]/ [place]   - HFA 50% 01/04/2011  > 90% 02/04/2011   - PFT's wnl 02/04/2011  - no intubation before  - has been hospitalized once for asthma in 2012 - quit smoking in 2012 - allergic to aspirin and pollen   10/11/2014>>Repeat pulmonary function tests. Performed for disability application FEV1/FVC=68.5%, FEV1 48%. Consistent with COPD, severe Please see results under media    Chronic pain syndrome 02/03/2011   Involving the shoulder joint  05/20/2013: right shoulder x ray >>Degenerative changes AC joint Seen by PT by 11/2013 >> improvement 03/13/2014 D/c from PT due to orange card being expired and pt cancelling appt    Complication of anesthesia    slow to wake up   COPD (chronic obstructive pulmonary disease) (HCC)    exas 6/13, potentially due to ASA use.    Dyspnea    GERD (gastroesophageal reflux disease)    Hyperlipidemia    Hypertension    Obesity (BMI 30-39.9)    Pneumonia 09/2022   Substance abuse (HCC)    cocaine 35 years ago    Current Outpatient Medications on File Prior to Visit  Medication Sig Dispense Refill   acetaminophen (TYLENOL) 325 MG tablet Take 2 tablets (650 mg total) by mouth every 6 (six) hours as needed for mild pain (or Fever >/= 101).     albuterol (PROVENTIL) (2.5 MG/3ML) 0.083% nebulizer solution Take 3 mLs (2.5 mg total) by nebulization every 6 (six) hours as needed for wheezing or shortness of breath. 360 mL 5   atorvastatin (LIPITOR) 40 MG tablet Take 1 tablet (40 mg total) by mouth daily. 90 tablet 3   azelastine (ASTELIN) 0.1 % nasal spray Place 1 spray into  both nostrils 2 (two) times daily. Use in each nostril as directed 30 mL 12   budeson-glycopyrrolate-formoterol (BREZTRI AEROSPHERE) 160-9-4.8 MCG/ACT AERO Inhale 2 puffs into the lungs in the morning and at bedtime. 10.7 g 0   fluticasone (FLONASE) 50 MCG/ACT nasal spray Place 1 spray into both nostrils daily. 9.9 mL 1   guaiFENesin (MUCINEX) 600 MG 12 hr tablet Take 2 tablets (1,200 mg total) by mouth 2 (two) times daily.     loratadine (CLARITIN) 10 MG tablet Take 1 tablet (10 mg total) by mouth daily.     losartan (COZAAR) 100 MG tablet Take 1 tablet (100 mg total) by mouth daily. 90 tablet 3   montelukast (SINGULAIR) 10 MG tablet Take 1 tablet (10 mg total) by mouth at bedtime. 30 tablet 2   polyethylene glycol (MIRALAX / GLYCOLAX) 17 g packet Take 17 g by mouth daily as needed for mild constipation. 14 each 0   predniSONE (DELTASONE) 10 MG tablet Take 4 tablets daily for 3 days 6/18 to 6/20 Take 3 tablets daily for the next 3 days 6/21 to 6/23 Take 2 tablets daily for the following 3 days 6/24 to 6/26 1 tablet daily till done 45 tablet 0   pregabalin (LYRICA) 100 MG capsule TAKE 1 CAPSULE(100 MG) BY MOUTH THREE TIMES DAILY 90 capsule 1   VENTOLIN HFA 108 (90 Base) MCG/ACT  inhaler Inhale 2 puffs into the lungs every 6 (six) hours as needed for wheezing or shortness of breath. 54 g 2   No current facility-administered medications on file prior to visit.    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  Tobacco Use   Smoking status: Former    Current packs/day: 0.00    Average packs/day: 1 pack/day for 40.0 years (40.0 ttl  pk-yrs)    Types: Cigarettes    Start date: 10/30/1969    Quit date: 10/30/2009    Years since quitting: 14.0   Smokeless tobacco: Never  Vaping Use   Vaping status: Never Used  Substance and Sexual Activity   Alcohol use: Yes    Alcohol/week: 2.0 standard drinks of alcohol    Types: 2 Cans of beer per week    Comment: Beer on wkends   Drug use: No    Comment: former cocaine use 35 years ago   Sexual activity: Not on file    Comment: unemployed, was a Investment banker, operational  Other Topics Concern   Not on file  Social History Narrative   Not on file   Social Drivers of Health   Financial Resource Strain: Low Risk  (09/27/2022)   Overall Financial Resource Strain (CARDIA)    Difficulty of Paying Living Expenses: Not hard at all  Food Insecurity: No Food Insecurity (06/12/2023)   Hunger Vital Sign    Worried About Running Out of Food in the Last Year: Never true    Ran Out of Food in the Last Year: Never true  Transportation Needs: No Transportation Needs (06/12/2023)   PRAPARE - Administrator, Civil Service (Medical): No    Lack of Transportation (Non-Medical): No  Physical Activity: Insufficiently Active (09/27/2022)   Exercise Vital Sign    Days of Exercise per Week: 7 days    Minutes of Exercise per Session: 10 min  Stress: No Stress Concern Present (09/27/2022)   [employer] of Occupational Health - Occupational Stress Questionnaire    Feeling of Stress : Not at all  Social Connections: Socially Isolated (06/12/2023)   Social Connection and Isolation Panel [NHANES]    Frequency of Communication with Friends and Family: Three times a week    Frequency of Social Gatherings with Friends and Family: Twice a week    Attends Religious Services: Never    Database administrator or Organizations: No    Attends Banker Meetings: Never    Marital Status: Never married  Intimate Partner Violence: Not At Risk (06/12/2023)   Humiliation, Afraid, Rape, and Kick  questionnaire    Fear of Current or Ex-Partner: No    Emotionally Abused: No    Physically Abused: No    Sexually Abused: No    Review of Systems: ROS negative except for what is noted on the assessment and plan.  Objective:   Vitals:   11/08/23 1053 11/08/23 1113  BP: (!) 149/80 (!) 147/78  Pulse: 74 71  Temp: 97.8 F (36.6 C)   TempSrc:  Oral   SpO2: 95%   Weight: 265 lb 14.4 oz (120.6 kg)   Height: 5\' 11"  (1.803 m)     Physical Exam: Constitutional: well-appearing man sitting in chair, in no acute distress HENT: normocephalic atraumatic, pupils equal and reactive. No ptosis noted. mucous membranes moist Eyes: conjunctiva non-erythematous Neck: supple, no venous congestion noted on exam.  Cardiovascular: regular rate and rhythm, no m/r/g Pulmonary/Chest: normal work of breathing on room air, lungs clear to auscultation bilaterally Abdominal: soft, non-tender, non-distended MSK: normal bulk and tone; negative spurling test bilaterally. No cervical or paraspinal tenderness. Negative neer and scratch test. Negative Tinel's and phalen's test Neurological: alert & oriented x 3, 5/5 strength in bilateral upper and lower extremities, normal gait. Mildly decreased sensation over lateral aspect of hand.  Skin: warm and dry Psych: Pleasant mood and affect.      Assessment & Plan:   Essential hypertension, benign Last BMP on 06/12/23 - Cr 1.07 HGF 78. Adherent to Losartan 100 mg daily. Previously had been on amlodipine; it was discontinued as patient was lightheaded, especially with Tamsulosin. Will add on Tamsulosin today and refrain from adding an additional antihypertensive. - Recheck blood pressure in one month - If uncontrolled, add low dose amlodipine at follow up - BMP today  Urinary incontinence Continues to use adult briefs. Has ot followed up with urology.  Has not refilled Tamsulosin because forgot name.  - Will refill today  Arm numbness left Returns for symptoms  of same. No evidence of venous congestion, ptosis, or anhidrosis. No weakness on exam. No overt signs of carpal tunnel or trigger finger contraction. Unilateral 4th and 5th digit paresthesias could be secondary to ulnar nerve entrapment.  - nerve conduction studies ordered - Added duloxetine 30 mg nightly - Capsaicin cream 0.1 mg TID PRN; precautions to not rub mucus membranes after application reviewed  Diabetes (HCC) Last A1c:  Lab Results  Component Value Date   HGBA1C 7.1 (H) 06/12/2023   Diet; improving carb intake and working on cardiovascular exercise weekly. last BMP on 11/11 with Cr 1.07 and GFR 78. Patient is on ARB and Statin.  - Ambulatory referral to ophthalmology - patient unable to provide urine today; obtain a urine microalbumin/cr ratio at next OV - Patient is afraid of needles, given his BMI >35 with serious comorbid conditions, he will benefit of Rybelsus therapy. If A1c is >7, will precribe and submit prior authorization for approval    Return in about 4 weeks (around 12/06/2023) for Hypertension,medication management, and paresthesias.  Patient discussed with Dr.  Carlisle Cater, MD [facility] Internal Medicine Residency Program  11/08/2023, 5:05 PM

## 2023-11-08 NOTE — Assessment & Plan Note (Addendum)
 Last A1c:  Lab Results  Component Value Date   HGBA1C 7.1 (H) 06/12/2023   Diet; improving carb intake and working on cardiovascular exercise weekly. last BMP on 11/11 with Cr 1.07 and GFR 78. Patient is on ARB and Statin.  - Ambulatory referral to ophthalmology - patient unable to provide urine today; obtain a urine microalbumin/cr ratio at next OV - Patient is afraid of needles, given his BMI >35 with serious comorbid conditions, he will benefit of Rybelsus therapy. If A1c is >7, will precribe and submit prior authorization for approval

## 2023-11-08 NOTE — Patient Instructions (Addendum)
 Thank you, Mr.Vontrell A Blackburn for allowing [place] to provide your care today. Today we discussed   Blood pressure - You blood pressure is high. Please continue taking your Losartan. I will no add an additional medication as you will also start your Tamsulosin ( medication for urinary retention) and this can sometimes affect your blood pressure as well.  - Please return in 1 month for blood pressure check; if uncontrolled, we will need to add a medication  - Check your blood pressure readings at home  BLOOD PRESSURE  Please check your blood pressure daily following the instructions below AND bring your readings to the next outpatient visit  Blood pressure tips   It is best to check your BP 1-2 hours after taking your medications to see the medications effectiveness on your BP.    Here are some tips that our clinical pharmacists share for home BP monitoring:          Rest 10 minutes before taking your blood pressure.          Don't smoke or drink caffeinated beverages for at least 30 minutes before.          Take your blood pressure before (not after) you eat.          Sit comfortably with your back supported and both feet on the floor (don't cross your legs).          Elevate your arm to heart level on a table or a desk.          Use the proper sized cuff. It should fit smoothly and snugly around your bare upper arm. There should be enough room to slip a fingertip under the cuff. The bottom edge of the cuff should be 1 inch above the crease of the elbow.     Take ALL your medications before you return to clinic .   -Diabetes - We will check your A1c and see what your numbers are. If >7, we should start a medication to prevent diabetes complications - I will call you with the results - Continue your atorvastatin  L arm pain - I sent you to get a nerve conduction study to check for signs of nerve entrapment - I will also start  you on a medication called Duloxetine; take a 30 mg tablet at  bedtime   My Chart Access: [email or web address]  Please follow-up in: 1  month    We look forward to seeing you next time. Please call our clinic at [phone] if you have any questions or concerns. The best time to call is Monday-Friday from 9am-4pm, but there is someone available 24/7. If after hours or the weekend, call the main hospital number and ask for the Internal Medicine Resident On-Call. If you need medication refills, please notify your pharmacy one week in advance and they will send [place] a request.   Thank you for letting us take part in your care. Wishing you the best!  Morene Crocker, MD 11/08/2023, 8:35 AM [facility]

## 2023-11-08 NOTE — Assessment & Plan Note (Signed)
 Returns for symptoms of same. No evidence of venous congestion, ptosis, or anhidrosis. No weakness on exam. No overt signs of carpal tunnel or trigger finger contraction. Unilateral 4th and 5th digit paresthesias could be secondary to ulnar nerve entrapment.  - nerve conduction studies ordered - Added duloxetine 30 mg nightly - Capsaicin cream 0.1 mg TID PRN; precautions to not rub mucus membranes after application reviewed

## 2023-11-08 NOTE — Assessment & Plan Note (Signed)
 Continues to use adult briefs. Has ot followed up with urology.  Has not refilled Tamsulosin because forgot name.  - Will refill today

## 2023-11-09 ENCOUNTER — Other Ambulatory Visit: Payer: Self-pay | Admitting: Student

## 2023-11-09 DIAGNOSIS — N179 Acute kidney failure, unspecified: Secondary | ICD-10-CM

## 2023-11-09 LAB — BMP8+ANION GAP
Anion Gap: 18 mmol/L (ref 10.0–18.0)
BUN/Creatinine Ratio: 9 — ABNORMAL LOW (ref 10–24)
BUN: 14 mg/dL (ref 8–27)
CO2: 21 mmol/L (ref 20–29)
Calcium: 9.2 mg/dL (ref 8.6–10.2)
Chloride: 104 mmol/L (ref 96–106)
Creatinine, Ser: 1.58 mg/dL — ABNORMAL HIGH (ref 0.76–1.27)
Glucose: 101 mg/dL — ABNORMAL HIGH (ref 70–99)
Potassium: 3.9 mmol/L (ref 3.5–5.2)
Sodium: 143 mmol/L (ref 134–144)
eGFR: 49 mL/min/{1.73_m2} — ABNORMAL LOW (ref >59)

## 2023-11-09 LAB — HEMOGLOBIN A1C
Est. average glucose Bld gHb Est-mCnc: 154 mg/dL
Hgb A1c MFr Bld: 7 % — ABNORMAL HIGH (ref 4.8–5.6)

## 2023-11-09 NOTE — Progress Notes (Signed)
 Patient with new AKI not seen 4 months ago. Since, patient started on ARB and ran out of Flomax. He has a history of urinary incontinence s/p traumatic pelvic injury. He endorses mild discomfort since stopping flomax but no systemic infectious symptoms. No lower back or colic-type back pain. Suspect the combination of ARB and lack of Flomax contributing. Patient is to avoid NSAIDs and hydrate  Plan - Decrease Losartan to 50 mg daily - Patient will start Tamsulosin - Precautionary symptoms reviewed - Nurse visit on Monday for post void residual - Lab visit on 4/14 for repeat RFP and U/A -Results to be routed to me for follow up and management

## 2023-11-09 NOTE — Progress Notes (Signed)
 Internal Medicine Clinic Attending  Case discussed with the resident at the time of the visit.  We reviewed the resident's history and exam and pertinent patient test results.  I agree with the assessment, diagnosis, and plan of care documented in the resident's note.

## 2023-11-09 NOTE — Telephone Encounter (Signed)
 A Message has been sent to the Provider to determine what type of appointment is needed before reaching out to sch an appt.  Copied from CRM [id number]. Topic: Appointments - Appointment Info/Confirmation >> Nov 09, 2023 12:07 PM Suzette B wrote: Patient/patient representative is calling for information regarding an appointment. Patient was calling in stating that he was scheduled to come in on Monday for an appt. He stated that Dr. Darrel Hoover advised him that she was putting in orders for him to return to the clinic for some testing. He's is needing to speak with someone in the office to confirm this information. Please call the patient back at [phone]

## 2023-11-13 ENCOUNTER — Telehealth: Payer: Self-pay | Admitting: Student

## 2023-11-13 ENCOUNTER — Ambulatory Visit: Admitting: *Deleted

## 2023-11-13 DIAGNOSIS — N179 Acute kidney failure, unspecified: Secondary | ICD-10-CM | POA: Diagnosis not present

## 2023-11-13 NOTE — Progress Notes (Signed)
 Pt voided 100 cc of urine; specimen sent to lab. Post-void residual per bladder scanner was 134 CC - Dr Jenene Miser was informed.

## 2023-11-13 NOTE — Telephone Encounter (Signed)
 Patient was contacted at the request of Dr. Cathey Clunes to come into the clinic for lab and nurse visit for a post void residual.  Just spoke with patient and he plans to be here this morning at 10:45 am.

## 2023-11-14 LAB — RENAL FUNCTION PANEL
Albumin: 4.4 g/dL (ref 3.9–4.9)
BUN/Creatinine Ratio: 13 (ref 10–24)
BUN: 13 mg/dL (ref 8–27)
CO2: 23 mmol/L (ref 20–29)
Calcium: 8.8 mg/dL (ref 8.6–10.2)
Chloride: 102 mmol/L (ref 96–106)
Creatinine, Ser: 1.04 mg/dL (ref 0.76–1.27)
Glucose: 102 mg/dL — ABNORMAL HIGH (ref 70–99)
Phosphorus: 2.8 mg/dL (ref 2.8–4.1)
Potassium: 4.2 mmol/L (ref 3.5–5.2)
Sodium: 139 mmol/L (ref 134–144)
eGFR: 80 mL/min/{1.73_m2} (ref >59)

## 2023-11-16 NOTE — Progress Notes (Signed)
 Improvement in renal function. Patient has pyuria and bacteriuria with E faecium >100K colonies growing. Patient is currently doing well, without LUTS, fevers, n/v, lower back pain. Suspect colonization. Will not treat at this time given high rates of resistance of this organism. No planned instrumentation. Will follow up sensitivities and weigh risk/benefits of treatment based on results.  Discussed with Dr. Bambi Bonine

## 2023-11-17 LAB — UA/M W/RFLX CULTURE, ROUTINE
Bilirubin, UA: NEGATIVE
Glucose, UA: NEGATIVE
Ketones, UA: NEGATIVE
Nitrite, UA: NEGATIVE
RBC, UA: NEGATIVE
Specific Gravity, UA: 1.021 (ref 1.005–1.030)
Urobilinogen, Ur: 1 mg/dL (ref 0.2–1.0)
pH, UA: 6.5 (ref 5.0–7.5)

## 2023-11-17 LAB — MICROSCOPIC EXAMINATION
Casts: NONE SEEN /LPF
WBC, UA: >30 /HPF — AB (ref 0–5)

## 2023-11-17 LAB — URINE CULTURE, REFLEX

## 2023-11-20 NOTE — Progress Notes (Signed)
 Multidrug resistant E faecium - called the patient to inform about the results and confirmed lack of LUTS and/or systemic signs of infections. He was advised to call us  back if he develops any symptoms as he will require IV vancomycin.

## 2023-12-11 ENCOUNTER — Ambulatory Visit: Admitting: Student

## 2023-12-11 ENCOUNTER — Other Ambulatory Visit: Payer: Self-pay

## 2023-12-11 ENCOUNTER — Telehealth: Payer: Self-pay

## 2023-12-11 ENCOUNTER — Encounter: Payer: Self-pay | Admitting: Student

## 2023-12-11 VITALS — BP 123/74 | HR 79 | Temp 98.2°F | Ht 71.0 in | Wt 258.0 lb

## 2023-12-11 DIAGNOSIS — I10 Essential (primary) hypertension: Secondary | ICD-10-CM

## 2023-12-11 DIAGNOSIS — Z7985 Long-term (current) use of injectable non-insulin antidiabetic drugs: Secondary | ICD-10-CM | POA: Diagnosis not present

## 2023-12-11 DIAGNOSIS — E119 Type 2 diabetes mellitus without complications: Secondary | ICD-10-CM

## 2023-12-11 DIAGNOSIS — R200 Anesthesia of skin: Secondary | ICD-10-CM

## 2023-12-11 DIAGNOSIS — R339 Retention of urine, unspecified: Secondary | ICD-10-CM | POA: Diagnosis not present

## 2023-12-11 MED ORDER — RYBELSUS 3 MG PO TABS: 3.0000 mg | ORAL_TABLET | Freq: Every day | ORAL | 0 refills | Status: DC

## 2023-12-11 NOTE — Assessment & Plan Note (Addendum)
 Patient's BP 141/73 and 123/74 on repeat. Denies chest pain, heart palpitations, vision changes or other symptoms today. Did experience one episode of central chest tightness/discomfort some weeks ago that lasted about 30 minutes and went away on its own. Happened while patient was at rest, denied other symptoms. Discussed return precautions should episode happen again. Chart review states patient is taking Losartan  100 mg daily and Tamsulosin  0.4 mg daily for blood pressure control but patient states he is taking Losartan  50 mg (breaks tablet in half) along with Tamsulosin , was told to do this to help with any symptoms of lightheadedness which he denies today. Given today's repeat BP 120s/70s will not make further adjustments.  Plan - Continue Losartan  100 mg daily taken as Losartan  50 mg daily as needed - Continue Tamsulosin  0.4 mg daily - Patient to return in 1 month with all medications

## 2023-12-11 NOTE — Telephone Encounter (Signed)
 [name] (Key: [id number]) Rx #: [id number] Rybelsus 3MG  tablets Form [facility] [place]  Medicaid Electronic PA Form [phone] NCPDP) Created Sent to Plan Plan Response Submit Clinical Questions Determination Favorable Message from Plan PA Case: [id number], Status: Approved, Coverage Starts on: 12/11/2023 12:00:00 AM, Coverage Ends on: 12/10/2024 12:00:00 AM.. Authorization Expiration Date: Dec 10, 2024.

## 2023-12-11 NOTE — Patient Instructions (Addendum)
 Thank you, Mr.Random A Sypher for allowing us  to provide your care today. Today we discussed your blood pressure, left arm numbness, and diabetes medication.  I have ordered the following labs for you:  Lab Orders  No laboratory test(s) ordered today     Tests ordered today:    Referrals ordered today:   Referral Orders  No referral(s) requested today     I have ordered the following medication/changed the following medications:   Stop the following medications: There are no discontinued medications.   Start the following medications: Meds ordered this encounter  Medications   Semaglutide (RYBELSUS) 3 MG TABS    Sig: Take 1 tablet (3 mg total) by mouth daily. Take 30 minutes before breakfast.    Dispense:  30 tablet    Refill:  0     Follow up: 1 month for Rybelsus dose increase and follow up on referrals   Remember:   - You will start a medication called Rybelsus for diabetes/weight loss. The starting dose is 3 mg daily taken 30 minutes before breakfast for 30 days. After 30 days the dose will go up to 7 mg daily.   - You were referred to [facility] for an eye exam related to your diabetes, please call to set up an appointment:  Eye care center in [place], [place]    Located in: [facility] Address: [address] [place], [place], [place] [address] Phone: [phone]  - We will follow up on the nerve conduction test that was ordered and give you a call to help you schedule it.   - Please remember that if you develop any symptoms of urinary tract infection such as burning with urination, difficulties urinating, fever, etc. please call us  as you may need to be started on IV antibiotics.   - Please let us  know at the next visit if you continue to experience chest tightness that comes and goes. It sounds like your last episode resolved and has not returned, but if it does and it is accompanied by arm weakness, abdominal pain, headache, or vision changes please  seek medical assistance as you may be experiencing a medical emergency.    Should you have any questions or concerns please call the internal medicine clinic at [phone].     Kule Gascoigne Arellano Zameza, MD PGY-1 Internal Medicine Teaching Progam [facility]

## 2023-12-11 NOTE — Progress Notes (Addendum)
 Established Patient Office Visit  Subjective   Patient ID: TREYSEAN SOINE, male    DOB: 11/27/1960  Age: 63 y.o. MRN: [id number]  Chief Complaint  Patient presents with   Follow-up    4 wk    Patient is a 63 y.o. with a past medical history stated below who presents today for follow-up for hypertension, paresthesia of the left arm, and diaebtes. Please see problem based assessment and plan for additional details.      Past Medical History:  Diagnosis Date   Allergic rhinitis 04/25/2012   Arthritis    Asthma, chronic 10/21/2010   Followed in Pulmonary clinic/ [facility]/ [place]   - HFA 50% 01/04/2011  > 90% 02/04/2011   - PFT's wnl 02/04/2011  - no intubation before  - has been hospitalized once for asthma in 2012 - quit smoking in 2012 - allergic to aspirin and pollen   10/11/2014>>Repeat pulmonary function tests. Performed for disability application FEV1/FVC=68.5%, FEV1 48%. Consistent with COPD, severe Please see results under media    Chronic pain syndrome 02/03/2011   Involving the shoulder joint  05/20/2013: right shoulder x ray >>Degenerative changes AC joint Seen by PT by 11/2013 >> improvement 03/13/2014 D/c from PT due to orange card being expired and pt cancelling appt    Complication of anesthesia    slow to wake up   COPD (chronic obstructive pulmonary disease) (HCC)    exas 6/13, potentially due to ASA use.    Dyspnea    GERD (gastroesophageal reflux disease)    Hyperlipidemia    Hypertension    Obesity (BMI 30-39.9)    Pneumonia 09/2022   Substance abuse (HCC)    cocaine 35 years ago      ROS    Objective:     BP 123/74 (BP Location: Left Arm, Cuff Size: Large)   Pulse 79   Temp 98.2 F (36.8 C) (Oral)   Ht 5\' 11"  (1.803 m)   Wt 258 lb (117 kg)   SpO2 94%   BMI 35.98 kg/m  BP Readings from Last 3 Encounters:  12/11/23 123/74  11/08/23 (!) 147/78  06/12/23 137/87   Wt Readings from Last 3 Encounters:  12/11/23 258 lb (117 kg)  11/08/23 265 lb 14.4  oz (120.6 kg)  06/12/23 245 lb (111.1 kg)      Physical Exam HENT:     Head: Normocephalic and atraumatic.  Cardiovascular:     Rate and Rhythm: Normal rate and regular rhythm.     Pulses: Normal pulses.     Heart sounds: Normal heart sounds.  Pulmonary:     Effort: Pulmonary effort is normal.  Musculoskeletal:        General: No swelling.  Skin:    General: Skin is warm.  Neurological:     General: No focal deficit present.     Mental Status: He is alert.  Psychiatric:        Mood and Affect: Mood normal.      No results found for any visits on 12/11/23.  Last hemoglobin A1c Lab Results  Component Value Date   HGBA1C 7.0 (H) 11/08/2023     The ASCVD Risk score (Arnett DK, et al., 2019) failed to calculate for the following reasons:   The valid total cholesterol range is 130 to 320 mg/dL    Assessment & Plan:   Problem List Items Addressed This Visit     Essential hypertension, benign - Primary (Chronic)   Patient's BP  141/73 and 123/74 on repeat. Denies chest pain, heart palpitations, vision changes or other symptoms today. Did experience one episode of central chest tightness/discomfort some weeks ago that lasted about 30 minutes and went away on its own. Happened while patient was at rest, denied other symptoms. Discussed return precautions should episode happen again. Chart review states patient is taking Losartan  100 mg daily and Tamsulosin  0.4 mg daily for blood pressure control but patient states he is taking Losartan  50 mg (breaks tablet in half) along with Tamsulosin , was told to do this to help with any symptoms of lightheadedness which he denies today. Given today's repeat BP 120s/70s will not make further adjustments.  Plan - Continue Losartan  100 mg daily taken as Losartan  50 mg daily as needed - Continue Tamsulosin  0.4 mg daily - Patient to return in 1 month with all medications       Retention of urine   Discussed findings of multidrug resistant E  faecium from UA at last visit. Patient denies any urinary symptoms today. States he is still wearing briefs (gets them from [employer]) and follows with urology. Discussed return precautions should he develop fever/symptoms as he will need to come in for further workup and possible treatment with IV vancomycin at that time.       Arm numbness left   Patient states symptoms are similar to last visit. Did not get call to set up nerve conduction test. Physical exam stable. Discussed need to get conduction study done. Patient was started on duloxetine  30 mg daily at bedtime but states this has not been helpful for symptoms, seemed unsure if it's the medication he is taking at bedtime. Did not start using capsaicin  cream as recommended as he thought it was prescription, reminded it was over the counter. Recommended the use of a wrist brace/splint worn at night to help with symptoms.  Plan - Patient to continue duloxetine  30 mg daily at bedtime; recommended having patient bring in his medications to next visit  - Patient to try wearing wrist splint on left wrist/hand as well as capsaicin  cream for additional symptom relief  - Will follow up on nerve conduction study with [facility] to help determine next steps      Diabetes [facility])   Patient asked for clarification as to whether he has diabetes. Provided patient education. Discussed last Hgb A1c of 7% and through shared decision making decided to move forward with Rybelsus therapy given diabetes and BMI > 35. Patient hopes to experience some weight loss as he has tried diet and exercise without much success, prefers oral medication at this time. Reviewed indications, common side effects, and instructions with patient. Patient also given information for [facility] to set up an appointment with ophthalmology for diabetic eye exam.  Plan - START Rybelsus 3 mg daily for 30 days - Return in 1 month to increase Rybelsus to 7 mg if tolerated well - Follow up on  ophthalmology visit       Relevant Medications   Semaglutide (RYBELSUS) 3 MG TABS   Patient discussed with Dr. Jarvis Mesa Return in about 4 weeks (around 01/08/2024) for Rybelsus dose increase and follow up on referrals.    Abanoub Hanken Arellano Zameza, MD PGY-1, [facility]

## 2023-12-11 NOTE — Assessment & Plan Note (Signed)
 Discussed findings of multidrug resistant E faecium from UA at last visit. Patient denies any urinary symptoms today. States he is still wearing briefs (gets them from [employer]) and follows with urology. Discussed return precautions should he develop fever/symptoms as he will need to come in for further workup and possible treatment with IV vancomycin at that time.

## 2023-12-11 NOTE — Assessment & Plan Note (Addendum)
 Patient asked for clarification as to whether he has diabetes. Provided patient education. Discussed last Hgb A1c of 7% and through shared decision making decided to move forward with Rybelsus therapy given diabetes and BMI > 35. Patient hopes to experience some weight loss as he has tried diet and exercise without much success, prefers oral medication at this time. Reviewed indications, common side effects, and instructions with patient. Patient also given information for [facility] to set up an appointment with ophthalmology for diabetic eye exam.  Plan - START Rybelsus 3 mg daily for 30 days - Return in 1 month to increase Rybelsus to 7 mg if tolerated well - Follow up on ophthalmology visit

## 2023-12-11 NOTE — Assessment & Plan Note (Addendum)
 Patient states symptoms are similar to last visit. Did not get call to set up nerve conduction test. Physical exam stable. Discussed need to get conduction study done. Patient was started on duloxetine  30 mg daily at bedtime but states this has not been helpful for symptoms, seemed unsure if it's the medication he is taking at bedtime. Did not start using capsaicin  cream as recommended as he thought it was prescription, reminded it was over the counter. Recommended the use of a wrist brace/splint worn at night to help with symptoms.  Plan - Patient to continue duloxetine  30 mg daily at bedtime; recommended having patient bring in his medications to next visit  - Patient to try wearing wrist splint on left wrist/hand as well as capsaicin  cream for additional symptom relief  - Will follow up on nerve conduction study with [facility] to help determine next steps

## 2023-12-11 NOTE — Telephone Encounter (Signed)
 Prior Authorization for patient (Rybelsus 3MG  tablets) came through on cover my meds was submitted with last office notes and labs awaiting approval or denial.  ZOX:[id number]

## 2023-12-13 NOTE — Progress Notes (Signed)
 Internal Medicine Clinic Attending  Case discussed with the resident at the time of the visit.  We reviewed the resident's history and exam and pertinent patient test results.  I agree with the assessment, diagnosis, and plan of care documented in the resident's note.

## 2023-12-13 NOTE — Addendum Note (Signed)
 Addended by: Tyah Acord L on: 12/13/2023 09:58 AM   Modules accepted: Level of Service

## 2024-01-09 ENCOUNTER — Encounter: Payer: Self-pay | Admitting: *Deleted

## 2024-01-12 ENCOUNTER — Other Ambulatory Visit: Payer: Self-pay | Admitting: Student

## 2024-01-12 DIAGNOSIS — G8929 Other chronic pain: Secondary | ICD-10-CM

## 2024-01-18 ENCOUNTER — Other Ambulatory Visit: Payer: Self-pay | Admitting: Student

## 2024-01-18 DIAGNOSIS — R202 Paresthesia of skin: Secondary | ICD-10-CM

## 2024-01-21 ENCOUNTER — Other Ambulatory Visit: Payer: Self-pay | Admitting: Student

## 2024-01-21 DIAGNOSIS — J4489 Other specified chronic obstructive pulmonary disease: Secondary | ICD-10-CM

## 2024-01-22 MED ORDER — MONTELUKAST SODIUM 10 MG PO TABS
10.0000 mg | ORAL_TABLET | Freq: Every day | ORAL | 2 refills | Status: DC
Start: 2024-01-22 — End: 2024-01-29

## 2024-01-22 NOTE — Telephone Encounter (Addendum)
 Unable to send medication to the pharmacy, rx keeps converting back to print instead of normal. Please send rx in

## 2024-01-22 NOTE — Addendum Note (Signed)
 Addended by: Reneta Niehaus on: 01/22/2024 08:28 AM   Modules accepted: Orders

## 2024-01-29 ENCOUNTER — Ambulatory Visit

## 2024-01-29 ENCOUNTER — Other Ambulatory Visit: Payer: Self-pay

## 2024-01-29 VITALS — BP 153/83 | HR 67 | Temp 97.7°F | Ht 71.0 in | Wt 242.2 lb

## 2024-01-29 DIAGNOSIS — J4489 Other specified chronic obstructive pulmonary disease: Secondary | ICD-10-CM

## 2024-01-29 DIAGNOSIS — R339 Retention of urine, unspecified: Secondary | ICD-10-CM | POA: Diagnosis not present

## 2024-01-29 DIAGNOSIS — E7800 Pure hypercholesterolemia, unspecified: Secondary | ICD-10-CM

## 2024-01-29 DIAGNOSIS — Z23 Encounter for immunization: Secondary | ICD-10-CM | POA: Diagnosis present

## 2024-01-29 DIAGNOSIS — R200 Anesthesia of skin: Secondary | ICD-10-CM | POA: Diagnosis not present

## 2024-01-29 DIAGNOSIS — J4540 Moderate persistent asthma, uncomplicated: Secondary | ICD-10-CM

## 2024-01-29 DIAGNOSIS — J45909 Unspecified asthma, uncomplicated: Secondary | ICD-10-CM | POA: Diagnosis not present

## 2024-01-29 DIAGNOSIS — Z7985 Long-term (current) use of injectable non-insulin antidiabetic drugs: Secondary | ICD-10-CM | POA: Diagnosis not present

## 2024-01-29 DIAGNOSIS — Z1211 Encounter for screening for malignant neoplasm of colon: Secondary | ICD-10-CM

## 2024-01-29 DIAGNOSIS — E119 Type 2 diabetes mellitus without complications: Secondary | ICD-10-CM | POA: Diagnosis not present

## 2024-01-29 DIAGNOSIS — I10 Essential (primary) hypertension: Secondary | ICD-10-CM

## 2024-01-29 DIAGNOSIS — M549 Dorsalgia, unspecified: Secondary | ICD-10-CM | POA: Diagnosis not present

## 2024-01-29 DIAGNOSIS — Z0000 Encounter for general adult medical examination without abnormal findings: Secondary | ICD-10-CM

## 2024-01-29 DIAGNOSIS — I2089 Other forms of angina pectoris: Secondary | ICD-10-CM | POA: Diagnosis not present

## 2024-01-29 MED ORDER — RYBELSUS 3 MG PO TABS: 3.0000 mg | ORAL_TABLET | Freq: Every day | ORAL | 0 refills | Status: AC

## 2024-01-29 MED ORDER — MONTELUKAST SODIUM 10 MG PO TABS: 10.0000 mg | ORAL_TABLET | Freq: Every day | ORAL | 3 refills | Status: AC

## 2024-01-29 NOTE — Progress Notes (Deleted)
   Established Patient Office Visit  Subjective   Patient ID: [name], male    DOB: 10-Feb-1961  Age: 63 y.o. MRN: [id number]  Chief Complaint  Patient presents with  . Follow-up    Routine office exam DM / meet new doctor  / medication refill    HPI Pt is 63 year old amn who presents to the clinic for routine f/u on chronic conditions.  {History (Optional):23778}  ROS    Objective:     BP (!) 144/80 (BP Location: Left Arm, Patient Position: Sitting, Cuff Size: Large)   Pulse 69   Temp 97.7 F (36.5 C) (Oral)   Ht 5' 11 (1.803 m)   Wt 242 lb 3.2 oz (109.9 kg)   SpO2 90%   BMI 33.78 kg/m  {Vitals History (Optional):23777}  Physical Exam   No results found for any visits on 01/29/24.  {Labs (Optional):23779}  The ASCVD Risk score (Arnett DK, et al., 2019) failed to calculate for the following reasons:   The valid total cholesterol range is 130 to 320 mg/dL    Assessment & Plan:   Problem List Items Addressed This Visit       Endocrine   Type 2 diabetes mellitus treated without insulin (HCC) (Chronic)    No follow-ups on file.    Armando Rossetti, MD

## 2024-01-29 NOTE — Assessment & Plan Note (Addendum)
 The patient has BPH and reports dribbling and increased urinary frequency but denies dysuria or discharge. Continue tamsulosin  as prescribed.  Plan:  Continue tamsulosin .  Monitor symptoms and assess for any worsening or new urinary complaints.  Consider further urological evaluation if symptoms persist or worsen.

## 2024-01-29 NOTE — Assessment & Plan Note (Addendum)
 The patient is willing to receive the pneumococcal vaccine. He is unable to undergo colonoscopy due to an allergy to anesthesia but has expressed interest in trying Cologuard as an alternative screening method.  Plan:  Prevnar 20 vaccine ordered. Cologuard test ordered as an alternative colorectal cancer screening.

## 2024-01-29 NOTE — Assessment & Plan Note (Signed)
 The patient reports constant back pain that began approximately one week ago. The pain radiates to the pelvic area, where he has a history of pelvic fracture. He trialed pregabalin  without relief. The pain worsens with sudden movements such as sneezing or coughing, but is not typically triggered by routine movements like bending.  Plan:  Start Tylenol  for pain management.  Recommend gentle stretching and core-strengthening exercises.  If symptoms persist or worsen, or if new neurological or red flag symptoms develop, consider lumbar spine X-ray or MRI for further evaluation.

## 2024-01-29 NOTE — Assessment & Plan Note (Addendum)
 The patient has been taking Rybelsus  since the last visit and reports no gastrointestinal side effects. He experiences numbness in the right arm, which does not appear to be related to diabetes. Physical exam reveals no foot ulcers. His HbA1c, checked on 11/10/23, was 7%.  Assessment: Diabetes is moderately controlled. Right arm numbness unlikely related to diabetes.  Plan: Continue Rybelsus  as prescribed. Encourage routine foot care and regular follow-up. Recommend ophthalmology referral for diabetic eye exam.

## 2024-01-29 NOTE — Assessment & Plan Note (Addendum)
 The patient is taking atorvastatin  and reports no medication side effects. Continue current therapy.

## 2024-01-29 NOTE — Patient Instructions (Addendum)
 Thank you, [name], for allowing us  to be part of your care today.  During today's visit, we addressed your back pain and decided to manage it with Tylenol  and the use of an ice pack. We also discussed your blood pressure, and you will be resuming Losartan  100 mg.  Regarding the numbness in your right arm, we will continue treatment with Duloxetine  and Tylenol . Additionally, please follow up on your Neurology referral by calling [phone].  We also discussed your recent chest pain. A referral has been sent to Cardiology for a cardiac stress test.    I have ordered the following labs for you:   Lab Orders         Cologuard      Tests ordered today:    Referrals ordered today:    Referral Orders         Ambulatory referral to Cardiology      I have ordered the following medication/changed the following medications:   Stop the following medications: There are no discontinued medications.   Start the following medications: No orders of the defined types were placed in this encounter.    Follow up: 3 months   Remember: Call for Nerve Conduction Study and increase your Losartan  dose to 100 mg.  Should you have any questions or concerns please call the internal medicine clinic at [phone].   Armando Rossetti, M.D [facility]

## 2024-01-29 NOTE — Progress Notes (Unsigned)
 CC: Follow up on chronic conditions and back pain.  HPI:  [name] is a 63 y.o. male living with a history stated below and presents today for Follow up on his chronic conditions and back pain which started a week ago. Please see problem based assessment and plan for additional details.  Past Medical History:  Diagnosis Date   Allergic rhinitis 04/25/2012   Arthritis    Asthma, chronic 10/21/2010   Followed in Pulmonary clinic/ [facility]/ [place]   - HFA 50% 01/04/2011  > 90% 02/04/2011   - PFT's wnl 02/04/2011  - no intubation before  - has been hospitalized once for asthma in 2012 - quit smoking in 2012 - allergic to aspirin and pollen   10/11/2014>>Repeat pulmonary function tests. Performed for disability application FEV1/FVC=68.5%, FEV1 48%. Consistent with COPD, severe Please see results under media    Chronic pain syndrome 02/03/2011   Involving the shoulder joint  05/20/2013: right shoulder x ray >>Degenerative changes AC joint Seen by PT by 11/2013 >> improvement 03/13/2014 D/c from PT due to orange card being expired and pt cancelling appt    Complication of anesthesia    slow to wake up   COPD (chronic obstructive pulmonary disease) (HCC)    exas 6/13, potentially due to ASA use.    Dyspnea    GERD (gastroesophageal reflux disease)    Hyperlipidemia    Hypertension    Obesity (BMI 30-39.9)    Pneumonia 09/2022   Substance abuse (HCC)    cocaine 35 years ago   Type 2 diabetes mellitus treated without insulin (HCC)     Current Outpatient Medications on File Prior to Visit  Medication Sig Dispense Refill   acetaminophen  (TYLENOL ) 325 MG tablet Take 2 tablets (650 mg total) by mouth every 6 (six) hours as needed for mild pain (or Fever >/= 101).     albuterol  (PROVENTIL ) (2.5 MG/3ML) 0.083% nebulizer solution Take 3 mLs (2.5 mg total) by nebulization every 6 (six) hours as needed for wheezing or shortness of breath. 360 mL 5   atorvastatin  (LIPITOR) 40 MG tablet  Take 1 tablet (40 mg total) by mouth daily. 90 tablet 3   azelastine  (ASTELIN ) 0.1 % nasal spray Place 1 spray into both nostrils 2 (two) times daily. Use in each nostril as directed 30 mL 12   budeson-glycopyrrolate-formoterol  (BREZTRI  AEROSPHERE) 160-9-4.8 MCG/ACT AERO Inhale 2 puffs into the lungs in the morning and at bedtime. 10.7 g 0   Capsaicin  0.1 % CREA Apply 1 Units topically 3 (three) times daily as needed. 42.5 g 0   DULoxetine  (CYMBALTA ) 30 MG capsule TAKE 1 CAPSULE(30 MG) BY MOUTH AT BEDTIME 30 capsule 2   fluticasone  (FLONASE ) 50 MCG/ACT nasal spray Place 1 spray into both nostrils daily. 9.9 mL 1   guaiFENesin  (MUCINEX ) 600 MG 12 hr tablet Take 2 tablets (1,200 mg total) by mouth 2 (two) times daily.     loratadine  (CLARITIN ) 10 MG tablet Take 1 tablet (10 mg total) by mouth daily.     losartan  (COZAAR ) 100 MG tablet Take 1 tablet (100 mg total) by mouth daily. 90 tablet 3   polyethylene glycol (MIRALAX  / GLYCOLAX ) 17 g packet Take 17 g by mouth daily as needed for mild constipation. 14 each 0   predniSONE  (DELTASONE ) 10 MG tablet Take 4 tablets daily for 3 days 6/18 to 6/20 Take 3 tablets daily for the next 3 days 6/21 to 6/23 Take 2 tablets daily  for the following 3 days 6/24 to 6/26 1 tablet daily till done 45 tablet 0   pregabalin  (LYRICA ) 100 MG capsule TAKE 1 CAPSULE(100 MG) BY MOUTH THREE TIMES DAILY 90 capsule 1   tamsulosin  (FLOMAX ) 0.4 MG CAPS capsule Take 1 capsule (0.4 mg total) by mouth daily after breakfast. 90 capsule 2   VENTOLIN  HFA 108 (90 Base) MCG/ACT inhaler Inhale 2 puffs into the lungs every 6 (six) hours as needed for wheezing or shortness of breath. 54 g 2   No current facility-administered medications on file prior to visit.    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  Tobacco Use   Smoking status: Former    Current packs/day: 0.00    Average packs/day: 1 pack/day for 40.0 years (40.0 ttl pk-yrs)    Types: Cigarettes    Start date: 10/30/1969    Quit date: 10/30/2009    Years since quitting: 14.2   Smokeless tobacco: Never  Vaping Use   Vaping status: Never Used  Substance and Sexual Activity   Alcohol use: Yes    Alcohol/week: 2.0 standard drinks of alcohol    Types: 2 Cans of beer per week    Comment: Beer on wkends   Drug use: No    Comment: former cocaine use 35 years ago   Sexual activity: Not on file    Comment: unemployed, was a Investment banker, operational  Other Topics Concern   Not on file  Social History Narrative   Not on file   Social Drivers of Health   Financial Resource Strain: Low Risk  (01/29/2024)   Overall Financial Resource Strain (CARDIA)    Difficulty of Paying Living Expenses: Not hard at all  Food Insecurity: No Food Insecurity (01/29/2024)   Hunger Vital Sign    Worried About Running Out of Food in the Last Year: Never true    Ran Out of Food in the Last Year: Never true  Transportation Needs: No Transportation Needs (06/12/2023)   PRAPARE - Administrator, Civil Service (Medical): No    Lack of Transportation (Non-Medical): No  Physical Activity: Insufficiently Active (09/27/2022)   Exercise Vital Sign    Days of Exercise per Week: 7 days    Minutes of Exercise per Session: 10 min  Stress: No Stress Concern Present (01/29/2024)   [employer] of Occupational Health - Occupational Stress Questionnaire    Feeling of Stress: Not at all  Social Connections: Moderately Isolated (01/29/2024)   Social Connection and Isolation Panel    Frequency of Communication with Friends and Family: More than three times a week    Frequency of Social  Gatherings with Friends and Family: Twice a week    Attends Religious Services: More than 4 times per year    Active Member of Clubs or Organizations: Patient unable to answer    Attends Banker Meetings: Never    Marital Status: Never married  Intimate Partner Violence: Not At Risk (01/29/2024)   Humiliation, Afraid, Rape, and Kick questionnaire  Fear of Current or Ex-Partner: No    Emotionally Abused: No    Physically Abused: No    Sexually Abused: No    Review of Systems: ROS negative except for what is noted on the assessment and plan.  Vitals:   01/29/24 1508 01/29/24 1513 01/29/24 1653  BP: (!) 144/80 (!) 146/89 (!) 153/83  Pulse: 69 77 67  Temp: 97.7 F (36.5 C)    TempSrc: Oral    SpO2: 90%    Weight: 242 lb 3.2 oz (109.9 kg)    Height: 5' 11 (1.803 m)      Physical Exam  Physical Exam: Constitutional: well-appearing, in no acute distress Eyes: conjunctiva non-erythematous Neck: Decreased neck ROM  Cardiovascular: regular rate and rhythm, no m/r/g Pulmonary/Chest: normal work of breathing on room air, lungs clear to auscultation bilaterally MSK: Mild tenderness on lower back Neurological: 5/5 strength in bilateral upper extremities, normal gait, Right hand sensation was intact   Assessment & Plan:   Asthma, chronic The patient denies any changes in sputum or increased cough and reports no shortness of breath. He is taking all prescribed medications except Mucinex  and has no new concerns. Lung exam was clear and normal. Respiratory status remains stable. Continue current medications, encourage adherence to all meds including Mucinex  if needed, and monitor for any symptom changes. Follow up as needed.  Essential hypertension, benign The patient has been taking Cozaar  50 mg since the last visit. Blood pressure was 144/90, but we will recheck it. He reports blurred vision, which he attributes to needing glasses, and experiences headaches associated  with it. He also reports intermittent compressive chest pain lasting about 15 minutes every two weeks, typically occurring during walking. The pain does not improve with rest or any other measures.  Assessment: Elevated blood pressure with symptoms concerning for possible cardiac ischemia. Blurred vision likely related to refractive error.  Plan:  Increase Cozaar  dose to 100 mg daily.  Refer to cardiology for cardiac stress test to evaluate chest pain.  Recheck blood pressure at next visit.  Monitor vision and headaches; advise follow-up with ophthalmology if symptoms persist or worsen.   Type 2 diabetes mellitus treated without insulin (HCC) The patient has been taking Rybelsus  since the last visit and reports no gastrointestinal side effects. He experiences numbness in the right arm, which does not appear to be related to diabetes. Physical exam reveals no foot ulcers. His HbA1c, checked on 11/10/23, was 7%.  Assessment: Diabetes is moderately controlled. Right arm numbness unlikely related to diabetes.  Plan: Continue Rybelsus  as prescribed. Encourage routine foot care and regular follow-up. Recommend ophthalmology referral for diabetic eye exam.  Healthcare maintenance The patient is willing to receive the pneumococcal vaccine. He is unable to undergo colonoscopy due to an allergy to anesthesia but has expressed interest in trying Cologuard as an alternative screening method.  Plan:  Prevnar 20 vaccine ordered. Cologuard test ordered as an alternative colorectal cancer screening.  Retention of urine The patient has BPH and reports dribbling and increased urinary frequency but denies dysuria or discharge. Continue tamsulosin  as prescribed.  Plan:  Continue tamsulosin .  Monitor symptoms and assess for any worsening or new urinary complaints.  Consider further urological evaluation if symptoms persist or worsen.    Hypercholesteremia The patient is taking atorvastatin   and reports no medication side effects. Continue current therapy.  Arm numbness left The patient continues to experience symptoms extending from the fingertips up to the neck and upper side of the left  arm. Duloxetine  was prescribed but has not provided relief. On physical exam, sensation and pulses are normal. Neck flexion elicits shooting pain in the right arm.  Assessment: Persistent neuropathic symptoms, likely cervical radiculopathy. Patient has limited understanding of nerve conduction velocity (NCV) testing and was advised to call and try to expedite the neurology appointment.  Plan:  Order NCV; patient informed, and neurology appointment scheduled for October.  Continue Symbalta (duloxetine ) and Tylenol  for symptom management.  Recommend weight loss to help alleviate symptoms.    Back pain The patient reports constant back pain that began approximately one week ago. The pain radiates to the pelvic area, where he has a history of pelvic fracture. He trialed pregabalin  without relief. The pain worsens with sudden movements such as sneezing or coughing, but is not typically triggered by routine movements like bending.  Plan:  Start Tylenol  for pain management.  Recommend gentle stretching and core-strengthening exercises.  If symptoms persist or worsen, or if new neurological or red flag symptoms develop, consider lumbar spine X-ray or MRI for further evaluation.    Patient seen with Dr. Lovie Armando Rossetti M.D [facility] Internal Medicine, PGY-1 Phone: [phone] Date 01/29/2024 Time 10:09 PM

## 2024-01-29 NOTE — Assessment & Plan Note (Addendum)
 The patient continues to experience symptoms extending from the fingertips up to the neck and upper side of the left arm. Duloxetine  was prescribed but has not provided relief. On physical exam, sensation and pulses are normal. Neck flexion elicits shooting pain in the right arm.  Assessment: Persistent neuropathic symptoms, likely cervical radiculopathy. Patient has limited understanding of nerve conduction velocity (NCV) testing and was advised to call and try to expedite the neurology appointment.  Plan:  Order NCV; patient informed, and neurology appointment scheduled for October.  Continue Symbalta (duloxetine ) and Tylenol  for symptom management.  Recommend weight loss to help alleviate symptoms.

## 2024-01-29 NOTE — Assessment & Plan Note (Addendum)
 The patient denies any changes in sputum or increased cough and reports no shortness of breath. He is taking all prescribed medications except Mucinex  and has no new concerns. Lung exam was clear and normal. Respiratory status remains stable. Continue current medications, encourage adherence to all meds including Mucinex  if needed, and monitor for any symptom changes. Follow up as needed.

## 2024-01-29 NOTE — Assessment & Plan Note (Addendum)
 The patient has been taking Cozaar  50 mg since the last visit. Blood pressure was 144/90, but we will recheck it. He reports blurred vision, which he attributes to needing glasses, and experiences headaches associated with it. He also reports intermittent compressive chest pain lasting about 15 minutes every two weeks, typically occurring during walking. The pain does not improve with rest or any other measures.  Assessment: Elevated blood pressure with symptoms concerning for possible cardiac ischemia. Blurred vision likely related to refractive error.  Plan:  Increase Cozaar  dose to 100 mg daily.  Refer to cardiology for cardiac stress test to evaluate chest pain.  Recheck blood pressure at next visit.  Monitor vision and headaches; advise follow-up with ophthalmology if symptoms persist or worsen.

## 2024-01-30 DIAGNOSIS — I2089 Other forms of angina pectoris: Secondary | ICD-10-CM | POA: Insufficient documentation

## 2024-01-30 NOTE — Progress Notes (Signed)
 Internal Medicine Clinic Attending  I was physically present during the key portions of the resident provided service and participated in the medical decision making of patient's management care. I reviewed pertinent patient test results.  The assessment, diagnosis, and plan were formulated together and I agree with the documentation in the resident's note.  Lovie Clarity, MD   Complex patient with lots going on.  Patient is having intermittent substernal chest pain brought on by exertion and relieved by rest. I agree with Cardiology referral for stress testing.   I also agree with plan to increase Losartan  back up from 50 to 100mg  daily for his hypertension.

## 2024-01-30 NOTE — Assessment & Plan Note (Signed)
 [name] reports intermittent compressive chest pain lasting about 15 minutes every two weeks, typically occurring during walking. The pain gradually improve with rest.  Assessment: Stable Angina Plan: Refer to cardiology for cardiac stress test to evaluate chest pain.

## 2024-02-09 ENCOUNTER — Emergency Department (HOSPITAL_COMMUNITY)

## 2024-02-09 ENCOUNTER — Encounter (HOSPITAL_COMMUNITY): Payer: Self-pay

## 2024-02-09 ENCOUNTER — Other Ambulatory Visit: Payer: Self-pay

## 2024-02-09 ENCOUNTER — Emergency Department (HOSPITAL_COMMUNITY): Admission: EM | Admit: 2024-02-09 | Discharge: 2024-02-09 | Disposition: A | Discharge status: Home / Self Care

## 2024-02-09 DIAGNOSIS — Z79899 Other long term (current) drug therapy: Secondary | ICD-10-CM | POA: Insufficient documentation

## 2024-02-09 DIAGNOSIS — I10 Essential (primary) hypertension: Secondary | ICD-10-CM | POA: Diagnosis not present

## 2024-02-09 DIAGNOSIS — J449 Chronic obstructive pulmonary disease, unspecified: Secondary | ICD-10-CM | POA: Insufficient documentation

## 2024-02-09 DIAGNOSIS — E119 Type 2 diabetes mellitus without complications: Secondary | ICD-10-CM | POA: Diagnosis not present

## 2024-02-09 DIAGNOSIS — R519 Headache, unspecified: Secondary | ICD-10-CM | POA: Insufficient documentation

## 2024-02-09 DIAGNOSIS — Z7951 Long term (current) use of inhaled steroids: Secondary | ICD-10-CM | POA: Diagnosis not present

## 2024-02-09 LAB — COMPREHENSIVE METABOLIC PANEL WITH GFR
ALT: 21 U/L (ref 0–44)
AST: 17 U/L (ref 15–41)
Albumin: 3.7 g/dL (ref 3.5–5.0)
Alkaline Phosphatase: 72 U/L (ref 38–126)
Anion gap: 11 (ref 5–15)
BUN: 8 mg/dL (ref 8–23)
CO2: 23 mmol/L (ref 22–32)
Calcium: 8.9 mg/dL (ref 8.9–10.3)
Chloride: 104 mmol/L (ref 98–111)
Creatinine, Ser: 0.99 mg/dL (ref 0.61–1.24)
GFR, Estimated: >60 mL/min (ref >60)
Glucose, Bld: 116 mg/dL — ABNORMAL HIGH (ref 70–99)
Potassium: 3.6 mmol/L (ref 3.5–5.1)
Sodium: 138 mmol/L (ref 135–145)
Total Bilirubin: 0.6 mg/dL (ref 0.0–1.2)
Total Protein: 8.3 g/dL — ABNORMAL HIGH (ref 6.5–8.1)

## 2024-02-09 LAB — URINALYSIS, ROUTINE W REFLEX MICROSCOPIC
Bilirubin Urine: NEGATIVE
Glucose, UA: NEGATIVE mg/dL
Hgb urine dipstick: NEGATIVE
Ketones, ur: NEGATIVE mg/dL
Nitrite: NEGATIVE
Protein, ur: 100 mg/dL — AB
Specific Gravity, Urine: >1.046 — ABNORMAL HIGH (ref 1.005–1.030)
pH: 6 (ref 5.0–8.0)

## 2024-02-09 LAB — CBC
HCT: 46.8 % (ref 39.0–52.0)
Hemoglobin: 15.1 g/dL (ref 13.0–17.0)
MCH: 28.5 pg (ref 26.0–34.0)
MCHC: 32.3 g/dL (ref 30.0–36.0)
MCV: 88.3 fL (ref 80.0–100.0)
Platelets: 289 K/uL (ref 150–400)
RBC: 5.3 MIL/uL (ref 4.22–5.81)
RDW: 14.1 % (ref 11.5–15.5)
WBC: 7.1 K/uL (ref 4.0–10.5)
nRBC: 0 % (ref 0.0–0.2)

## 2024-02-09 LAB — SEDIMENTATION RATE: Sed Rate: 20 mm/h — ABNORMAL HIGH (ref 0–16)

## 2024-02-09 LAB — C-REACTIVE PROTEIN: CRP: 1 mg/dL — ABNORMAL HIGH (ref <1.0)

## 2024-02-09 LAB — CBG MONITORING, ED: Glucose-Capillary: 126 mg/dL — ABNORMAL HIGH (ref 70–99)

## 2024-02-09 MED ORDER — BUTALBITAL-APAP-CAFFEINE 50-325-40 MG PO TABS: 1.0000 | ORAL_TABLET | Freq: Four times a day (QID) | ORAL | 0 refills | Status: AC | PRN

## 2024-02-09 MED ORDER — DIPHENHYDRAMINE HCL 50 MG/ML IJ SOLN
25.0000 mg | Freq: Once | INTRAMUSCULAR | Status: AC
  Administered 2024-02-09: 25 mg via INTRAVENOUS
  Filled 2024-02-09: qty 1

## 2024-02-09 MED ORDER — PROCHLORPERAZINE EDISYLATE 10 MG/2ML IJ SOLN
10.0000 mg | Freq: Once | INTRAMUSCULAR | Status: AC
  Administered 2024-02-09: 10 mg via INTRAVENOUS
  Filled 2024-02-09: qty 2

## 2024-02-09 MED ORDER — DEXAMETHASONE SODIUM PHOSPHATE 10 MG/ML IJ SOLN
10.0000 mg | Freq: Once | INTRAMUSCULAR | Status: AC
  Administered 2024-02-09: 10 mg via INTRAVENOUS
  Filled 2024-02-09: qty 1

## 2024-02-09 MED ORDER — ACETAMINOPHEN 325 MG PO TABS
650.0000 mg | ORAL_TABLET | Freq: Once | ORAL | Status: AC
  Administered 2024-02-09: 650 mg via ORAL
  Filled 2024-02-09: qty 2

## 2024-02-09 MED ORDER — MAGNESIUM SULFATE 2 GM/50ML IV SOLN
2.0000 g | Freq: Once | INTRAVENOUS | Status: AC
  Administered 2024-02-09: 2 g via INTRAVENOUS
  Filled 2024-02-09: qty 50

## 2024-02-09 MED ORDER — IOHEXOL 350 MG/ML SOLN
75.0000 mL | Freq: Once | INTRAVENOUS | Status: AC | PRN
  Administered 2024-02-09: 75 mL via INTRAVENOUS

## 2024-02-09 NOTE — ED Notes (Signed)
 Pt returned from CT

## 2024-02-09 NOTE — ED Provider Notes (Addendum)
 Patient awaiting CTV CT of the head to evaluate for headache.  He has a history of COPD diabetes hypertension high cholesterol.  He is had headache for the last couple days but denies any fevers or chills.  He denies any vision changes.  He had a sed rate of 20 CRP of 1 but otherwise lab work was unremarkable.  No white count no fever.  Case was discussed with neurology.  Ultimately they recommended CT venogram CTA of the head which were unremarkable.  Headaches greatly improved.  Clinically this does not seem like a temporal arteritis.  Is not having any jaw pain or jaw claudication.  He is having pain all throughout his head.  My suspicion is that this is likely a migraine.  Will prescribe Fioricet for any breakthrough pain and refer him to neurology and have him follow-up with his primary care doctor.  Told to return if symptoms worsen.  There is no concern for infectious process including meningitis.  This chart was dictated using voice recognition software.  Despite best efforts to proofread,  errors can occur which can change the documentation meaning.    Ruthe Cornet, DO 02/09/24 1726    Ruthe Cornet, DO 02/09/24 1727

## 2024-02-09 NOTE — ED Provider Notes (Addendum)
 [facility] EMERGENCY DEPARTMENT AT [facility] Provider Note   CSN: [id number] Arrival date & time: 02/09/24  1047     Patient presents with: Headache   [name] is a 63 y.o. male.   63 year old male with past medical history of COPD, diabetes, hypertension, and hyperlipidemia presenting to the emergency department today with headache now over the past 3 days.  Patient reports that he has had a headache that is gradually worsened over the past 3 days.  He states that he does not normally get headaches.  Denies any associated fevers, chills, or neck pain/stiffness.  He states that the headache is global in character.  He has also been having some blurred vision now during this time and even prior to this that occurred a few days before.  Denies any associated jaw claudic he did report some pain in his left arm as well as some tingling in the fingers on his left hand but this has been going on now for the past month or 2 and seems to be more of a chronic issue and is not associated with the headache that he came in with today.  Ation.  He denies any focal weakness, numbness, or tingling.  He came to the ER today for further evaluation due to these ongoing symptoms.   Headache      Prior to Admission medications   Medication Sig Start Date End Date Taking? Authorizing Provider  acetaminophen  (TYLENOL ) 325 MG tablet Take 2 tablets (650 mg total) by mouth every 6 (six) hours as needed for mild pain (or Fever >/= 101). 02/10/23   Masters, Katie, DO  albuterol  (PROVENTIL ) (2.5 MG/3ML) 0.083% nebulizer solution Take 3 mLs (2.5 mg total) by nebulization every 6 (six) hours as needed for wheezing or shortness of breath. 10/16/23   Stephanie Freund, MD  atorvastatin  (LIPITOR) 40 MG tablet Take 1 tablet (40 mg total) by mouth daily. 10/16/23 10/15/24  Stephanie Freund, MD  azelastine  (ASTELIN ) 0.1 % nasal spray Place 1 spray into both nostrils 2 (two) times daily. Use in each nostril as directed  10/16/23   Stephanie Freund, MD  budeson-glycopyrrolate-formoterol  (BREZTRI  AEROSPHERE) 160-9-4.8 MCG/ACT AERO Inhale 2 puffs into the lungs in the morning and at bedtime. 10/16/23   Stephanie Freund, MD  Capsaicin  0.1 % CREA Apply 1 Units topically 3 (three) times daily as needed. 11/08/23   Elnora Ip, MD  DULoxetine  (CYMBALTA ) 30 MG capsule TAKE 1 CAPSULE(30 MG) BY MOUTH AT BEDTIME 01/19/24   Jolaine Pac, DO  fluticasone  (FLONASE ) 50 MCG/ACT nasal spray Place 1 spray into both nostrils daily. 10/16/23   Stephanie Freund, MD  guaiFENesin  (MUCINEX ) 600 MG 12 hr tablet Take 2 tablets (1,200 mg total) by mouth 2 (two) times daily. 02/10/23   Masters, Katie, DO  loratadine  (CLARITIN ) 10 MG tablet Take 1 tablet (10 mg total) by mouth daily. 01/16/23   Will Almarie MATSU, MD  losartan  (COZAAR ) 100 MG tablet Take 1 tablet (100 mg total) by mouth daily. 10/26/23   Stephanie Freund, MD  montelukast  (SINGULAIR ) 10 MG tablet Take 1 tablet (10 mg total) by mouth at bedtime. 01/29/24   Azadegan, Maryam, MD  polyethylene glycol (MIRALAX  / GLYCOLAX ) 17 g packet Take 17 g by mouth daily as needed for mild constipation. 01/15/23   Will Almarie MATSU, MD  predniSONE  (DELTASONE ) 10 MG tablet Take 4 tablets daily for 3 days 6/18 to 6/20 Take 3 tablets daily for the next 3 days 6/21 to 6/23 Take  2 tablets daily for the following 3 days 6/24 to 6/26 1 tablet daily till done 01/16/23   Will Almarie MATSU, MD  pregabalin  (LYRICA ) 100 MG capsule TAKE 1 CAPSULE(100 MG) BY MOUTH THREE TIMES DAILY 06/12/23   Stephanie Freund, MD  Semaglutide  (RYBELSUS ) 3 MG TABS Take 1 tablet (3 mg total) by mouth daily. Take 30 minutes before breakfast. 01/29/24   Renne Homans, MD  tamsulosin  (FLOMAX ) 0.4 MG CAPS capsule Take 1 capsule (0.4 mg total) by mouth daily after breakfast. 11/08/23   Elnora Ip, MD  VENTOLIN  HFA 108 (90 Base) MCG/ACT inhaler Inhale 2 puffs into the lungs every 6 (six) hours as needed for wheezing or  shortness of breath. 10/16/23   Stephanie Freund, MD    Allergies: Aspirin and Nsaids    Review of Systems  Neurological:  Positive for headaches.  All other systems reviewed and are negative.   Updated Vital Signs BP (!) 157/98 (BP Location: Right Arm)   Pulse 75   Temp 98.4 F (36.9 C) (Oral)   Resp 18   Ht 5' 11 (1.803 m)   Wt 111.1 kg   SpO2 92%   BMI 34.17 kg/m   Physical Exam Vitals and nursing note reviewed.   Gen: NAD Eyes: PERRL, EOMI HEENT: no oropharyngeal swelling Neck: trachea midline, no midline tenderness, no step-offs or deformities, no meningismus Resp: clear to auscultation bilaterally Card: RRR, no murmurs, rubs, or gallops Abd: nontender, nondistended Extremities: no calf tenderness, no edema Vascular: 2+ radial pulses bilaterally, 2+ DP pulses bilaterally Skin: no rashes Neuro: Cranial nerves intact, equal strength and sensation throughout bilateral upper and lower extremities with no dysmetria on finger-to-nose testing Psyc: acting appropriately   (all labs ordered are listed, but only abnormal results are displayed) Labs Reviewed  COMPREHENSIVE METABOLIC PANEL WITH GFR - Abnormal; Notable for the following components:      Result Value   Glucose, Bld 116 (*)    Total Protein 8.3 (*)    All other components within normal limits  SEDIMENTATION RATE - Abnormal; Notable for the following components:   Sed Rate 20 (*)    All other components within normal limits  C-REACTIVE PROTEIN - Abnormal; Notable for the following components:   CRP 1.0 (*)    All other components within normal limits  CBG MONITORING, ED - Abnormal; Notable for the following components:   Glucose-Capillary 126 (*)    All other components within normal limits  CBC  URINALYSIS, ROUTINE W REFLEX MICROSCOPIC    EKG: None  Radiology: CT Head Wo Contrast Result Date: 02/09/2024 EXAM: CT HEAD WITHOUT 02/09/2024 12:45:36 PM TECHNIQUE: CT of the head was performed without the  administration of intravenous contrast. Automated exposure control, iterative reconstruction, and/or weight based adjustment of the mA/kV was utilized to reduce the radiation dose to as low as reasonably achievable. COMPARISON: CT sinus 09/16/2022. CLINICAL HISTORY: Headache, increasing frequency or severity. FINDINGS: BRAIN AND VENTRICLES: No acute intracranial hemorrhage. No mass effect or midline shift. No extra-axial fluid collection. Gray-white differentiation is maintained. No hydrocephalus. ORBITS: No acute abnormality. SINUSES AND MASTOIDS: Unchanged severe chronic pansinusitis. SOFT TISSUES AND SKULL: No acute skull fracture. No acute soft tissue abnormality. IMPRESSION: 1. No acute intracranial abnormality. 2. Unchanged severe chronic pansinusitis. Electronically signed by: Ryan Chess MD 02/09/2024 12:49 PM EDT RP Workstation: [id number]     Procedures   Medications Ordered in the ED  prochlorperazine  (COMPAZINE ) injection 10 mg (10 mg Intravenous Given 02/09/24 1226)  diphenhydrAMINE  (BENADRYL )  injection 25 mg (25 mg Intravenous Given 02/09/24 1226)  iohexol  (OMNIPAQUE ) 350 MG/ML injection 75 mL (75 mLs Intravenous Contrast Given 02/09/24 1556)                                    Medical Decision Making 63 year old male with past medical history of COPD, diabetes, hypertension, and hyperlipidemia presenting to the emergency department today with headache.  I will further evaluate the patient here with basic labs as well as an ESR and CRP to screen for temporal arteritis.  Will obtain a CT scan of his head to evaluate for intracranial hemorrhage or mass lesion.  His exam is not consistent with meningitis at this time.  Based on description and duration of his symptoms suspicion for subarachnoid hemorrhage is also low at this time.  I will give the patient Compazine  and Benadryl  here for his headache and reevaluate for ultimate disposition.  The patient's labs were largely reassuring with  exception of very mildly elevated CRP and ESR.  I did call and discussed this with Dr. Jerrie.  She recommends a CT angiogram and CT venogram for further evaluation.  Signed out to oncoming provider with the studies pending.  Amount and/or Complexity of Data Reviewed Labs: ordered. Radiology: ordered.  Risk Prescription drug management.        Final diagnoses:  Generalized headache    ED Discharge Orders     None          Ula Prentice SAUNDERS, MD 02/09/24 1452    Ula Prentice SAUNDERS, MD 02/09/24 1600

## 2024-02-09 NOTE — ED Triage Notes (Signed)
 Pt here for a headache that started 3 days ago. C/O numbness and tingling in left arm for 9 months. Blurred vision comes and goes for a week.

## 2024-02-09 NOTE — Discharge Instructions (Signed)
 Take Fioricet as prescribed.  This medication is sedating so please be careful with its use.  Do not mix alcohol drugs or dangerous activities including driving.  Follow-up with neurology regarding your migraines.  Please return if symptoms worsen.  Follow-up closely with your primary care doctor for further management as well.  You can take an additional 325 mg of Tylenol  every 6 hours as needed for headache as well but Fioricet does have Tylenol  in it.  I also recommend 400 mg ibuprofen every 8 hours as needed as well.

## 2024-03-04 ENCOUNTER — Encounter: Admitting: Student

## 2024-03-21 ENCOUNTER — Telehealth: Payer: Self-pay

## 2024-03-21 NOTE — Telephone Encounter (Signed)
 This pt no showed the following appt and will need to resch his appt in order to have a form completed due to ins requirements:  Called [employer] back to speak with a representative but the line stated they have a high call volume at this time. Unable to hold the line.  Name: Hosteen, Kienast MRN: [id number]  Date: 03/04/2024 Status: No Show  Time: 10:45 AM Length: 30  Visit Type: OPEN ESTABLISHED [726] Copay: $4.00  Provider: Celestina Czar, MD Department: [facility]  Referring Provider: BERNADINE MANOS       Copied from CRM [phone]. Topic: General - Other >> Mar 21, 2024  8:36 AM Carmell SAUNDERS wrote: Reason for CRM: Danielle with [facility] called to check the status of the faxed rx received for incontinence supplies for this patient. Waiting for the signature of a pcp with npi and date. Please follow up [phone]

## 2024-04-25 ENCOUNTER — Telehealth: Payer: Self-pay

## 2024-04-25 NOTE — Telephone Encounter (Signed)
 4 attempts   Unable to reach patient  tele #  is saying unable to call right now

## 2024-05-15 ENCOUNTER — Ambulatory Visit: Admitting: Diagnostic Neuroimaging

## 2024-05-15 ENCOUNTER — Encounter: Payer: Self-pay | Admitting: Diagnostic Neuroimaging

## 2024-06-21 ENCOUNTER — Telehealth: Payer: Self-pay | Admitting: Student

## 2024-06-21 NOTE — Telephone Encounter (Signed)
 Opened in error
